# Patient Record
Sex: Male | Born: 1965 | Race: White | Hispanic: No | Marital: Married | Smoking: Never smoker
Health system: Southern US, Community
[De-identification: ages and names within clinical notes are randomized; demographics above are authoritative.]

## PROBLEM LIST (undated history)

## (undated) DIAGNOSIS — F419 Anxiety disorder, unspecified: Secondary | ICD-10-CM

## (undated) DIAGNOSIS — I10 Essential (primary) hypertension: Secondary | ICD-10-CM

## (undated) DIAGNOSIS — F418 Other specified anxiety disorders: Secondary | ICD-10-CM

## (undated) DIAGNOSIS — D696 Thrombocytopenia, unspecified: Secondary | ICD-10-CM

## (undated) DIAGNOSIS — J189 Pneumonia, unspecified organism: Secondary | ICD-10-CM

## (undated) DIAGNOSIS — F1010 Alcohol abuse, uncomplicated: Secondary | ICD-10-CM

## (undated) HISTORY — DX: Anxiety disorder, unspecified: F41.9

## (undated) HISTORY — PX: NO PAST SURGERIES: SHX2092

---

## 2016-01-22 ENCOUNTER — Inpatient Hospital Stay
Admission: EM | Admit: 2016-01-22 | Discharge: 2016-01-23 | DRG: 194 | Disposition: A | Discharge status: Home / Self Care | Payer: BLUE CROSS/BLUE SHIELD | Attending: Internal Medicine | Admitting: Internal Medicine

## 2016-01-22 ENCOUNTER — Emergency Department: Payer: BLUE CROSS/BLUE SHIELD

## 2016-01-22 ENCOUNTER — Encounter: Payer: Self-pay | Admitting: *Deleted

## 2016-01-22 DIAGNOSIS — Z79899 Other long term (current) drug therapy: Secondary | ICD-10-CM | POA: Diagnosis not present

## 2016-01-22 DIAGNOSIS — I160 Hypertensive urgency: Secondary | ICD-10-CM | POA: Diagnosis present

## 2016-01-22 DIAGNOSIS — F10230 Alcohol dependence with withdrawal, uncomplicated: Secondary | ICD-10-CM | POA: Diagnosis present

## 2016-01-22 DIAGNOSIS — Z23 Encounter for immunization: Secondary | ICD-10-CM

## 2016-01-22 DIAGNOSIS — W19XXXA Unspecified fall, initial encounter: Secondary | ICD-10-CM | POA: Diagnosis present

## 2016-01-22 DIAGNOSIS — F10939 Alcohol use, unspecified with withdrawal, unspecified: Secondary | ICD-10-CM | POA: Diagnosis present

## 2016-01-22 DIAGNOSIS — F17290 Nicotine dependence, other tobacco product, uncomplicated: Secondary | ICD-10-CM | POA: Diagnosis present

## 2016-01-22 DIAGNOSIS — S2242XA Multiple fractures of ribs, left side, initial encounter for closed fracture: Secondary | ICD-10-CM | POA: Diagnosis present

## 2016-01-22 DIAGNOSIS — I10 Essential (primary) hypertension: Secondary | ICD-10-CM | POA: Diagnosis present

## 2016-01-22 DIAGNOSIS — J189 Pneumonia, unspecified organism: Secondary | ICD-10-CM | POA: Diagnosis present

## 2016-01-22 DIAGNOSIS — R0602 Shortness of breath: Secondary | ICD-10-CM | POA: Diagnosis present

## 2016-01-22 DIAGNOSIS — F10930 Alcohol use, unspecified with withdrawal, uncomplicated: Secondary | ICD-10-CM | POA: Diagnosis present

## 2016-01-22 DIAGNOSIS — F10239 Alcohol dependence with withdrawal, unspecified: Secondary | ICD-10-CM | POA: Diagnosis present

## 2016-01-22 DIAGNOSIS — S2239XA Fracture of one rib, unspecified side, initial encounter for closed fracture: Secondary | ICD-10-CM | POA: Diagnosis present

## 2016-01-22 HISTORY — DX: Essential (primary) hypertension: I10

## 2016-01-22 LAB — COMPREHENSIVE METABOLIC PANEL
ALBUMIN: 4.6 g/dL (ref 3.5–5.0)
ALT: 66 U/L — ABNORMAL HIGH (ref 17–63)
ANION GAP: 10 (ref 5–15)
AST: 46 U/L — ABNORMAL HIGH (ref 15–41)
Alkaline Phosphatase: 73 U/L (ref 38–126)
BILIRUBIN TOTAL: 0.9 mg/dL (ref 0.3–1.2)
BUN: 5 mg/dL — ABNORMAL LOW (ref 6–20)
CHLORIDE: 99 mmol/L — AB (ref 101–111)
CO2: 26 mmol/L (ref 22–32)
Calcium: 9.1 mg/dL (ref 8.9–10.3)
Creatinine, Ser: 0.64 mg/dL (ref 0.61–1.24)
GFR calc Af Amer: >60 mL/min (ref >60)
Glucose, Bld: 113 mg/dL — ABNORMAL HIGH (ref 65–99)
POTASSIUM: 3.9 mmol/L (ref 3.5–5.1)
Sodium: 135 mmol/L (ref 135–145)
TOTAL PROTEIN: 8.2 g/dL — AB (ref 6.5–8.1)

## 2016-01-22 LAB — CBC WITH DIFFERENTIAL/PLATELET
BASOS ABS: 0.1 10*3/uL (ref 0–0.1)
BASOS PCT: 1 %
Eosinophils Absolute: 0.7 10*3/uL (ref 0–0.7)
Eosinophils Relative: 12 %
HEMATOCRIT: 43.4 % (ref 40.0–52.0)
HEMOGLOBIN: 15 g/dL (ref 13.0–18.0)
LYMPHS PCT: 20 %
Lymphs Abs: 1.1 10*3/uL (ref 1.0–3.6)
MCH: 31.5 pg (ref 26.0–34.0)
MCHC: 34.4 g/dL (ref 32.0–36.0)
MCV: 91.5 fL (ref 80.0–100.0)
Monocytes Absolute: 0.5 10*3/uL (ref 0.2–1.0)
Monocytes Relative: 9 %
NEUTROS ABS: 3.4 10*3/uL (ref 1.4–6.5)
NEUTROS PCT: 58 %
Platelets: 169 10*3/uL (ref 150–440)
RBC: 4.74 MIL/uL (ref 4.40–5.90)
RDW: 13.2 % (ref 11.5–14.5)
WBC: 5.8 10*3/uL (ref 3.8–10.6)

## 2016-01-22 LAB — URINE DRUG SCREEN, QUALITATIVE (ARMC ONLY)
Amphetamines, Ur Screen: NOT DETECTED
BARBITURATES, UR SCREEN: NOT DETECTED
BENZODIAZEPINE, UR SCRN: NOT DETECTED
CANNABINOID 50 NG, UR ~~LOC~~: NOT DETECTED
Cocaine Metabolite,Ur ~~LOC~~: NOT DETECTED
MDMA (Ecstasy)Ur Screen: NOT DETECTED
Methadone Scn, Ur: NOT DETECTED
Opiate, Ur Screen: NOT DETECTED
PHENCYCLIDINE (PCP) UR S: NOT DETECTED
TRICYCLIC, UR SCREEN: NOT DETECTED

## 2016-01-22 LAB — INFLUENZA PANEL BY PCR (TYPE A & B)
INFLAPCR: NEGATIVE
INFLBPCR: NEGATIVE

## 2016-01-22 LAB — TROPONIN I

## 2016-01-22 LAB — BRAIN NATRIURETIC PEPTIDE: B NATRIURETIC PEPTIDE 5: 14 pg/mL (ref 0.0–100.0)

## 2016-01-22 LAB — ETHANOL

## 2016-01-22 LAB — LACTIC ACID, PLASMA: Lactic Acid, Venous: 0.9 mmol/L (ref 0.5–1.9)

## 2016-01-22 MED ORDER — IPRATROPIUM-ALBUTEROL 0.5-2.5 (3) MG/3ML IN SOLN
3.0000 mL | Freq: Once | RESPIRATORY_TRACT | Status: AC
  Administered 2016-01-22: 3 mL via RESPIRATORY_TRACT
  Filled 2016-01-22: qty 3

## 2016-01-22 MED ORDER — ENOXAPARIN SODIUM 40 MG/0.4ML ~~LOC~~ SOLN
40.0000 mg | SUBCUTANEOUS | Status: DC
  Administered 2016-01-22: 40 mg via SUBCUTANEOUS
  Filled 2016-01-22: qty 0.4

## 2016-01-22 MED ORDER — LORAZEPAM 2 MG/ML IJ SOLN: 1.0000 mg | Freq: Four times a day (QID) | INTRAMUSCULAR | Status: DC | PRN

## 2016-01-22 MED ORDER — CEFTRIAXONE SODIUM-DEXTROSE 1-3.74 GM-% IV SOLR: 1.0000 g | INTRAVENOUS | Status: DC

## 2016-01-22 MED ORDER — IOPAMIDOL (ISOVUE-370) INJECTION 76%
75.0000 mL | Freq: Once | INTRAVENOUS | Status: AC | PRN
  Administered 2016-01-22: 75 mL via INTRAVENOUS

## 2016-01-22 MED ORDER — CEFTRIAXONE SODIUM-DEXTROSE 1-3.74 GM-% IV SOLR
1.0000 g | Freq: Once | INTRAVENOUS | Status: AC
  Administered 2016-01-22: 1 g via INTRAVENOUS
  Filled 2016-01-22: qty 50

## 2016-01-22 MED ORDER — ACETAMINOPHEN 325 MG PO TABS: 650.0000 mg | ORAL_TABLET | Freq: Four times a day (QID) | ORAL | Status: DC | PRN

## 2016-01-22 MED ORDER — DEXTROSE 5 % IV SOLN: 1.0000 g | INTRAVENOUS | Status: DC

## 2016-01-22 MED ORDER — ONDANSETRON HCL 4 MG/2ML IJ SOLN: 4.0000 mg | Freq: Four times a day (QID) | INTRAMUSCULAR | Status: DC | PRN

## 2016-01-22 MED ORDER — THIAMINE HCL 100 MG/ML IJ SOLN: 100.0000 mg | Freq: Every day | INTRAMUSCULAR | Status: DC

## 2016-01-22 MED ORDER — LORAZEPAM 2 MG PO TABS: 0.0000 mg | ORAL_TABLET | Freq: Two times a day (BID) | ORAL | Status: DC

## 2016-01-22 MED ORDER — ADULT MULTIVITAMIN W/MINERALS CH
1.0000 | ORAL_TABLET | Freq: Every day | ORAL | Status: DC
Start: 2016-01-22 — End: 2016-01-23
  Administered 2016-01-22 – 2016-01-23 (×2): 1 via ORAL
  Filled 2016-01-22 (×2): qty 1

## 2016-01-22 MED ORDER — IPRATROPIUM-ALBUTEROL 0.5-2.5 (3) MG/3ML IN SOLN
3.0000 mL | RESPIRATORY_TRACT | Status: DC
  Administered 2016-01-22 – 2016-01-23 (×3): 3 mL via RESPIRATORY_TRACT
  Filled 2016-01-22 (×4): qty 3

## 2016-01-22 MED ORDER — AZITHROMYCIN 250 MG PO TABS
250.0000 mg | ORAL_TABLET | Freq: Every day | ORAL | Status: DC
  Administered 2016-01-23: 250 mg via ORAL
  Filled 2016-01-22: qty 1

## 2016-01-22 MED ORDER — AZITHROMYCIN 500 MG IV SOLR
500.0000 mg | Freq: Once | INTRAVENOUS | Status: AC
  Administered 2016-01-22: 500 mg via INTRAVENOUS
  Filled 2016-01-22: qty 500

## 2016-01-22 MED ORDER — LORAZEPAM 1 MG PO TABS: 1.0000 mg | ORAL_TABLET | Freq: Four times a day (QID) | ORAL | Status: DC | PRN

## 2016-01-22 MED ORDER — ACETAMINOPHEN 650 MG RE SUPP: 650.0000 mg | Freq: Four times a day (QID) | RECTAL | Status: DC | PRN

## 2016-01-22 MED ORDER — OXYCODONE HCL 5 MG PO TABS: 5.0000 mg | ORAL_TABLET | ORAL | Status: DC | PRN

## 2016-01-22 MED ORDER — LORAZEPAM 2 MG/ML IJ SOLN
1.0000 mg | Freq: Once | INTRAMUSCULAR | Status: AC
  Administered 2016-01-22: 1 mg via INTRAVENOUS
  Filled 2016-01-22: qty 1

## 2016-01-22 MED ORDER — METHYLPREDNISOLONE SODIUM SUCC 125 MG IJ SOLR
125.0000 mg | Freq: Once | INTRAMUSCULAR | Status: AC
  Administered 2016-01-22: 125 mg via INTRAVENOUS
  Filled 2016-01-22: qty 2

## 2016-01-22 MED ORDER — VITAMIN B-1 100 MG PO TABS
100.0000 mg | ORAL_TABLET | Freq: Every day | ORAL | Status: DC
Start: 2016-01-22 — End: 2016-01-23
  Administered 2016-01-22 – 2016-01-23 (×2): 100 mg via ORAL
  Filled 2016-01-22 (×3): qty 1

## 2016-01-22 MED ORDER — ONDANSETRON HCL 4 MG PO TABS: 4.0000 mg | ORAL_TABLET | Freq: Four times a day (QID) | ORAL | Status: DC | PRN

## 2016-01-22 MED ORDER — LORAZEPAM 2 MG/ML IJ SOLN
1.0000 mg | Freq: Once | INTRAMUSCULAR | Status: AC
Start: 2016-01-22 — End: 2016-01-22
  Administered 2016-01-22: 1 mg via INTRAVENOUS
  Filled 2016-01-22: qty 1

## 2016-01-22 MED ORDER — FOLIC ACID 1 MG PO TABS
1.0000 mg | ORAL_TABLET | Freq: Every day | ORAL | Status: DC
  Administered 2016-01-22 – 2016-01-23 (×2): 1 mg via ORAL
  Filled 2016-01-22 (×2): qty 1

## 2016-01-22 MED ORDER — SODIUM CHLORIDE 0.9% FLUSH
3.0000 mL | Freq: Two times a day (BID) | INTRAVENOUS | Status: DC
  Administered 2016-01-22 – 2016-01-23 (×3): 3 mL via INTRAVENOUS

## 2016-01-22 MED ORDER — LORAZEPAM 2 MG PO TABS
0.0000 mg | ORAL_TABLET | Freq: Four times a day (QID) | ORAL | Status: DC
Start: 2016-01-22 — End: 2016-01-23
  Administered 2016-01-23: 1 mg via ORAL
  Filled 2016-01-22: qty 1

## 2016-01-22 MED ORDER — INFLUENZA VAC SPLIT QUAD 0.5 ML IM SUSY
0.5000 mL | PREFILLED_SYRINGE | INTRAMUSCULAR | Status: AC
  Administered 2016-01-23: 0.5 mL via INTRAMUSCULAR
  Filled 2016-01-22: qty 0.5

## 2016-01-22 MED ORDER — HYDRALAZINE HCL 20 MG/ML IJ SOLN: 10.0000 mg | INTRAMUSCULAR | Status: DC | PRN

## 2016-01-22 MED ORDER — AMLODIPINE BESYLATE 5 MG PO TABS
5.0000 mg | ORAL_TABLET | Freq: Every day | ORAL | Status: DC
  Administered 2016-01-22 – 2016-01-23 (×2): 5 mg via ORAL
  Filled 2016-01-22 (×2): qty 1

## 2016-01-22 MED ORDER — LORAZEPAM 2 MG/ML IJ SOLN
0.5000 mg | Freq: Once | INTRAMUSCULAR | Status: AC
Start: 2016-01-22 — End: 2016-01-22
  Administered 2016-01-22: 0.5 mg via INTRAVENOUS
  Filled 2016-01-22: qty 1

## 2016-01-22 MED ORDER — METHYLPREDNISOLONE SODIUM SUCC 125 MG IJ SOLR
60.0000 mg | INTRAMUSCULAR | Status: DC
  Administered 2016-01-23: 60 mg via INTRAVENOUS
  Filled 2016-01-22: qty 2

## 2016-01-22 MED ORDER — DEXTROSE 5 % IV SOLN: 1.0000 g | Freq: Once | INTRAVENOUS | Status: DC

## 2016-01-22 NOTE — ED Notes (Signed)
Patient denies pain and is resting comfortably.  

## 2016-01-22 NOTE — H&P (Signed)
[facility] [place] [facility]   PATIENT NAME: Bobby Williams    MR#:  [id number]  DATE OF BIRTH:  1965-02-28   DATE OF ADMISSION:  01/22/2016  PRIMARY CARE PHYSICIAN: No PCP Per Patient   REQUESTING/REFERRING PHYSICIAN: Governor Rooks  CHIEF COMPLAINT:   Chief Complaint  Patient presents with  . Shortness of Breath    HISTORY OF PRESENT ILLNESS:  Bobby Williams  is a 50 y.o. male with a known history of Essential hypertension who is presenting with shortness of breath. She describes having about 1 week duration shortness of breath mainly dyspnea on exertion has been progressive. Associated cough nonproductive denies fevers chills was sent to pathology. He states about 2 months ago did suffer a mechanical fall with rib fracture on the left side.  PAST MEDICAL HISTORY:   Past Medical History:  Diagnosis Date  . Hypertension     PAST SURGICAL HISTORY:  History reviewed. No pertinent surgical history.  SOCIAL HISTORY:   Social History  Substance Use Topics  . Smoking status: Never Smoker  . Smokeless tobacco: Current User  . Alcohol use No    FAMILY HISTORY:   Family History  Problem Relation Age of Onset  . Diabetes Neg Hx     DRUG ALLERGIES:  No Known Allergies  REVIEW OF SYSTEMS:  REVIEW OF SYSTEMS:  CONSTITUTIONAL: Denies fevers, chills, Positive fatigue, weakness.  EYES: Denies blurred vision, double vision, or eye pain.  EARS, NOSE, THROAT: Denies tinnitus, ear pain, hearing loss.  RESPIRATORY: Positive cough, shortness of breath, denies wheezing  CARDIOVASCULAR: Denies chest pain, palpitations, edema.  GASTROINTESTINAL: Denies nausea, vomiting, diarrhea, abdominal pain.  GENITOURINARY: Denies dysuria, hematuria.  ENDOCRINE: Denies nocturia or thyroid problems. HEMATOLOGIC AND LYMPHATIC: Denies easy bruising or bleeding.  SKIN: Denies rash or lesions.  MUSCULOSKELETAL: Denies pain in neck, back, shoulder, knees, hips, or further  arthritic symptoms.  NEUROLOGIC: Denies paralysis, paresthesias.  PSYCHIATRIC: Denies anxiety or depressive symptoms. Otherwise full review of systems performed by me is negative.   MEDICATIONS AT HOME:   Prior to Admission medications   Medication Sig Start Date End Date Taking? Authorizing Provider  albuterol (PROVENTIL HFA;VENTOLIN HFA) 108 (90 Base) MCG/ACT inhaler Inhale 1-2 puffs into the lungs every 4 (four) hours as needed for wheezing or shortness of breath.   Yes Historical Provider, MD  guaiFENesin (MUCINEX) 600 MG 12 hr tablet Take by mouth 2 (two) times daily.   Yes Historical Provider, MD      VITAL SIGNS:  Blood pressure (!) 178/109, pulse (!) 110, temperature 98.2 F (36.8 C), temperature source Oral, resp. rate (!) 22, height 6\' 4"  (1.93 m), weight 99.8 kg (220 lb), SpO2 95 %.  PHYSICAL EXAMINATION:  VITAL SIGNS: Vitals:   01/22/16 1100 01/22/16 1326  BP: (!) 193/104 (!) 178/109  Pulse: (!) 127 (!) 110  Resp: (!) 33 (!) 22  Temp:     GENERAL:50 y.o.male currently in no acute distress.  HEAD: Normocephalic, atraumatic.  EYES: Pupils equal, round, reactive to light. Extraocular muscles intact. No scleral icterus.  MOUTH: Moist mucosal membrane. Dentition intact. No abscess noted.  EAR, NOSE, THROAT: Clear without exudates. No external lesions.  NECK: Supple. No thyromegaly. No nodules. No JVD.  PULMONARY: Coarse breath sounds scattered rhonchi of the left lung fields without wheeze No use of accessory muscles, Good respiratory effort. good air entry bilaterally CHEST: Nontender to palpation.  CARDIOVASCULAR: S1 and S2. Tachycardic No murmurs, rubs, or gallops. No edema.  Pedal pulses 2+ bilaterally.  GASTROINTESTINAL: Soft, nontender, nondistended. No masses. Positive bowel sounds. No hepatosplenomegaly.  MUSCULOSKELETAL: No swelling, clubbing, or edema. Range of motion full in all extremities.  NEUROLOGIC: Cranial nerves II through XII are intact. No gross focal  neurological deficits. Sensation intact. Reflexes intact. Tremulous SKIN: No ulceration, lesions, rashes, or cyanosis. Skin warm and dry. Turgor intact.  PSYCHIATRIC: Mood, affect within normal limits. The patient is awake, alert and oriented x 3. Insight, judgment intact.    LABORATORY PANEL:   CBC  Recent Labs Lab 01/22/16 0844  WBC 5.8  HGB 15.0  HCT 43.4  PLT 169   ------------------------------------------------------------------------------------------------------------------  Chemistries   Recent Labs Lab 01/22/16 0844  NA 135  K 3.9  CL 99*  CO2 26  GLUCOSE 113*  BUN 5*  CREATININE 0.64  CALCIUM 9.1  AST 46*  ALT 66*  ALKPHOS 73  BILITOT 0.9   ------------------------------------------------------------------------------------------------------------------  Cardiac Enzymes  Recent Labs Lab 01/22/16 0844  TROPONINI <0.03   ------------------------------------------------------------------------------------------------------------------  RADIOLOGY:  Dg Chest 2 View  Result Date: 01/22/2016 CLINICAL DATA:  Cold symptoms for 1 month. Worsening shortness of breath. EXAM: CHEST  2 VIEW COMPARISON:  None. FINDINGS: The heart size and mediastinal contours are normal. There is minimal central airway thickening. No hyperinflation, confluent airspace opacity, pleural effusion or pneumothorax demonstrated. There are bilateral nipple shadows. The bones appear unremarkable. IMPRESSION: Minimal central airway thickening suggesting bronchitis. No evidence of pneumonia. Electronically Signed   By: Carey BullocksWilliam  Veazey M.D.   On: 01/22/2016 08:20   Ct Angio Chest Pe W/cm &/or Wo Cm  Result Date: 01/22/2016 CLINICAL DATA:  Shortness of breath and cough EXAM: CT ANGIOGRAPHY CHEST WITH CONTRAST TECHNIQUE: Multidetector CT imaging of the chest was performed using the standard protocol during bolus administration of intravenous contrast. Multiplanar CT image reconstructions and  MIPs were obtained to evaluate the vascular anatomy. CONTRAST:  75 mL Isovue 370 nonionic COMPARISON:  Chest radiograph January 22, 2016 FINDINGS: Cardiovascular: There is no demonstrable pulmonary embolus. There is no thoracic aortic aneurysm or dissection. Pericardium is not appreciably thickened. Visualized great vessels appear unremarkable. There are scattered foci of coronary artery calcification. Mediastinum/Nodes: There is a benign-appearing calcification in the left lobe of the thyroid measuring 7 x 6 mm. Visualized thyroid otherwise appears unremarkable. There are several subcentimeter mediastinal lymph nodes. By size criteria, there is no frank adenopathy the. Lungs/Pleura: There is patchy infiltrate throughout much of the left lower lobe. Several areas of tree-in-bud type appearance is noted in this area. Lungs elsewhere are clear. There is no pleural effusion or pleural thickening. No pneumothorax. Upper abdomen: Visualized upper abdominal structures appear unremarkable. Musculoskeletal: There are recent appearing fractures of the lateral left eighth, ninth, and tenth ribs, nondisplaced. A bony structures appear intact. No blastic or lytic bone lesions. Review of the MIP images confirms the above findings. IMPRESSION: Patchy infiltrate throughout much of the left lower lobe. Recent appearing fractures of the left lateral eighth, ninth, and tenth ribs. No pneumothorax. No demonstrable pulmonary embolus. No apparent adenopathy. There are subcentimeter mediastinal lymph nodes, but no lymph nodes meeting size criteria for pathologic significance. Benign-appearing calcification noted in left thyroid lobe. Electronically Signed   By: Bretta BangWilliam  Woodruff III M.D.   On: 01/22/2016 11:55    EKG:   Orders placed or performed during the hospital encounter of 01/22/16  . ED EKG  . ED EKG  . EKG 12-Lead  . EKG 12-Lead    IMPRESSION AND PLAN:  50 year old Caucasian gentleman history of essential  hypertension presenting with shortness of breath  1. Community acquired pneumonia: Oxygen, breathing treatments, azithromycin/ceftriaxone this is the setting of lung fracture may be pulmonary contusion element, add incentive spirometry  2. Alcohol abuse with withdrawal: Initiate CIWA protocol 3. Hypertensive urgency: Add IV hydralazine    All the records are reviewed and case discussed with ED provider. Management plans discussed with the patient, family and they are in agreement.  CODE STATUS: Full  TOTAL TIME TAKING CARE OF THIS PATIENT: 33 minutes.    Hower,  Mardi MainlandDavid K M.D on 01/22/2016 at 2:30 PM  Between 7am to 6pm - Pager - [phone]  After 6pm: House Pager: - [phone]  [facility]  Office  [phone]  CC: Primary care physician; No PCP Per Patient

## 2016-01-22 NOTE — ED Notes (Signed)
edp at bedside for update.

## 2016-01-22 NOTE — ED Notes (Signed)
Pt was seen by online md for symptoms and given meds via online.

## 2016-01-22 NOTE — Progress Notes (Signed)
CH responded to an OR for AD Education. PT is young and healthy but wants to have documentation in place should something ever happen. CH provided packet and encouraged PT to look through it with his spouse. CH provided conversation and education. Follow-up as needed.

## 2016-01-22 NOTE — ED Provider Notes (Signed)
[facility] Emergency Department Provider Note ____________________________________________   I have reviewed the triage vital signs and the triage nursing note.  HISTORY  Chief Complaint Shortness of Breath   Historian Patient  HPI Bobby Williams is a 50 y.o. male who has not been to a doctor in about a decade, states he dips tobacco but does not smoke cigarettes, or have history of cardiac or pulmonary disease, presents today due to about 2 weeks of cough and shortness of breath.  States did tele-physician about 2 weeks ago and tried nasal spray and sudafed type meds, and then called back and did a week of amoxicillin, completed about 5 days ago.  He's continued to worsen in terms of feeling short of breath especially with exertion and with coughing. Hasn't increased coughing. He tried his kids albuterol nebulizer overnight and had a little bit improvement. He states he's had soreness and points to both sides of his chest which she suspects is from coughing. Denies abdominal pain, nausea, or diarrhea. Denies fevers.  Sounds like he has a history of routine alcohol drinking, states that this weekend was quite heavy.  When I asked him if his trembling is normal at 51 thinks it might be withdrawal, he does indeed state it might be withdrawal.    Past Medical History:  Diagnosis Date  . Hypertension    Has not seen a doctor in over a decade.  There are no active problems to display for this patient.   History reviewed. No pertinent surgical history.  Prior to Admission medications   Not on File    No Known Allergies  No family history on file.  Social History Social History  Substance Use Topics  . Smoking status: Never Smoker  . Smokeless tobacco: Current User  . Alcohol use No    Review of Systems  Constitutional: Negative for fever. Eyes: Negative for visual changes. ENT: Negative for sore throat.  Mild nasal congestion. Cardiovascular:  Negative for pleuritic chest pain, but chest soreness both sides. Respiratory: Positive for shortness of breath. Gastrointestinal: Negative for abdominal pain, vomiting and diarrhea. Genitourinary: Negative for dysuria. Musculoskeletal: Negative for back pain. Skin: Negative for rash. Neurological: Negative for headache. 10 point Review of Systems otherwise negative ____________________________________________   PHYSICAL EXAM:  VITAL SIGNS: ED Triage Vitals  Enc Vitals Group     BP 01/22/16 0722 (!) 188/88     Pulse Rate 01/22/16 0722 (!) 116     Resp 01/22/16 0722 (!) 26     Temp 01/22/16 0722 98.2 F (36.8 C)     Temp Source 01/22/16 0722 Oral     SpO2 01/22/16 0722 94 %     Weight 01/22/16 0723 220 lb (99.8 kg)     Height 01/22/16 0723 6\' 4"  (1.93 m)     Head Circumference --      Peak Flow --      Pain Score --      Pain Loc --      Pain Edu? --      Excl. in GC? --      Constitutional: Alert and oriented. Trembling a little bit when he speaks in his arms had. HEENT   Head: Normocephalic and atraumatic.      Eyes: Conjunctivae are normal. PERRL. Normal extraocular movements.      Ears:         Nose: No congestion/rhinnorhea.   Mouth/Throat: Mucous membranes are moist.  No posterior pharyngeal erythema.  Neck: No stridor. Cardiovascular/Chest: Tachycardic, regular rhythm.  No murmurs, rubs, or gallops. Respiratory: Normal respiratory effort without tachypnea nor retractions. Somewhat decreased breath sounds throughout without rhonchi or obvious wheezing except for if he takes a deep breath in which case he has a bronchospastic cough. Gastrointestinal: Soft. No distention, no guarding, no rebound. Nontender.    Genitourinary/rectal:Deferred Musculoskeletal: Nontender with normal range of motion in all extremities. No joint effusions.  No lower extremity tenderness.  No edema. Neurologic:  Normal speech and language. No gross or focal neurologic deficits are  appreciated. Skin:  Skin is warm, dry and intact. No rash noted. Psychiatric: Mood and affect are normal. Speech and behavior are normal. Patient exhibits appropriate insight and judgment.   ____________________________________________  LABS (pertinent positives/negatives)  Labs Reviewed  COMPREHENSIVE METABOLIC PANEL - Abnormal; Notable for the following:       Result Value   Chloride 99 (*)    Glucose, Bld 113 (*)    BUN 5 (*)    Total Protein 8.2 (*)    AST 46 (*)    ALT 66 (*)    All other components within normal limits  CULTURE, BLOOD (ROUTINE X 2)  CULTURE, BLOOD (ROUTINE X 2)  TROPONIN I  CBC WITH DIFFERENTIAL/PLATELET  LACTIC ACID, PLASMA  BRAIN NATRIURETIC PEPTIDE  INFLUENZA PANEL BY PCR (TYPE A & B, H1N1)  ETHANOL    ____________________________________________    EKG I, Governor Rooksebecca Kweku Stankey, MD, the attending physician have personally viewed and interpreted all ECGs.  103bpm.  Sinus tachy.  Narrow qrs.  Normal axis.  Normal st and t w ____________________________________________  RADIOLOGY All Xrays were viewed by me. Imaging interpreted by Radiologist.  Chest x-ray two-view:  IMPRESSION: Minimal central airway thickening suggesting bronchitis. No evidence of pneumonia.  CT chest with contrast for PE:  IMPRESSION: Patchy infiltrate throughout much of the left lower lobe. Recent appearing fractures of the left lateral eighth, ninth, and tenth ribs. No pneumothorax.  No demonstrable pulmonary embolus. No apparent adenopathy. There are subcentimeter mediastinal lymph nodes, but no lymph nodes meeting size criteria for pathologic significance. Benign-appearing calcification noted in left thyroid lobe.   __________________________________________  PROCEDURES  Procedure(s) performed: None  Critical Care performed: None  ____________________________________________   ED COURSE / ASSESSMENT AND PLAN  Pertinent labs & imaging results that were  available during my care of the patient were reviewed by me and considered in my medical decision making (see chart for details).   Bobby Williams is here for shortness of breath and cough and has bronchospasm on exam. Symptoms have been worsening over a period of about 2 weeks now. Of note, he is trembling and appears to be like he may have symptoms consistent with alcohol withdrawal.  He thinks it's possible, I discussed giving him a dose of Ativan right now.  In terms of the bronchospasm, I'm going to give him a DuoNeb treatment, and a workup for shortness of breath what I suspect to be probably pulmonary/upper respiratory infection with bronchospasm, but given that he has not been followed by a doctor in quite some time, will certainly also evaluate for cardiac causes shortness of breath as well.   Reeval 11am- still short of breath and very bronchospastic.  I suspect bronchitis and bronchospasm as most likely cause.  However, recommend r/o pe with CT.  Still tachy around 100s.   Question this due to withdrawal or breathing issues.  He states that by far its the breathing issues that are the  problem.  As such, will do ct pe, additional duoneb, solumedrol and likely add antibiotic with sputum production here really thick and yellow.  CT scan shows no PE, but infiltrates in the left lung, and given his cough, sputum production, and dyspnea I am to cover him for community acquired pneumonia with Rocephin and azithromycin. At this point suspicion for sepsis is low with no hypotension or elevated lactate, however he does have elevated heart rate that I suspect is more due to alcohol withdrawal, but will place on sepsis pathway for possible SIRS.  The acute appearing fractures were discussed with the patient and he states he actually had a fall 3 months ago, but he thought they were healed and then during the coughing fits here over the last week or so they seem to be reinjured.  O2 sat 92% at the  lowest, but given the tachycardia, persistent symptoms, I did discuss with hospitalist for admission.  History of trembling, with elevated heart rate I am going to give him another milligram of Ativan for suspected symptoms in association with withdrawal from alcohol.   CONSULTATIONS:  Hospitalist for admission.   Patient / Family / Caregiver informed of clinical course, medical decision-making process, and agree with plan.     ___________________________________________   FINAL CLINICAL IMPRESSION(S) / ED DIAGNOSES   Final diagnoses:  Multiple fractures of ribs, left side, initial encounter for closed fracture  Alcohol withdrawal syndrome without complication (HCC)  Community acquired pneumonia of left lung, unspecified part of lung              Note: This dictation was prepared with Office managerDragon dictation. Any transcriptional errors that result from this process are unintentional    Governor Rooksebecca Lilyauna Miedema, MD 01/22/16 [phone]

## 2016-01-22 NOTE — ED Triage Notes (Signed)
States cold symptoms for 1 month, states he took sudafed and abx but states no relief, states worsening SOB and feels as if it is in his lungs, states cream productive cough

## 2016-01-22 NOTE — ED Notes (Signed)
Wife at bedside. Awaiting CT results.

## 2016-01-23 MED ORDER — LEVOFLOXACIN 500 MG PO TABS: 500.0000 mg | ORAL_TABLET | Freq: Every day | ORAL | 0 refills | Status: DC

## 2016-01-23 MED ORDER — ALBUTEROL SULFATE HFA 108 (90 BASE) MCG/ACT IN AERS: 1.0000 | INHALATION_SPRAY | RESPIRATORY_TRACT | 0 refills | Status: DC | PRN

## 2016-01-23 MED ORDER — IPRATROPIUM-ALBUTEROL 0.5-2.5 (3) MG/3ML IN SOLN: 3.0000 mL | RESPIRATORY_TRACT | Status: DC | PRN

## 2016-01-23 MED ORDER — PREMIER PROTEIN SHAKE: 11.0000 [oz_av] | Freq: Two times a day (BID) | ORAL | Status: DC

## 2016-01-23 MED ORDER — AMLODIPINE BESYLATE 5 MG PO TABS: 5.0000 mg | ORAL_TABLET | Freq: Every day | ORAL | 0 refills | Status: DC

## 2016-01-23 NOTE — Progress Notes (Signed)
   [place] [facility] [address] [place], [place] [address]  January 23, 2016  Patient:  Bobby Williams Date of Birth: 12/30/1965 Date of Visit:  01/22/2016  To Whom it May Concern:  Please excuse Ria BushMark C Cooter from work from 01/22/2016 until 01/23/16 as he was admitted to the [facility] for medical treatment and has been receiving appropriate care. He may return to work on 01/25/16, sooner if he feels he is able to return sooner than this date.      Please don't hesitate to contact me with questions or concerns by calling  [phone] and asking them to page me directly.   Marge Duncansave Christen Bedoya, MD

## 2016-01-23 NOTE — Progress Notes (Signed)
Initial Nutrition Assessment  DOCUMENTATION CODES:   Not applicable  INTERVENTION:   Premier Protein BID- each serving provides 160kcal and 30g protein.  NUTRITION DIAGNOSIS:   Inadequate oral intake related to poor appetite as evidenced by per patient/family report.  GOAL:   Patient will meet greater than or equal to 90% of their needs  MONITOR:   PO intake, Supplement acceptance  REASON FOR ASSESSMENT:   Malnutrition Screening Tool    ASSESSMENT:   50 y.o. male who has not been to a doctor in about a decade, states he dips tobacco but does not smoke cigarettes, or have history of cardiac or pulmonary disease, presents today due to about 2 weeks of cough and shortness of breath. Now admitted for pneumonia.    Met with pt in room today. Pt reports poor appetite and PO intake for 1 month pta. Pt currently eating 75% meals. Pt reports that he had intentionally lost from 260lbs down to 240lbs; but then he lost 10 more pounds in setting of pneumonia. Pt reports that he has lost 10lbs(4%) over the past month. This is not significant. Talked to pt about the importance of adequate protein intake during times of illness. Will order PP per pt request.   Medications reviewed and include: zithromax, ceftriaxone, folic acid, lovenox, MVI, thiamine   Labs reviewed: Cl 99(L), BUN 5(L), AST 46(H), ALT 66(H)  Nutrition-Focused physical exam completed. Findings are no fat depletion, no muscle depletion, and no edema.   Diet Order:  Diet regular Room service appropriate? Yes; Fluid consistency: Thin  Skin:  Reviewed, no issues  Last BM:  12/26  Height:   Ht Readings from Last 1 Encounters:  01/22/16 '6\' 4"'$  (1.93 m)    Weight:   Wt Readings from Last 1 Encounters:  01/22/16 231 lb 12.8 oz (105.1 kg)    Ideal Body Weight:  91.8 kg  BMI:  Body mass index is 28.22 kg/m.  Estimated Nutritional Needs:   Kcal:  2300-2700kcal/day   Protein:  105-126g/day   Fluid:  >2.3L/day    EDUCATION NEEDS:   No education needs identified at this time  Koleen Distance, RD, LDN Pager #[phone]

## 2016-01-23 NOTE — Progress Notes (Signed)
Pt is being discharged home. Discharge papers given and explained to pt. Pt verbalized understanding. Meds and F/U appointment reviewed with pt. Pt declined to be escorted on a wheelchair.

## 2016-01-23 NOTE — Care Management (Signed)
Admitted to [facility] with the diagnosis of alcohol withdrawal. Lives with wife, Bobby Williams [phone]). Works at [employer].Independent of all basic activities of daily living.  Private insurance. No primary care physician. States he calls [facility] through his insurance and talks to a physician, if needed.  List of physicians accepting new patients given to Bobby Williams. Gwenette GreetBrenda S Lennis Korb RN MSN CCM Care Management

## 2016-01-23 NOTE — Discharge Summary (Signed)
[facility] - [place] [facility]   PATIENT NAME: Bobby PhiMark Williams    MR#:  [id number]  DATE OF BIRTH:  Jan 25, 1966  DATE OF ADMISSION:  01/22/2016 ADMITTING PHYSICIAN: Wyatt Hasteavid K Mattisen Pohlmann, MD  DATE OF DISCHARGE: 01/23/16  PRIMARY CARE PHYSICIAN: No PCP Per Patient    ADMISSION DIAGNOSIS:  Alcohol withdrawal syndrome without complication (HCC) [F10.230] Multiple fractures of ribs, left side, initial encounter for closed fracture [S22.42XA] Community acquired pneumonia of left lung, unspecified part of lung [J18.9]  DISCHARGE DIAGNOSIS:  Active Problems:   Alcohol withdrawal (HCC)   Rib fracture   CAP (community acquired pneumonia)   SECONDARY DIAGNOSIS:   Past Medical History:  Diagnosis Date  . Hypertension     HOSPITAL COURSE:  Bobby PhiMark Martha  is a 50 y.o. male admitted 01/22/2016 with chief complaint Shortness of Breath . Please see H&P performed by Wyatt Hasteavid K Malaijah Houchen, MD for further information. Patient presented with shortness of breath. Found to have pneumonia on cxr in addition to left sided rib fractures. He was also noted to have elevated blood pressure. Improvement after antibiotics, breathing treatments. Patient placed on CIWA in hospital without issues  DISCHARGE CONDITIONS:   stable  CONSULTS OBTAINED:  Treatment Team:  Wyatt Hasteavid K Justiss Gerbino, MD  DRUG ALLERGIES:  No Known Allergies  DISCHARGE MEDICATIONS:   Current Discharge Medication List    START taking these medications   Details  amLODipine (NORVASC) 5 MG tablet Take 1 tablet (5 mg total) by mouth daily. Qty: 30 tablet, Refills: 0    levofloxacin (LEVAQUIN) 500 MG tablet Take 1 tablet (500 mg total) by mouth daily. Qty: 4 tablet, Refills: 0      CONTINUE these medications which have CHANGED   Details  albuterol (PROVENTIL HFA;VENTOLIN HFA) 108 (90 Base) MCG/ACT inhaler Inhale 1-2 puffs into the lungs every 4 (four) hours as needed for wheezing or shortness of breath. Qty: 1 Inhaler,  Refills: 0      CONTINUE these medications which have NOT CHANGED   Details  guaiFENesin (MUCINEX) 600 MG 12 hr tablet Take by mouth 2 (two) times daily.         DISCHARGE INSTRUCTIONS:    DIET:  Regular diet and Cardiac diet  DISCHARGE CONDITION:  Stable  ACTIVITY:  Activity as tolerated  OXYGEN:  Home Oxygen: No.   Oxygen Delivery: room air  DISCHARGE LOCATION:  home   If you experience worsening of your admission symptoms, develop shortness of breath, life threatening emergency, suicidal or homicidal thoughts you must seek medical attention immediately by calling 911 or calling your MD immediately  if symptoms less severe.  You Must read complete instructions/literature along with all the possible adverse reactions/side effects for all the Medicines you take and that have been prescribed to you. Take any new Medicines after you have completely understood and accpet all the possible adverse reactions/side effects.   Please note  You were cared for by a hospitalist during your hospital stay. If you have any questions about your discharge medications or the care you received while you were in the hospital after you are discharged, you can call the unit and asked to speak with the hospitalist on call if the hospitalist that took care of you is not available. Once you are discharged, your primary care physician will handle any further medical issues. Please note that NO REFILLS for any discharge medications will be authorized once you are discharged, as it is imperative that you return to your  primary care physician (or establish a relationship with a primary care physician if you do not have one) for your aftercare needs so that they can reassess your need for medications and monitor your lab values.    On the day of Discharge:   VITAL SIGNS:  Blood pressure (!) 145/94, pulse 98, temperature 98.9 F (37.2 C), temperature source Oral, resp. rate 18, height 6\' 4"  (1.93 m),  weight 105.1 kg (231 lb 12.8 oz), SpO2 96 %.  I/O:   Intake/Output Summary (Last 24 hours) at 01/23/16 1116 Last data filed at 01/22/16 2326  Gross per 24 hour  Intake              490 ml  Output              700 ml  Net             -210 ml    PHYSICAL EXAMINATION:  GENERAL:  50 y.o.-year-old patient lying in the bed with no acute distress.  EYES: Pupils equal, round, reactive to light and accommodation. No scleral icterus. Extraocular muscles intact.  HEENT: Head atraumatic, normocephalic. Oropharynx and nasopharynx clear.  NECK:  Supple, no jugular venous distention. No thyroid enlargement, no tenderness.  LUNGS: Normal breath sounds bilaterally, no wheezing, rales,rhonchi or crepitation. No use of accessory muscles of respiration.  CARDIOVASCULAR: S1, S2 normal. No murmurs, rubs, or gallops.  ABDOMEN: Soft, non-tender, non-distended. Bowel sounds present. No organomegaly or mass.  EXTREMITIES: No pedal edema, cyanosis, or clubbing.  NEUROLOGIC: Cranial nerves II through XII are intact. Muscle strength 5/5 in all extremities. Sensation intact. Gait not checked.  PSYCHIATRIC: The patient is alert and oriented x 3.  SKIN: No obvious rash, lesion, or ulcer.   DATA REVIEW:   CBC  Recent Labs Lab 01/22/16 0844  WBC 5.8  HGB 15.0  HCT 43.4  PLT 169    Chemistries   Recent Labs Lab 01/22/16 0844  NA 135  K 3.9  CL 99*  CO2 26  GLUCOSE 113*  BUN 5*  CREATININE 0.64  CALCIUM 9.1  AST 46*  ALT 66*  ALKPHOS 73  BILITOT 0.9    Cardiac Enzymes  Recent Labs Lab 01/22/16 0844  TROPONINI <0.03    Microbiology Results  Results for orders placed or performed during the hospital encounter of 01/22/16  Culture, blood (routine x 2)     Status: None (Preliminary result)   Collection Time: 01/22/16  8:37 AM  Result Value Ref Range Status   Specimen Description BLOOD LEFT HAND  Final   Special Requests BOTTLES DRAWN AEROBIC AND ANAEROBIC  10CC  Final   Culture NO  GROWTH < 24 HOURS  Final   Report Status PENDING  Incomplete  Culture, blood (routine x 2)     Status: None (Preliminary result)   Collection Time: 01/22/16  8:44 AM  Result Value Ref Range Status   Specimen Description BLOOD RIGHT HAND  Final   Special Requests BOTTLES DRAWN AEROBIC AND ANAEROBIC  10CC, 8CC  Final   Culture NO GROWTH < 24 HOURS  Final   Report Status PENDING  Incomplete    RADIOLOGY:  Dg Chest 2 View  Result Date: 01/22/2016 CLINICAL DATA:  Cold symptoms for 1 month. Worsening shortness of breath. EXAM: CHEST  2 VIEW COMPARISON:  None. FINDINGS: The heart size and mediastinal contours are normal. There is minimal central airway thickening. No hyperinflation, confluent airspace opacity, pleural effusion or pneumothorax demonstrated. There are bilateral nipple  shadows. The bones appear unremarkable. IMPRESSION: Minimal central airway thickening suggesting bronchitis. No evidence of pneumonia. Electronically Signed   By: Carey BullocksWilliam  Veazey M.D.   On: 01/22/2016 08:20   Ct Angio Chest Pe W/cm &/or Wo Cm  Result Date: 01/22/2016 CLINICAL DATA:  Shortness of breath and cough EXAM: CT ANGIOGRAPHY CHEST WITH CONTRAST TECHNIQUE: Multidetector CT imaging of the chest was performed using the standard protocol during bolus administration of intravenous contrast. Multiplanar CT image reconstructions and MIPs were obtained to evaluate the vascular anatomy. CONTRAST:  75 mL Isovue 370 nonionic COMPARISON:  Chest radiograph January 22, 2016 FINDINGS: Cardiovascular: There is no demonstrable pulmonary embolus. There is no thoracic aortic aneurysm or dissection. Pericardium is not appreciably thickened. Visualized great vessels appear unremarkable. There are scattered foci of coronary artery calcification. Mediastinum/Nodes: There is a benign-appearing calcification in the left lobe of the thyroid measuring 7 x 6 mm. Visualized thyroid otherwise appears unremarkable. There are several subcentimeter  mediastinal lymph nodes. By size criteria, there is no frank adenopathy the. Lungs/Pleura: There is patchy infiltrate throughout much of the left lower lobe. Several areas of tree-in-bud type appearance is noted in this area. Lungs elsewhere are clear. There is no pleural effusion or pleural thickening. No pneumothorax. Upper abdomen: Visualized upper abdominal structures appear unremarkable. Musculoskeletal: There are recent appearing fractures of the lateral left eighth, ninth, and tenth ribs, nondisplaced. A bony structures appear intact. No blastic or lytic bone lesions. Review of the MIP images confirms the above findings. IMPRESSION: Patchy infiltrate throughout much of the left lower lobe. Recent appearing fractures of the left lateral eighth, ninth, and tenth ribs. No pneumothorax. No demonstrable pulmonary embolus. No apparent adenopathy. There are subcentimeter mediastinal lymph nodes, but no lymph nodes meeting size criteria for pathologic significance. Benign-appearing calcification noted in left thyroid lobe. Electronically Signed   By: Bretta BangWilliam  Woodruff III M.D.   On: 01/22/2016 11:55     Management plans discussed with the patient, family and they are in agreement.  CODE STATUS:     Code Status Orders        Start     Ordered   01/22/16 1356  Full code  Continuous     01/22/16 1355    Code Status History    Date Active Date Inactive Code Status Order ID Comments User Context   This patient has a current code status but no historical code status.      TOTAL TIME TAKING CARE OF THIS PATIENT: 33 minutes.    Jeromiah Ohalloran,  Mardi MainlandDavid K M.D on 01/23/2016 at 11:16 AM  Between 7am to 6pm - Pager - [phone]  After 6pm go to [email or web address] - Scientist, research (life sciences)password [email or web address]  [facility] [place] [facility]  Office  [phone]  CC: Primary care physician; No PCP Per Patient

## 2016-01-27 ENCOUNTER — Encounter: Payer: Self-pay | Admitting: Emergency Medicine

## 2016-01-27 ENCOUNTER — Emergency Department
Admission: EM | Admit: 2016-01-27 | Discharge: 2016-01-27 | Disposition: A | Discharge status: Home / Self Care | Payer: BLUE CROSS/BLUE SHIELD | Attending: Student in an Organized Health Care Education/Training Program | Admitting: Student in an Organized Health Care Education/Training Program

## 2016-01-27 ENCOUNTER — Emergency Department: Payer: BLUE CROSS/BLUE SHIELD

## 2016-01-27 DIAGNOSIS — I10 Essential (primary) hypertension: Secondary | ICD-10-CM | POA: Diagnosis not present

## 2016-01-27 DIAGNOSIS — Z79899 Other long term (current) drug therapy: Secondary | ICD-10-CM | POA: Diagnosis not present

## 2016-01-27 DIAGNOSIS — F17200 Nicotine dependence, unspecified, uncomplicated: Secondary | ICD-10-CM | POA: Diagnosis not present

## 2016-01-27 DIAGNOSIS — R05 Cough: Secondary | ICD-10-CM | POA: Diagnosis present

## 2016-01-27 DIAGNOSIS — J40 Bronchitis, not specified as acute or chronic: Secondary | ICD-10-CM | POA: Insufficient documentation

## 2016-01-27 HISTORY — DX: Pneumonia, unspecified organism: J18.9

## 2016-01-27 LAB — CULTURE, BLOOD (ROUTINE X 2)
Culture: NO GROWTH
Culture: NO GROWTH

## 2016-01-27 LAB — BASIC METABOLIC PANEL
Anion gap: 6 (ref 5–15)
BUN: 9 mg/dL (ref 6–20)
CHLORIDE: 106 mmol/L (ref 101–111)
CO2: 28 mmol/L (ref 22–32)
Calcium: 8.7 mg/dL — ABNORMAL LOW (ref 8.9–10.3)
Creatinine, Ser: 0.85 mg/dL (ref 0.61–1.24)
GFR calc Af Amer: >60 mL/min (ref >60)
GFR calc non Af Amer: >60 mL/min (ref >60)
GLUCOSE: 106 mg/dL — AB (ref 65–99)
Potassium: 4.7 mmol/L (ref 3.5–5.1)
SODIUM: 140 mmol/L (ref 135–145)

## 2016-01-27 LAB — CBC
HEMATOCRIT: 41.5 % (ref 40.0–52.0)
HEMOGLOBIN: 14.3 g/dL (ref 13.0–18.0)
MCH: 31.6 pg (ref 26.0–34.0)
MCHC: 34.4 g/dL (ref 32.0–36.0)
MCV: 91.9 fL (ref 80.0–100.0)
Platelets: 199 10*3/uL (ref 150–440)
RBC: 4.52 MIL/uL (ref 4.40–5.90)
RDW: 13.6 % (ref 11.5–14.5)
WBC: 5.5 10*3/uL (ref 3.8–10.6)

## 2016-01-27 LAB — TROPONIN I: Troponin I: <0.03 ng/mL (ref <0.03)

## 2016-01-27 MED ORDER — LEVOFLOXACIN 500 MG PO TABS: 500.0000 mg | ORAL_TABLET | Freq: Every day | ORAL | 0 refills | Status: AC

## 2016-01-27 NOTE — ED Triage Notes (Signed)
Pt comes into the ED via POV c/o a need for levaquin refill for bronchitis and pneumonia.  Patient states he has completed the original antibiotic but was still short of breath and coughing yesterday.  Patient was admitted to the hospital for the pneumonia.  Patient appears in NAD at this time with even and unlabored respirations.

## 2016-01-27 NOTE — ED Provider Notes (Signed)
[facility] Emergency Department Provider Note    None    (approximate)  I have reviewed the triage vital signs and the nursing notes.   HISTORY  Chief Complaint Cough    HPI Bobby Williams is a 50 y.o. male who presents with concern regarding persistent cough and shortness of breath after recently being treated for bronchitis with Levaquin and albuterol. Patient states he went to work. He worked yesterday and walked over 17,000 steps according to his fit monitor. States that he feels that he overdid it. Denies any worsening fevers. No shortness of breath at this time. Denies any productive cough. Complete his last dose of antibiotics yesterday. Has still been using his breathing treatments with improvement.  No lower extremity swelling   Past Medical History:  Diagnosis Date  . Hypertension   . Pneumonia    Family History  Problem Relation Age of Onset  . Diabetes Neg Hx    History reviewed. No pertinent surgical history. Patient Active Problem List   Diagnosis Date Noted  . Alcohol withdrawal (HCC) 01/22/2016  . Rib fracture 01/22/2016  . CAP (community acquired pneumonia) 01/22/2016      Prior to Admission medications   Medication Sig Start Date End Date Taking? Authorizing Provider  albuterol (PROVENTIL HFA;VENTOLIN HFA) 108 (90 Base) MCG/ACT inhaler Inhale 1-2 puffs into the lungs every 4 (four) hours as needed for wheezing or shortness of breath. 01/23/16   Wyatt Haste, MD  amLODipine (NORVASC) 5 MG tablet Take 1 tablet (5 mg total) by mouth daily. 01/24/16   Wyatt Haste, MD  guaiFENesin (MUCINEX) 600 MG 12 hr tablet Take by mouth 2 (two) times daily.    Historical Provider, MD  levofloxacin (LEVAQUIN) 500 MG tablet Take 1 tablet (500 mg total) by mouth daily. 01/23/16 01/27/16  Wyatt Haste, MD    Allergies Patient has no known allergies.    Social History Social History  Substance Use Topics  . Smoking status: Never Smoker    . Smokeless tobacco: Current User  . Alcohol use 16.8 oz/week    28 Glasses of wine per week     Comment: 4 glasses per night    Review of Systems Patient denies headaches, rhinorrhea, blurry vision, numbness, shortness of breath, chest pain, edema, cough, abdominal pain, nausea, vomiting, diarrhea, dysuria, fevers, rashes or hallucinations unless otherwise stated above in HPI. ____________________________________________   PHYSICAL EXAM:  VITAL SIGNS: Vitals:   01/27/16 1236 01/27/16 1502  BP: (!) 143/96 (!) 142/94  Pulse: (!) 108 88  Resp: 20 20  Temp: 98.3 F (36.8 C)     Constitutional: Alert and oriented. Well appearing and in no acute distress. Eyes: Conjunctivae are normal. PERRL. EOMI. Head: Atraumatic. Nose: No congestion/rhinnorhea. Mouth/Throat: Mucous membranes are moist.  Oropharynx non-erythematous. Neck: No stridor. Painless ROM. No cervical spine tenderness to palpation Hematological/Lymphatic/Immunilogical: No cervical lymphadenopathy. Cardiovascular: Normal rate, regular rhythm. Grossly normal heart sounds.  Good peripheral circulation. Respiratory: Normal respiratory effort.  No retractions. Lungs CTAB. Gastrointestinal: Soft and nontender. No distention. No abdominal bruits. No CVA tenderness. Musculoskeletal: No lower extremity tenderness nor edema.  No joint effusions. Neurologic:  Normal speech and language. No gross focal neurologic deficits are appreciated. No gait instability. Skin:  Skin is warm, dry and intact. No rash noted. Psychiatric: Mood and affect are normal. Speech and behavior are normal.  ____________________________________________   LABS (all labs ordered are listed, but only abnormal results are displayed)  Results for  orders placed or performed during the hospital encounter of 01/27/16 (from the past 24 hour(s))  CBC     Status: None   Collection Time: 01/27/16 12:40 PM  Result Value Ref Range   WBC 5.5 3.8 - 10.6 K/uL   RBC  4.52 4.40 - 5.90 MIL/uL   Hemoglobin 14.3 13.0 - 18.0 g/dL   HCT 40.941.5 81.140.0 - 91.452.0 %   MCV 91.9 80.0 - 100.0 fL   MCH 31.6 26.0 - 34.0 pg   MCHC 34.4 32.0 - 36.0 g/dL   RDW 78.213.6 95.611.5 - 21.314.5 %   Platelets 199 150 - 440 K/uL  Troponin I     Status: None   Collection Time: 01/27/16 12:40 PM  Result Value Ref Range   Troponin I <0.03 <0.03 ng/mL  Basic metabolic panel     Status: Abnormal   Collection Time: 01/27/16 12:40 PM  Result Value Ref Range   Sodium 140 135 - 145 mmol/L   Potassium 4.7 3.5 - 5.1 mmol/L   Chloride 106 101 - 111 mmol/L   CO2 28 22 - 32 mmol/L   Glucose, Bld 106 (H) 65 - 99 mg/dL   BUN 9 6 - 20 mg/dL   Creatinine, Ser 0.860.85 0.61 - 1.24 mg/dL   Calcium 8.7 (L) 8.9 - 10.3 mg/dL   GFR calc non Af Amer >60 >60 mL/min   GFR calc Af Amer >60 >60 mL/min   Anion gap 6 5 - 15   ____________________________________________  EKG My review and personal interpretation at Time: 12:44   Indication: cough  Rate: 110  Rhythm: sinus Axis: normal Other:  Non specific changes, No STEMI ____________________________________________  RADIOLOGY  I personally reviewed all radiographic images ordered to evaluate for the above acute complaints and reviewed radiology reports and findings.  These findings were personally discussed with the patient.  Please see medical record for radiology report. ____________________________________________   PROCEDURES  Procedure(s) performed:  Procedures    Critical Care performed: no ____________________________________________   INITIAL IMPRESSION / ASSESSMENT AND PLAN / ED COURSE  Pertinent labs & imaging results that were available during my care of the patient were reviewed by me and considered in my medical decision making (see chart for details).  DDX: Asthma, copd, CHF, pna, ptx, malignancy, Pe, anemia   Ria BushMark C Iglesia is a 50 y.o. who presents to the ED with persistent cough status post recent treatment and admission for  bronchitis. Patient arrives afebrile hemodynamic stable. No acute hypoxia. Chest x-ray shows no evidence of consolidation or heart failure. He has no other extremity swelling. Denies any chest pain or pleuritic discomfort. Do not feel this clinically consistent with pulmonary embolism. Based on his presentation will extend antibiotics for another 3 days to complete treatment as well as encouraged patient to continue using albuterol treatments. Do not feel he requires admission to hospital at this time. Patient has follow-up with primary care physician.  Patient was able to tolerate PO and was able to ambulate with a steady gait.  Have discussed with the patient and available family all diagnostics and treatments performed thus far and all questions were answered to the best of my ability. The patient demonstrates understanding and agreement with plan.   Clinical Course      ____________________________________________   FINAL CLINICAL IMPRESSION(S) / ED DIAGNOSES  Final diagnoses:  Bronchitis      NEW MEDICATIONS STARTED DURING THIS VISIT:  New Prescriptions   No medications on file  Note:  This document was prepared using Dragon voice recognition software and may include unintentional dictation errors.    Willy EddyPatrick Sadiq Mccauley, MD 01/27/16 [phone]

## 2018-10-05 ENCOUNTER — Encounter: Payer: Self-pay | Admitting: Neurology

## 2018-10-14 ENCOUNTER — Encounter: Payer: Self-pay | Admitting: Neurology

## 2018-10-19 ENCOUNTER — Ambulatory Visit (INDEPENDENT_AMBULATORY_CARE_PROVIDER_SITE_OTHER): Payer: Managed Care, Other (non HMO) | Admitting: Neurology

## 2018-10-19 ENCOUNTER — Other Ambulatory Visit: Payer: Self-pay | Admitting: *Deleted

## 2018-10-19 ENCOUNTER — Other Ambulatory Visit: Payer: Self-pay

## 2018-10-19 ENCOUNTER — Encounter: Payer: Self-pay | Admitting: Neurology

## 2018-10-19 VITALS — Ht 76.0 in | Wt 260.0 lb

## 2018-10-19 DIAGNOSIS — R2990 Unspecified symptoms and signs involving the nervous system: Secondary | ICD-10-CM

## 2018-10-19 NOTE — Progress Notes (Signed)
Virtual Visit via Video Note The purpose of this virtual visit is to provide medical care while limiting exposure to the novel coronavirus.    Consent was obtained for video visit:  Yes.   Answered questions that patient had about telehealth interaction:  Yes.   I discussed the limitations, risks, security and privacy concerns of performing an evaluation and management service by telemedicine. I also discussed with the patient that there may be a patient responsible charge related to this service. The patient expressed understanding and agreed to proceed.  Pt location: Home Physician Location: office Name of referring provider:  Tyler Pita, MD I connected with Bobby Williams at patients initiation/request on 10/19/2018 at  1:00 PM EDT by video enabled telemedicine application and verified that I am speaking with the correct person using two identifiers. Pt MRN:  [id number] Pt DOB:  07-16-1965 Video Participants:  Bobby Williams   History of Present Illness:  This is a 53 year old right-handed man with a history of diet-controlled hypertension, anxiety, presenting for evaluation of possible seizures. He started having recurrent stereotyped symptoms around 5 years ago where he would start having a sensation of deja vu, followed by fear/anxiety, olfactory hallucinations of a musty smell, then sweating and gagging a couple of times. The episodes last 15 seconds, he has a handful in one year. Sometimes they occur in clusters where he had 3 in one day, one while laying in bed after waking up, then as he was getting ready for work, and another later that day. Then he would go months without any episodes. The last episode was around May 2020. He does not identify any clear triggers, but does recall a birthday weekend he had an episode and had more wine than usual. Sleep is generally okay. He denies any associated confusion with these episodes, he feels he has to hold on to something. No associated  headache, focal numbness/tingling/weakness. He lives with his wife and 61 year old, and has not been told of any staring/unresponsive episodes. He denies any gaps in time. 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 He filled the car and got back to her, he did not feel confused but realized that the event was unusual where he was not with it to remember the code. This was not preceded by any of the typical stereotyped symptoms, no headache, focal symptoms. He denies any headaches, dizziness, diplopia, dysarthria/dysphagia, neck/back pain, bowel/bladder dysfunction, myoclonic jerks. He had a normal birth and early development.  There is no history of febrile convulsions, CNS infections such as meningitis/encephalitis, significant traumatic brain injury, neurosurgical procedures, or family history of seizures.   PAST MEDICAL HISTORY: Past Medical History:  Diagnosis Date   Anxiety    Hypertension    Pneumonia     PAST SURGICAL HISTORY: History reviewed. No pertinent surgical history.  MEDICATIONS: Current Outpatient Medications on File Prior to Visit  Medication Sig Dispense Refill   aspirin EC 81 MG tablet Take 81 mg by mouth daily.     No current facility-administered medications on file prior to visit.     ALLERGIES: No Known Allergies  FAMILY HISTORY: Family History  Problem Relation Age of Onset   Diabetes Neg Hx       Current Outpatient Medications on File Prior  to Visit  Medication Sig Dispense Refill   aspirin EC 81 MG tablet Take 81 mg by mouth daily.     No current facility-administered medications on file prior to visit.      Observations/Objective:   Vitals:   10/14/18 0949  Weight: 260 lb (117.9 kg)  Height: 6\' 4"  (1.93 m)   GEN:  The patient appears stated age and is in NAD.  Neurological examination:  Patient is awake, alert, oriented x 3. No aphasia or dysarthria. Intact fluency and comprehension. Remote and recent memory intact. Able to name and repeat. Cranial nerves: Extraocular movements intact with no nystagmus. No facial asymmetry. Motor: moves all extremities symmetrically, at least anti-gravity x 4. No incoordination on finger to nose testing. Gait: narrow-based and steady, able to tandem walk adequately.   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 He denies any loss of consciousness/awareness. He had an unusual episode 3 weeks ago where he could not recall a code, but did not have the stereotyped sensation. We discussed doing a seizure workup with MRI brain with and without contrast and a 1-hour EEG. If EEG is normal, a 24-hour EEG will be done to further evaluate symptoms. [place] driving laws were discussed with the patient, and he knows to stop driving after an episode of loss of consciousness/awareness, until 6 months event-free. Follow-up after tests, he knows to call for any changes.    Follow Up Instructions:   -I discussed the assessment and treatment plan with the patient. The patient was provided an opportunity to ask questions and all were answered. The patient agreed with the plan and demonstrated an understanding of the instructions.   The patient was advised to call back or seek an in-person evaluation if the symptoms worsen or if the condition fails to improve as anticipated.    44, MD

## 2018-11-23 ENCOUNTER — Other Ambulatory Visit: Payer: Self-pay

## 2018-11-23 ENCOUNTER — Inpatient Hospital Stay
Admission: EM | Admit: 2018-11-23 | Discharge: 2018-11-26 | DRG: 871 | Disposition: A | Discharge status: Home / Self Care | Payer: No Typology Code available for payment source | Attending: Internal Medicine | Admitting: Internal Medicine

## 2018-11-23 ENCOUNTER — Encounter: Payer: Self-pay | Admitting: Medical Oncology

## 2018-11-23 ENCOUNTER — Emergency Department: Payer: No Typology Code available for payment source

## 2018-11-23 DIAGNOSIS — I10 Essential (primary) hypertension: Secondary | ICD-10-CM | POA: Diagnosis present

## 2018-11-23 DIAGNOSIS — Z23 Encounter for immunization: Secondary | ICD-10-CM

## 2018-11-23 DIAGNOSIS — J189 Pneumonia, unspecified organism: Secondary | ICD-10-CM | POA: Diagnosis present

## 2018-11-23 DIAGNOSIS — Z7982 Long term (current) use of aspirin: Secondary | ICD-10-CM

## 2018-11-23 DIAGNOSIS — Z20828 Contact with and (suspected) exposure to other viral communicable diseases: Secondary | ICD-10-CM | POA: Diagnosis present

## 2018-11-23 DIAGNOSIS — F10239 Alcohol dependence with withdrawal, unspecified: Secondary | ICD-10-CM | POA: Diagnosis present

## 2018-11-23 DIAGNOSIS — F10930 Alcohol use, unspecified with withdrawal, uncomplicated: Secondary | ICD-10-CM | POA: Diagnosis present

## 2018-11-23 DIAGNOSIS — A419 Sepsis, unspecified organism: Secondary | ICD-10-CM | POA: Diagnosis present

## 2018-11-23 DIAGNOSIS — F10939 Alcohol use, unspecified with withdrawal, unspecified: Secondary | ICD-10-CM | POA: Diagnosis present

## 2018-11-23 DIAGNOSIS — R0602 Shortness of breath: Secondary | ICD-10-CM

## 2018-11-23 LAB — URINE DRUG SCREEN, QUALITATIVE (ARMC ONLY)
Amphetamines, Ur Screen: NOT DETECTED
Barbiturates, Ur Screen: NOT DETECTED
Benzodiazepine, Ur Scrn: NOT DETECTED
Cannabinoid 50 Ng, Ur ~~LOC~~: NOT DETECTED
Cocaine Metabolite,Ur ~~LOC~~: NOT DETECTED
MDMA (Ecstasy)Ur Screen: NOT DETECTED
Methadone Scn, Ur: NOT DETECTED
Opiate, Ur Screen: NOT DETECTED
Phencyclidine (PCP) Ur S: NOT DETECTED
Tricyclic, Ur Screen: NOT DETECTED

## 2018-11-23 LAB — DIFFERENTIAL
Abs Immature Granulocytes: 0.01 10*3/uL (ref 0.00–0.07)
Basophils Absolute: 0.1 10*3/uL (ref 0.0–0.1)
Basophils Relative: 1 %
Eosinophils Absolute: 0 10*3/uL (ref 0.0–0.5)
Eosinophils Relative: 0 %
Immature Granulocytes: 0 %
Lymphocytes Relative: 16 %
Lymphs Abs: 0.9 10*3/uL (ref 0.7–4.0)
Monocytes Absolute: 0.6 10*3/uL (ref 0.1–1.0)
Monocytes Relative: 12 %
Neutro Abs: 3.7 10*3/uL (ref 1.7–7.7)
Neutrophils Relative %: 71 %

## 2018-11-23 LAB — COMPREHENSIVE METABOLIC PANEL
ALT: 44 U/L (ref 0–44)
AST: 43 U/L — ABNORMAL HIGH (ref 15–41)
Albumin: 5.1 g/dL — ABNORMAL HIGH (ref 3.5–5.0)
Alkaline Phosphatase: 57 U/L (ref 38–126)
Anion gap: 13 (ref 5–15)
BUN: 10 mg/dL (ref 6–20)
CO2: 26 mmol/L (ref 22–32)
Calcium: 9.6 mg/dL (ref 8.9–10.3)
Chloride: 96 mmol/L — ABNORMAL LOW (ref 98–111)
Creatinine, Ser: 0.73 mg/dL (ref 0.61–1.24)
GFR calc Af Amer: >60 mL/min (ref >60)
GFR calc non Af Amer: >60 mL/min (ref >60)
Glucose, Bld: 112 mg/dL — ABNORMAL HIGH (ref 70–99)
Potassium: 3.9 mmol/L (ref 3.5–5.1)
Sodium: 135 mmol/L (ref 135–145)
Total Bilirubin: 0.8 mg/dL (ref 0.3–1.2)
Total Protein: 8.3 g/dL — ABNORMAL HIGH (ref 6.5–8.1)

## 2018-11-23 LAB — PROTIME-INR
INR: 0.9 (ref 0.8–1.2)
Prothrombin Time: 12.5 seconds (ref 11.4–15.2)

## 2018-11-23 LAB — CBC
HCT: 43.7 % (ref 39.0–52.0)
Hemoglobin: 14.7 g/dL (ref 13.0–17.0)
MCH: 30.6 pg (ref 26.0–34.0)
MCHC: 33.6 g/dL (ref 30.0–36.0)
MCV: 90.9 fL (ref 80.0–100.0)
Platelets: 153 10*3/uL (ref 150–400)
RBC: 4.81 MIL/uL (ref 4.22–5.81)
RDW: 12.1 % (ref 11.5–15.5)
WBC: 5.3 10*3/uL (ref 4.0–10.5)
nRBC: 0 % (ref 0.0–0.2)

## 2018-11-23 LAB — APTT: aPTT: 27 seconds (ref 24–36)

## 2018-11-23 LAB — ETHANOL: Alcohol, Ethyl (B): <10 mg/dL (ref <10)

## 2018-11-23 LAB — GLUCOSE, CAPILLARY: Glucose-Capillary: 101 mg/dL — ABNORMAL HIGH (ref 70–99)

## 2018-11-23 MED ORDER — LORAZEPAM 2 MG/ML IJ SOLN: 0.0000 mg | Freq: Two times a day (BID) | INTRAMUSCULAR | Status: DC

## 2018-11-23 MED ORDER — ADULT MULTIVITAMIN W/MINERALS CH
1.0000 | ORAL_TABLET | Freq: Every day | ORAL | Status: DC
  Administered 2018-11-24 – 2018-11-25 (×2): 1 via ORAL
  Filled 2018-11-23 (×3): qty 1

## 2018-11-23 MED ORDER — FOLIC ACID 1 MG PO TABS
1.0000 mg | ORAL_TABLET | Freq: Every day | ORAL | Status: DC
  Administered 2018-11-24 – 2018-11-26 (×3): 1 mg via ORAL
  Filled 2018-11-23 (×4): qty 1

## 2018-11-23 MED ORDER — LORAZEPAM 2 MG/ML IJ SOLN: 1.0000 mg | INTRAMUSCULAR | Status: DC | PRN

## 2018-11-23 MED ORDER — LORAZEPAM 2 MG/ML IJ SOLN
1.0000 mg | Freq: Once | INTRAMUSCULAR | Status: AC
  Administered 2018-11-23: 1 mg via INTRAVENOUS
  Filled 2018-11-23: qty 1

## 2018-11-23 MED ORDER — LORAZEPAM 2 MG/ML IJ SOLN
2.0000 mg | Freq: Once | INTRAMUSCULAR | Status: AC
  Administered 2018-11-23: 2 mg via INTRAVENOUS
  Filled 2018-11-23: qty 1

## 2018-11-23 MED ORDER — ACETAMINOPHEN 500 MG PO TABS
1000.0000 mg | ORAL_TABLET | Freq: Once | ORAL | Status: AC
  Administered 2018-11-23: 1000 mg via ORAL
  Filled 2018-11-23: qty 2

## 2018-11-23 MED ORDER — LABETALOL HCL 5 MG/ML IV SOLN
10.0000 mg | INTRAVENOUS | Status: DC | PRN
  Administered 2018-11-24: 10 mg via INTRAVENOUS
  Filled 2018-11-23 (×2): qty 4

## 2018-11-23 MED ORDER — SODIUM CHLORIDE 0.9 % IV BOLUS
1000.0000 mL | Freq: Once | INTRAVENOUS | Status: AC
  Administered 2018-11-23: 1000 mL via INTRAVENOUS

## 2018-11-23 MED ORDER — DOXYCYCLINE HYCLATE 100 MG PO TABS
100.0000 mg | ORAL_TABLET | Freq: Once | ORAL | Status: AC
  Administered 2018-11-23: 100 mg via ORAL
  Filled 2018-11-23: qty 1

## 2018-11-23 MED ORDER — VITAMIN B-1 100 MG PO TABS
100.0000 mg | ORAL_TABLET | Freq: Every day | ORAL | Status: DC
  Administered 2018-11-24 – 2018-11-26 (×3): 100 mg via ORAL
  Filled 2018-11-23 (×4): qty 1

## 2018-11-23 MED ORDER — LORAZEPAM 2 MG/ML IJ SOLN
0.0000 mg | Freq: Four times a day (QID) | INTRAMUSCULAR | Status: AC
  Administered 2018-11-24: 2 mg via INTRAVENOUS
  Filled 2018-11-23 (×2): qty 1

## 2018-11-23 MED ORDER — SODIUM CHLORIDE 0.9% FLUSH: 3.0000 mL | Freq: Once | INTRAVENOUS | Status: DC

## 2018-11-23 MED ORDER — THIAMINE HCL 100 MG/ML IJ SOLN
100.0000 mg | Freq: Every day | INTRAMUSCULAR | Status: DC
  Filled 2018-11-23: qty 1

## 2018-11-23 MED ORDER — SODIUM CHLORIDE 0.9 % IV SOLN
1.0000 g | Freq: Once | INTRAVENOUS | Status: AC
  Administered 2018-11-24: 1 g via INTRAVENOUS
  Filled 2018-11-23: qty 10

## 2018-11-23 MED ORDER — LORAZEPAM 1 MG PO TABS
1.0000 mg | ORAL_TABLET | ORAL | Status: DC | PRN
  Administered 2018-11-24: 2 mg via ORAL
  Filled 2018-11-23: qty 2

## 2018-11-23 NOTE — ED Notes (Signed)
When asked how pt is feeling. Pt states he is fine just tired. When asked if he feels better, pt is unable to give a clear answer. Pt is unable to articulate exact feelings. Pt is still very red in the face and torso. Fluids are complete. BP is still elevated. MD aware. No new orders received.

## 2018-11-23 NOTE — H&P (Signed)
[facility] - [place] at [facility]   PATIENT NAME: Bobby Williams    MR#:  [id number]  DATE OF BIRTH:  1965-07-12  DATE OF ADMISSION:  11/23/2018  PRIMARY CARE PHYSICIAN: Patient, No Pcp Per   REQUESTING/REFERRING PHYSICIAN: Fuller PlanFunke, MD  CHIEF COMPLAINT:   Chief Complaint  Patient presents with  . Altered Mental Status    HISTORY OF PRESENT ILLNESS:  Bobby PhiMark Williams  is a 53 y.o. male who presents with chief complaint as above.  Patient presents to the ED with a complaint of general malaise feeling for the past couple of days.  He does state that he has had a little bit of a cough.  However, he had an acute episode of some confusion today.  His family brought him in for evaluation.  Initially there was concern that he might have a stroke, however his symptoms were not consistent with that on evaluation in the ED.  He then spiked a fever here, and work-up showed patchy likely multifocal pneumonia.  Covid test is still pending.  Hospitalist were called for admission  PAST MEDICAL HISTORY:   Past Medical History:  Diagnosis Date  . Anxiety   . Hypertension   . Pneumonia      PAST SURGICAL HISTORY:   Past Surgical History:  Procedure Laterality Date  . NO PAST SURGERIES       SOCIAL HISTORY:   Social History   Tobacco Use  . Smoking status: Never Smoker  . Smokeless tobacco: Current User  Substance Use Topics  . Alcohol use: Yes    Alcohol/week: 28.0 standard drinks    Types: 28 Glasses of wine per week    Comment: 4 glasses per night     FAMILY HISTORY:   Family History  Problem Relation Age of Onset  . Diabetes Neg Hx      DRUG ALLERGIES:  No Known Allergies  MEDICATIONS AT HOME:   Prior to Admission medications   Medication Sig Start Date End Date Taking? Authorizing Provider  aspirin EC 81 MG tablet Take 81 mg by mouth daily.    [provider]    REVIEW OF SYSTEMS:  Review of Systems  Constitutional: Positive for  fever and malaise/fatigue. Negative for chills and weight loss.  HENT: Negative for ear pain, hearing loss and tinnitus.   Eyes: Negative for blurred vision, double vision, pain and redness.  Respiratory: Positive for cough. Negative for hemoptysis and shortness of breath.   Cardiovascular: Negative for chest pain, palpitations, orthopnea and leg swelling.  Gastrointestinal: Negative for abdominal pain, constipation, diarrhea, nausea and vomiting.  Genitourinary: Negative for dysuria, frequency and hematuria.  Musculoskeletal: Negative for back pain, joint pain and neck pain.  Skin:       No acne, rash, or lesions  Neurological: Negative for dizziness, tremors, focal weakness and weakness.       Confusion  Endo/Heme/Allergies: Negative for polydipsia. Does not bruise/bleed easily.  Psychiatric/Behavioral: Negative for depression. The patient is not nervous/anxious and does not have insomnia.      VITAL SIGNS:   Vitals:   11/23/18 2100 11/23/18 2140 11/23/18 2156 11/23/18 2200  BP: (!) 183/115 (!) 190/127 (!) 175/111 (!) 184/108  Pulse: (!) 102 99 94 93  Resp: 15 (!) 24 18 (!) 23  Temp:   100.1 F (37.8 C)   TempSrc:   Oral   SpO2: 97% 95% (S) 96% 97%  Weight:      Height:  Wt Readings from Last 3 Encounters:  11/23/18 117.9 kg  10/14/18 117.9 kg  01/27/16 99.8 kg    PHYSICAL EXAMINATION:  Physical Exam  Vitals reviewed. Constitutional: He is oriented to person, place, and time. He appears well-developed and well-nourished. No distress.  HENT:  Head: Normocephalic and atraumatic.  Mouth/Throat: Oropharynx is clear and moist.  Eyes: Pupils are equal, round, and reactive to light. Conjunctivae and EOM are normal. No scleral icterus.  Neck: Normal range of motion. Neck supple. No JVD present. No thyromegaly present.  Cardiovascular: Normal rate, regular rhythm and intact distal pulses. Exam reveals no gallop and no friction rub.  No murmur heard. Respiratory: Effort  normal. No respiratory distress. He has no wheezes. He has no rales.  Mild soft coarse breath sounds  GI: Soft. Bowel sounds are normal. He exhibits no distension. There is no abdominal tenderness.  Musculoskeletal: Normal range of motion.        General: No edema.     Comments: No arthritis, no gout  Lymphadenopathy:    He has no cervical adenopathy.  Neurological: He is alert and oriented to person, place, and time. No cranial nerve deficit.  No dysarthria, no aphasia  Skin: Skin is warm and dry. No rash noted. No erythema.  Psychiatric: He has a normal mood and affect. His behavior is normal. Judgment and thought content normal.    LABORATORY PANEL:   CBC Recent Labs  Lab 11/23/18 1838  WBC 5.3  HGB 14.7  HCT 43.7  PLT 153   ------------------------------------------------------------------------------------------------------------------  Chemistries  Recent Labs  Lab 11/23/18 1838  NA 135  K 3.9  CL 96*  CO2 26  GLUCOSE 112*  BUN 10  CREATININE 0.73  CALCIUM 9.6  AST 43*  ALT 44  ALKPHOS 57  BILITOT 0.8   ------------------------------------------------------------------------------------------------------------------  Cardiac Enzymes No results for input(s): TROPONINI in the last 168 hours. ------------------------------------------------------------------------------------------------------------------  RADIOLOGY:  Dg Chest 1 View  Result Date: 11/23/2018 CLINICAL DATA:  Shortness of breath EXAM: CHEST  1 VIEW COMPARISON:  Radiograph 01/27/2016 FINDINGS: Low lung volumes. Basilar areas of atelectasis. More patchy airspace opacity in the right lung and periphery of the left lung base. No pneumothorax or effusion. Cardiomediastinal contours are unremarkable for technique. No acute osseous or soft tissue abnormality. IMPRESSION: Low lung volumes with bibasilar atelectasis. More patchy airspace opacity in the right lung and periphery of the left lung base,  suspicious for multifocal pneumonia in the proper clinical setting. Electronically Signed   By: Lovena Le M.D.   On: 11/23/2018 22:34   Ct Head Wo Contrast  Result Date: 11/23/2018 CLINICAL DATA:  Acute onset of confusion today. Disorientation. EXAM: CT HEAD WITHOUT CONTRAST TECHNIQUE: Contiguous axial images were obtained from the base of the skull through the vertex without intravenous contrast. COMPARISON:  None. FINDINGS: Brain: The brain shows a normal appearance without evidence of malformation, atrophy, old or acute small or large vessel infarction, mass lesion, hemorrhage, hydrocephalus or extra-axial collection. Vascular: No hyperdense vessel. No evidence of atherosclerotic calcification. Skull: Normal. No traumatic finding. No focal bone lesion. Sinuses/Orbits: Mild mucosal thickening of the paranasal sinuses. No advanced inflammatory change. Orbits negative. Other: None significant IMPRESSION: Normal head CT except for mild mucosal thickening of the paranasal sinuses. Electronically Signed   By: Nelson Chimes M.D.   On: 11/23/2018 19:18    EKG:   Orders placed or performed during the hospital encounter of 11/23/18  . ED EKG  . ED EKG  IMPRESSION AND PLAN:  Principal Problem:   Sepsis (HCC) -IV antibiotics given, sources pneumonia, however we will check a procalcitonin as well as his white blood cell count was not elevated.  Coronavirus test is still pending.  Lactic acid ordered, blood cultures sent.  Blood pressure has been stable. Active Problems:   Alcohol withdrawal (HCC) -CIWA protocol   CAP (community acquired pneumonia) -IV antibiotics as above, supportive treatment as needed.  Follow-up on procalcitonin and Covid testing   Uncontrolled hypertension -patient has not taken any antihypertensive medications at home for the last little while.  As needed antihypertensives with blood pressure goal less than 160/100  Chart review performed and case discussed with ED  provider. Labs, imaging and/or ECG reviewed by provider and discussed with patient/family. Management plans discussed with the patient and/or family.  COVID-19 status: Pending  DVT PROPHYLAXIS: SubQ lovenox   GI PROPHYLAXIS:  None  ADMISSION STATUS: Inpatient     CODE STATUS: Full Code Status History    Date Active Date Inactive Code Status Order ID Comments User Context   01/22/2016 1355 01/23/2016 1648 Full Code [id number]  Hower, Cletis [place], MD ED   Advance Care Planning Activity      TOTAL TIME TAKING CARE OF THIS PATIENT: 45 minutes.   This patient was evaluated in the context of the global COVID-19 pandemic, which necessitated consideration that the patient might be at risk for infection with the SARS-CoV-2 virus that causes COVID-19. Institutional protocols and algorithms that pertain to the evaluation of patients at risk for COVID-19 are in a state of rapid change based on information released by regulatory bodies including the CDC and federal and state organizations. These policies and algorithms were followed to the best of this provider's knowledge to date during the patient's care at this facility.  Barney Drain 11/23/2018, 11:39 PM  [facility]  Office  [phone]  CC: Primary care physician; Patient, No Pcp Per  Note:  This document was prepared using Dragon voice recognition software and may include unintentional dictation errors.

## 2018-11-23 NOTE — ED Notes (Signed)
Pt answering all orientation questions at this time correctly. No arm drift, no leg or facial drift. Pt states voice is normal voice. Denies blurred vision. Denies numbness, tingling, weakness. Only c/o is feeling tired. Pt is shaky, states normally has some shakiness but it's a little worse right now. States he usually drinks some wine and hasn't had any. States some anxiety.

## 2018-11-23 NOTE — ED Triage Notes (Signed)
Pt from home via ems- per wife pt went to work this am as normal, got a call from a co worker at 1330 today stating that patient was "not acting right". Co worker stated that pt was wondering around and talking out of his head. Pt states that he has been feeling tired today but other wise normal. Pt Alert but disoriented to time. Pt thinks that it is 2016. Pt has been off of his BP meds for about 2 months. CBG 101 with this RN.

## 2018-11-23 NOTE — ED Provider Notes (Signed)
[facility] Emergency Department Provider Note  ____________________________________________   First MD Initiated Contact with Patient 11/23/18 1819     (approximate)  I have reviewed the triage vital signs and the nursing notes.   HISTORY  Chief Complaint Altered Mental Status    HPI Bobby Williams is a 53 y.o. male with hypertension, anxiety who presents with altered mental status.  At 1 point there was concern the patient was having epileptic seizures and was told to get a EEG and MRI.  However does not appear that these were done.  Patient said that he was walking around at work and he was more confused.  He feels like this started this morning, intermittent, nothing made it better, nothing made it worse.  Patient's wife picked him up from work around 130 and she noted that he was not acting right.  He says that he drinks 2 bottles of wine daily.  Denies a history withdrawal seizures but does feel shaky right now like he is withdrawing.           Past Medical History:  Diagnosis Date   Anxiety    Hypertension    Pneumonia     Patient Active Problem List   Diagnosis Date Noted   Alcohol withdrawal (HCC) 01/22/2016   Rib fracture 01/22/2016   CAP (community acquired pneumonia) 01/22/2016    History reviewed. No pertinent surgical history.  Prior to Admission medications   Medication Sig Start Date End Date Taking? Authorizing Provider  aspirin EC 81 MG tablet Take 81 mg by mouth daily.    [provider]    Allergies Patient has no known allergies.  Family History  Problem Relation Age of Onset   Diabetes Neg Hx     Social History Social History   Tobacco Use   Smoking status: Never Smoker   Smokeless tobacco: Current User  Substance Use Topics   Alcohol use: Yes    Alcohol/week: 28.0 standard drinks    Types: 28 Glasses of wine per week    Comment: 4 glasses per night   Drug use: Never      Review  of Systems Constitutional: No fever/chills Eyes: No visual changes. ENT: No sore throat. Cardiovascular: Denies chest pain. Respiratory: Denies shortness of breath. Gastrointestinal: No abdominal pain.  No nausea, no vomiting.  No diarrhea.  No constipation. Genitourinary: Negative for dysuria. Musculoskeletal: Negative for back pain. Skin: Negative for rash. Neurological: Changes in mental status All other ROS negative ____________________________________________   PHYSICAL EXAM:  VITAL SIGNS: ED Triage Vitals [11/23/18 1832]  Enc Vitals Group     BP (!) 202/123     Pulse Rate (!) 103     Resp 18     Temp 99.2 F (37.3 C)     Temp Source Oral     SpO2 100 %     Weight 260 lb (117.9 kg)     Height 6\' 4"  (1.93 m)     Head Circumference      Peak Flow      Pain Score 0     Pain Loc      Pain Edu?      Excl. in GC?     Constitutional: Alert and oriented. Well appearing and in no acute distress. Eyes: Conjunctivae are normal. EOMI. Head: Atraumatic.  Face is red. Nose: No congestion/rhinnorhea. Mouth/Throat: Mucous membranes are moist.   Neck: No stridor. Trachea Midline. FROM Cardiovascular: Tachycardic, regular rhythm. Grossly normal heart  sounds.  Good peripheral circulation. Respiratory: Normal respiratory effort.  No retractions. Lungs CTAB. Gastrointestinal: Soft and nontender. No distention. No abdominal bruits.  Musculoskeletal: No lower extremity tenderness nor edema.  No joint effusions. Neurologic:  Normal speech and language. No gross focal neurologic deficits are appreciated.  Skin:  Skin is warm, dry and intact. No rash noted. Psychiatric: Mood and affect are normal. Speech and behavior are normal. GU: Deferred   ____________________________________________   LABS (all labs ordered are listed, but only abnormal results are displayed)  Labs Reviewed  GLUCOSE, CAPILLARY - Abnormal; Notable for the following components:      Result Value    Glucose-Capillary 101 (*)    All other components within normal limits  COMPREHENSIVE METABOLIC PANEL - Abnormal; Notable for the following components:   Chloride 96 (*)    Glucose, Bld 112 (*)    Total Protein 8.3 (*)    Albumin 5.1 (*)    AST 43 (*)    All other components within normal limits  SARS CORONAVIRUS 2 (TAT 6-24 HRS)  CULTURE, BLOOD (ROUTINE X 2)  CULTURE, BLOOD (ROUTINE X 2)  PROTIME-INR  APTT  CBC  DIFFERENTIAL  URINE DRUG SCREEN, QUALITATIVE ([facility] ONLY)  ETHANOL  LACTIC ACID, PLASMA  LACTIC ACID, PLASMA  CBG MONITORING, ED   ____________________________________________   ED ECG REPORT I, Vanessa [place], the attending physician, personally viewed and interpreted this ECG.  EKG sinus tachy no ST elevation, no T wave inversion, normal intervals ____________________________________________  RADIOLOGY Robert Bellow, personally viewed and evaluated these images (plain radiographs) as part of my medical decision making, as well as reviewing the written report by the radiologist.  ED MD interpretation:    Official radiology report(s): Ct Head Wo Contrast  Result Date: 11/23/2018 CLINICAL DATA:  Acute onset of confusion today. Disorientation. EXAM: CT HEAD WITHOUT CONTRAST TECHNIQUE: Contiguous axial images were obtained from the base of the skull through the vertex without intravenous contrast. COMPARISON:  None. FINDINGS: Brain: The brain shows a normal appearance without evidence of malformation, atrophy, old or acute small or large vessel infarction, mass lesion, hemorrhage, hydrocephalus or extra-axial collection. Vascular: No hyperdense vessel. No evidence of atherosclerotic calcification. Skull: Normal. No traumatic finding. No focal bone lesion. Sinuses/Orbits: Mild mucosal thickening of the paranasal sinuses. No advanced inflammatory change. Orbits negative. Other: None significant IMPRESSION: Normal head CT except for mild mucosal thickening of the paranasal  sinuses. Electronically Signed   By: Nelson Chimes M.D.   On: 11/23/2018 19:18    ____________________________________________   PROCEDURES  Procedure(s) performed (including Critical Care):  .Critical Care Performed by: Vanessa [place], MD Authorized by: Vanessa Pendleton, MD   Critical care provider statement:    Critical care time (minutes):  30   Critical care was necessary to treat or prevent imminent or life-threatening deterioration of the following conditions:  Sepsis   Critical care was time spent personally by me on the following activities:  Discussions with consultants, evaluation of patient's response to treatment, examination of patient, ordering and performing treatments and interventions, ordering and review of laboratory studies, ordering and review of radiographic studies, pulse oximetry, re-evaluation of patient's condition, obtaining history from patient or surrogate and review of old charts     ____________________________________________   INITIAL IMPRESSION / [place] / ED COURSE  DADRIAN BALLANTINE was evaluated in Emergency Department on 11/23/2018 for the symptoms described in the history of present illness. He was evaluated in  the context of the global COVID-19 pandemic, which necessitated consideration that the patient might be at risk for infection with the SARS-CoV-2 virus that causes COVID-19. Institutional protocols and algorithms that pertain to the evaluation of patients at risk for COVID-19 are in a state of rapid change based on information released by regulatory bodies including the CDC and federal and state organizations. These policies and algorithms were followed during the patient's care in the ED.    Pt comes in with altered mental status with some low-grade temps with EMS.  We will continue to monitor temperatures.  This is most concerning for infection such as UTI, pneumonia.  No abdominal tenderness to suggest infection.  Will get CT head  given the confusion.  Patient now is alert and oriented x4 which is reassuring.   CT scan is negative.  Labs reviewed and unremarkable.  Patient was satting 92 on room air.  Patient was placed on 2 L nasal cannula.  Patient's temperature is also uptrending.  Will get chest x-ray.  Chest x-ray is concerning for multifocal pneumonia.  Will start on ceftriaxone and doxy.  Will get Covid swab.  Will discuss possible team for admission.  Patient is meeting SIRS criteria so will get blood cultures and lactate.       ____________________________________________   FINAL CLINICAL IMPRESSION(S) / ED DIAGNOSES   Final diagnoses:  Community acquired pneumonia, unspecified laterality  Sepsis, due to unspecified organism, unspecified whether acute organ dysfunction present (HCC)      MEDICATIONS GIVEN DURING THIS VISIT:  Medications  cefTRIAXone (ROCEPHIN) 1 g in sodium chloride 0.9 % 100 mL IVPB (has no administration in time range)  doxycycline (VIBRA-TABS) tablet 100 mg (has no administration in time range)  acetaminophen (TYLENOL) tablet 1,000 mg (has no administration in time range)  LORazepam (ATIVAN) injection 1 mg (1 mg Intravenous Given 11/23/18 2001)  LORazepam (ATIVAN) injection 2 mg (2 mg Intravenous Given 11/23/18 2149)  sodium chloride 0.9 % bolus 1,000 mL (0 mLs Intravenous Stopped 11/23/18 2239)     ED Discharge Orders    None       Note:  This document was prepared using Dragon voice recognition software and may include unintentional dictation errors.   Concha SeFunke, Jajaira Ruis E, MD 11/23/18 [phone]

## 2018-11-24 ENCOUNTER — Encounter: Payer: Self-pay | Admitting: Internal Medicine

## 2018-11-24 LAB — BASIC METABOLIC PANEL
Anion gap: 11 (ref 5–15)
BUN: 11 mg/dL (ref 6–20)
CO2: 23 mmol/L (ref 22–32)
Calcium: 8.7 mg/dL — ABNORMAL LOW (ref 8.9–10.3)
Chloride: 100 mmol/L (ref 98–111)
Creatinine, Ser: 0.75 mg/dL (ref 0.61–1.24)
GFR calc Af Amer: >60 mL/min (ref >60)
GFR calc non Af Amer: >60 mL/min (ref >60)
Glucose, Bld: 105 mg/dL — ABNORMAL HIGH (ref 70–99)
Potassium: 3.7 mmol/L (ref 3.5–5.1)
Sodium: 134 mmol/L — ABNORMAL LOW (ref 135–145)

## 2018-11-24 LAB — CBC
HCT: 39 % (ref 39.0–52.0)
HCT: 38.7 % — ABNORMAL LOW (ref 39.0–52.0)
HCT: 41.2 % (ref 39.0–52.0)
Hemoglobin: 13.3 g/dL (ref 13.0–17.0)
Hemoglobin: 13.4 g/dL (ref 13.0–17.0)
Hemoglobin: 14.2 g/dL (ref 13.0–17.0)
MCH: 30.6 pg (ref 26.0–34.0)
MCH: 30.9 pg (ref 26.0–34.0)
MCH: 30.9 pg (ref 26.0–34.0)
MCHC: 34.1 g/dL (ref 30.0–36.0)
MCHC: 34.6 g/dL (ref 30.0–36.0)
MCHC: 34.5 g/dL (ref 30.0–36.0)
MCV: 89.9 fL (ref 80.0–100.0)
MCV: 89.4 fL (ref 80.0–100.0)
MCV: 89.6 fL (ref 80.0–100.0)
Platelets: 133 10*3/uL — ABNORMAL LOW (ref 150–400)
Platelets: 144 10*3/uL — ABNORMAL LOW (ref 150–400)
Platelets: 148 10*3/uL — ABNORMAL LOW (ref 150–400)
RBC: 4.34 MIL/uL (ref 4.22–5.81)
RBC: 4.33 MIL/uL (ref 4.22–5.81)
RBC: 4.6 MIL/uL (ref 4.22–5.81)
RDW: 12 % (ref 11.5–15.5)
RDW: 11.9 % (ref 11.5–15.5)
RDW: 11.8 % (ref 11.5–15.5)
WBC: 4.5 10*3/uL (ref 4.0–10.5)
WBC: 4.3 10*3/uL (ref 4.0–10.5)
WBC: 4.9 10*3/uL (ref 4.0–10.5)
nRBC: 0 % (ref 0.0–0.2)
nRBC: 0 % (ref 0.0–0.2)
nRBC: 0 % (ref 0.0–0.2)

## 2018-11-24 LAB — LACTIC ACID, PLASMA
Lactic Acid, Venous: 1.2 mmol/L (ref 0.5–1.9)
Lactic Acid, Venous: 1.3 mmol/L (ref 0.5–1.9)

## 2018-11-24 LAB — HIV ANTIBODY (ROUTINE TESTING W REFLEX): HIV Screen 4th Generation wRfx: NONREACTIVE

## 2018-11-24 LAB — CREATININE, SERUM
Creatinine, Ser: 0.67 mg/dL (ref 0.61–1.24)
GFR calc Af Amer: >60 mL/min (ref >60)
GFR calc non Af Amer: >60 mL/min (ref >60)

## 2018-11-24 LAB — SARS CORONAVIRUS 2 (TAT 6-24 HRS): SARS Coronavirus 2: NEGATIVE

## 2018-11-24 LAB — PROCALCITONIN
Procalcitonin: <0.1 ng/mL
Procalcitonin: <0.1 ng/mL

## 2018-11-24 MED ORDER — IPRATROPIUM-ALBUTEROL 0.5-2.5 (3) MG/3ML IN SOLN: 3.0000 mL | RESPIRATORY_TRACT | Status: DC | PRN

## 2018-11-24 MED ORDER — GUAIFENESIN-DM 100-10 MG/5ML PO SYRP
5.0000 mL | ORAL_SOLUTION | ORAL | Status: DC | PRN
  Filled 2018-11-24: qty 5

## 2018-11-24 MED ORDER — ASPIRIN EC 81 MG PO TBEC
81.0000 mg | DELAYED_RELEASE_TABLET | Freq: Every day | ORAL | Status: DC
  Administered 2018-11-24 – 2018-11-26 (×3): 81 mg via ORAL
  Filled 2018-11-24 (×4): qty 1

## 2018-11-24 MED ORDER — ACETAMINOPHEN 650 MG RE SUPP
650.0000 mg | Freq: Four times a day (QID) | RECTAL | Status: DC | PRN
  Filled 2018-11-24: qty 1

## 2018-11-24 MED ORDER — ONDANSETRON HCL 4 MG PO TABS
4.0000 mg | ORAL_TABLET | Freq: Four times a day (QID) | ORAL | Status: DC | PRN
  Filled 2018-11-24: qty 1

## 2018-11-24 MED ORDER — ONDANSETRON HCL 4 MG/2ML IJ SOLN: 4.0000 mg | Freq: Four times a day (QID) | INTRAMUSCULAR | Status: DC | PRN

## 2018-11-24 MED ORDER — ACETAMINOPHEN 325 MG PO TABS: 650.0000 mg | ORAL_TABLET | Freq: Four times a day (QID) | ORAL | Status: DC | PRN

## 2018-11-24 MED ORDER — DOXYCYCLINE HYCLATE 100 MG PO TABS
100.0000 mg | ORAL_TABLET | Freq: Two times a day (BID) | ORAL | Status: DC
  Administered 2018-11-24 (×2): 100 mg via ORAL
  Filled 2018-11-24 (×2): qty 1

## 2018-11-24 MED ORDER — ENOXAPARIN SODIUM 40 MG/0.4ML ~~LOC~~ SOLN
40.0000 mg | SUBCUTANEOUS | Status: DC
  Administered 2018-11-24 – 2018-11-26 (×3): 40 mg via SUBCUTANEOUS
  Filled 2018-11-24 (×3): qty 0.4

## 2018-11-24 MED ORDER — HYDRALAZINE HCL 25 MG PO TABS
25.0000 mg | ORAL_TABLET | Freq: Three times a day (TID) | ORAL | Status: DC
  Administered 2018-11-24 – 2018-11-26 (×6): 25 mg via ORAL
  Filled 2018-11-24 (×6): qty 1

## 2018-11-24 MED ORDER — INFLUENZA VAC SPLIT QUAD 0.5 ML IM SUSY
0.5000 mL | PREFILLED_SYRINGE | INTRAMUSCULAR | Status: AC
  Administered 2018-11-25: 0.5 mL via INTRAMUSCULAR
  Filled 2018-11-24: qty 0.5

## 2018-11-24 MED ORDER — SODIUM CHLORIDE 0.9 % IV SOLN
1.0000 g | INTRAVENOUS | Status: DC
  Filled 2018-11-24 (×3): qty 10

## 2018-11-24 NOTE — ED Notes (Signed)
Report off to megan rn. 

## 2018-11-24 NOTE — ED Notes (Signed)
Pt sleeping. Pt waiting on admission bed.  

## 2018-11-24 NOTE — ED Notes (Signed)
Wife brought pt food. She remains at bedside.

## 2018-11-24 NOTE — ED Notes (Signed)
This RN to bedside, introduced self to patient. bloodwork drawn at this time. Pt placed on monitor. VS. Pt resting in bed with eyes closed, pt noted to open eyes and grunt to acknowledge this RN. Will continue to monitor for further patient needs.

## 2018-11-24 NOTE — Progress Notes (Signed)
[place] [facility] [place] NAME: Bobby Williams    MR#:  [id number]  DATE OF BIRTH:  1965-06-13  SUBJECTIVE:  CHIEF COMPLAINT:   Chief Complaint  Patient presents with  . Altered Mental Status  tremulous REVIEW OF SYSTEMS:  Review of Systems  Constitutional: Positive for diaphoresis. Negative for fever, malaise/fatigue and weight loss.  HENT: Negative for ear discharge, ear pain, hearing loss, nosebleeds, sore throat and tinnitus.   Eyes: Negative for blurred vision and pain.  Respiratory: Negative for cough, hemoptysis, shortness of breath and wheezing.   Cardiovascular: Negative for chest pain, palpitations, orthopnea and leg swelling.  Gastrointestinal: Negative for abdominal pain, blood in stool, constipation, diarrhea, heartburn, nausea and vomiting.  Genitourinary: Negative for dysuria, frequency and urgency.  Musculoskeletal: Negative for back pain and myalgias.  Skin: Negative for itching and rash.  Neurological: Positive for tremors. Negative for dizziness, tingling, focal weakness, seizures, weakness and headaches.  Psychiatric/Behavioral: Negative for depression. The patient is not nervous/anxious.     DRUG ALLERGIES:  No Known Allergies VITALS:  Blood pressure (!) 168/103, pulse 88, temperature 98.2 F (36.8 C), temperature source Oral, resp. rate 20, height 6\' 4"  (1.93 m), weight 117.9 kg, SpO2 99 %. PHYSICAL EXAMINATION:  Physical Exam HENT:     Head: Normocephalic and atraumatic.  Eyes:     Conjunctiva/sclera: Conjunctivae normal.     Pupils: Pupils are equal, round, and reactive to light.  Neck:     Musculoskeletal: Normal range of motion and neck supple.     Thyroid: No thyromegaly.     Trachea: No tracheal deviation.  Cardiovascular:     Rate and Rhythm: Normal rate and regular rhythm.     Heart sounds: Normal heart sounds.  Pulmonary:     Effort: Pulmonary effort is normal. No respiratory distress.     Breath  sounds: Normal breath sounds. No wheezing.  Chest:     Chest wall: No tenderness.  Abdominal:     General: Bowel sounds are normal. There is no distension.     Palpations: Abdomen is soft.     Tenderness: There is no abdominal tenderness.  Musculoskeletal: Normal range of motion.  Skin:    General: Skin is warm and dry.     Findings: No rash.  Neurological:     Mental Status: He is alert and oriented to person, place, and time.     Cranial Nerves: No cranial nerve deficit.     Motor: Tremor present.    LABORATORY PANEL:  Male CBC Recent Labs  Lab 11/24/18 0547  WBC 4.3  HGB 13.4  HCT 38.7*  PLT 144*   ------------------------------------------------------------------------------------------------------------------ Chemistries  Recent Labs  Lab 11/23/18 1838  11/24/18 0547  NA 135  --  134*  K 3.9  --  3.7  CL 96*  --  100  CO2 26  --  23  GLUCOSE 112*  --  105*  BUN 10  --  11  CREATININE 0.73   < > 0.75  CALCIUM 9.6  --  8.7*  AST 43*  --   --   ALT 44  --   --   ALKPHOS 57  --   --   BILITOT 0.8  --   --    < > = values in this interval not displayed.   RADIOLOGY:  Dg Chest 1 View  Result Date: 11/23/2018 CLINICAL DATA:  Shortness of breath EXAM: CHEST  1 VIEW COMPARISON:  Radiograph 01/27/2016 FINDINGS: Low lung volumes. Basilar areas of atelectasis. More patchy airspace opacity in the right lung and periphery of the left lung base. No pneumothorax or effusion. Cardiomediastinal contours are unremarkable for technique. No acute osseous or soft tissue abnormality. IMPRESSION: Low lung volumes with bibasilar atelectasis. More patchy airspace opacity in the right lung and periphery of the left lung base, suspicious for multifocal pneumonia in the proper clinical setting. Electronically Signed   By: Kreg Shropshire M.D.   On: 11/23/2018 22:34   Ct Head Wo Contrast  Result Date: 11/23/2018 CLINICAL DATA:  Acute onset of confusion today. Disorientation. EXAM: CT  HEAD WITHOUT CONTRAST TECHNIQUE: Contiguous axial images were obtained from the base of the skull through the vertex without intravenous contrast. COMPARISON:  None. FINDINGS: Brain: The brain shows a normal appearance without evidence of malformation, atrophy, old or acute small or large vessel infarction, mass lesion, hemorrhage, hydrocephalus or extra-axial collection. Vascular: No hyperdense vessel. No evidence of atherosclerotic calcification. Skull: Normal. No traumatic finding. No focal bone lesion. Sinuses/Orbits: Mild mucosal thickening of the paranasal sinuses. No advanced inflammatory change. Orbits negative. Other: None significant IMPRESSION: Normal head CT except for mild mucosal thickening of the paranasal sinuses. Electronically Signed   By: Paulina Fusi M.D.   On: 11/23/2018 19:18   ASSESSMENT AND PLAN:   * Sepsis (HCC) - present on admission. Continue IV antibiotics, source pneumonia, however we will check a procalcitonin as well as his white blood cell count was not elevated.  Coronavirus test is neg.  Lactic acid normal, blood cultures neg.  Blood pressure has been stable.   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  Follow-up on procalcitonin and Covid neg  * Uncontrolled hypertension -patient has not taken any antihypertensive medications at home for the last little while.  As needed antihypertensives with blood pressure goal less than 160/100 - will add hydralazine - this is likely due to alcohol withdrawal     All the records are reviewed and case discussed with Care Management/Social Worker. Management plans discussed with the patient, nursing and they are in agreement.  CODE STATUS: Full Code  TOTAL TIME TAKING CARE OF THIS PATIENT: 35 minutes.   More than 50% of the time was spent in counseling/coordination of care: YES  POSSIBLE D/C IN 1-2 DAYS, DEPENDING ON  CLINICAL CONDITION.   Delfino Lovett M.D on 11/24/2018 at 4:16 PM  Between 7am to 6pm - Pager - [phone]  After 6pm go to [email or web address] - Social research officer, government  [facility]  Office  [phone]  CC: Primary care physician; Patient, No Pcp Per  Note: This dictation was prepared with Dragon dictation along with smaller phrase technology. Any transcriptional errors that result from this process are unintentional.

## 2018-11-24 NOTE — ED Notes (Signed)
Pt resting, NAD. Has improved since last dose of ativan.  Unlabored. No needs at this time.

## 2018-11-25 ENCOUNTER — Inpatient Hospital Stay: Payer: No Typology Code available for payment source

## 2018-11-25 LAB — CBC
HCT: 41.5 % (ref 39.0–52.0)
Hemoglobin: 14.5 g/dL (ref 13.0–17.0)
MCH: 30.7 pg (ref 26.0–34.0)
MCHC: 34.9 g/dL (ref 30.0–36.0)
MCV: 87.7 fL (ref 80.0–100.0)
Platelets: 150 10*3/uL (ref 150–400)
RBC: 4.73 MIL/uL (ref 4.22–5.81)
RDW: 11.7 % (ref 11.5–15.5)
WBC: 5.4 10*3/uL (ref 4.0–10.5)
nRBC: 0 % (ref 0.0–0.2)

## 2018-11-25 LAB — COMPREHENSIVE METABOLIC PANEL
ALT: 37 U/L (ref 0–44)
AST: 35 U/L (ref 15–41)
Albumin: 4.2 g/dL (ref 3.5–5.0)
Alkaline Phosphatase: 50 U/L (ref 38–126)
Anion gap: 8 (ref 5–15)
BUN: 10 mg/dL (ref 6–20)
CO2: 26 mmol/L (ref 22–32)
Calcium: 8.8 mg/dL — ABNORMAL LOW (ref 8.9–10.3)
Chloride: 102 mmol/L (ref 98–111)
Creatinine, Ser: 0.66 mg/dL (ref 0.61–1.24)
GFR calc Af Amer: >60 mL/min (ref >60)
GFR calc non Af Amer: >60 mL/min (ref >60)
Glucose, Bld: 109 mg/dL — ABNORMAL HIGH (ref 70–99)
Potassium: 3.6 mmol/L (ref 3.5–5.1)
Sodium: 136 mmol/L (ref 135–145)
Total Bilirubin: 0.9 mg/dL (ref 0.3–1.2)
Total Protein: 7.3 g/dL (ref 6.5–8.1)

## 2018-11-25 LAB — PROCALCITONIN: Procalcitonin: <0.1 ng/mL

## 2018-11-25 MED ORDER — DOXYCYCLINE HYCLATE 100 MG PO TABS
100.0000 mg | ORAL_TABLET | Freq: Two times a day (BID) | ORAL | Status: DC
  Administered 2018-11-25 – 2018-11-26 (×2): 100 mg via ORAL
  Filled 2018-11-25 (×2): qty 1

## 2018-11-25 MED ORDER — SENNOSIDES-DOCUSATE SODIUM 8.6-50 MG PO TABS
2.0000 | ORAL_TABLET | Freq: Two times a day (BID) | ORAL | Status: DC
  Administered 2018-11-25 – 2018-11-26 (×3): 2 via ORAL
  Filled 2018-11-25 (×3): qty 2

## 2018-11-25 NOTE — Progress Notes (Signed)
[place] at [place] NAME: Bobby Williams    MR#:  [id number]  DATE OF BIRTH:  1965/04/14  SUBJECTIVE:  CHIEF COMPLAINT:   Chief Complaint  Patient presents with  . Altered Mental Status  less tremulous than y'day. Family concerned about AMS (underlying seizure vs stroke) REVIEW OF SYSTEMS:  Review of Systems  Constitutional: Negative for diaphoresis, fever, malaise/fatigue and weight loss.  HENT: Negative for ear discharge, ear pain, hearing loss, nosebleeds, sore throat and tinnitus.   Eyes: Negative for blurred vision and pain.  Respiratory: Negative for cough, hemoptysis, shortness of breath and wheezing.   Cardiovascular: Negative for chest pain, palpitations, orthopnea and leg swelling.  Gastrointestinal: Negative for abdominal pain, blood in stool, constipation, diarrhea, heartburn, nausea and vomiting.  Genitourinary: Negative for dysuria, frequency and urgency.  Musculoskeletal: Negative for back pain and myalgias.  Skin: Negative for itching and rash.  Neurological: Positive for tremors. Negative for dizziness, tingling, focal weakness, seizures, weakness and headaches.  Psychiatric/Behavioral: Negative for depression. The patient is not nervous/anxious.     DRUG ALLERGIES:  No Known Allergies VITALS:  Blood pressure (!) 159/95, pulse 96, temperature 98.1 F (36.7 C), temperature source Oral, resp. rate 19, height 6\' 4"  (1.93 m), weight 117.9 kg, SpO2 98 %. PHYSICAL EXAMINATION:  Physical Exam HENT:     Head: Normocephalic and atraumatic.  Eyes:     Conjunctiva/sclera: Conjunctivae normal.     Pupils: Pupils are equal, round, and reactive to light.  Neck:     Musculoskeletal: Normal range of motion and neck supple.     Thyroid: No thyromegaly.     Trachea: No tracheal deviation.  Cardiovascular:     Rate and Rhythm: Normal rate and regular rhythm.     Heart sounds: Normal heart sounds.  Pulmonary:     Effort:  Pulmonary effort is normal. No respiratory distress.     Breath sounds: Normal breath sounds. No wheezing.  Chest:     Chest wall: No tenderness.  Abdominal:     General: Bowel sounds are normal. There is no distension.     Palpations: Abdomen is soft.     Tenderness: There is no abdominal tenderness.  Musculoskeletal: Normal range of motion.  Skin:    General: Skin is warm and dry.     Findings: No rash.  Neurological:     Mental Status: He is alert and oriented to person, place, and time.     Cranial Nerves: No cranial nerve deficit.     Motor: Tremor present.    LABORATORY PANEL:  Male CBC Recent Labs  Lab 11/25/18 0456  WBC 5.4  HGB 14.5  HCT 41.5  PLT 150   ------------------------------------------------------------------------------------------------------------------ Chemistries  Recent Labs  Lab 11/25/18 0456  NA 136  K 3.6  CL 102  CO2 26  GLUCOSE 109*  BUN 10  CREATININE 0.66  CALCIUM 8.8*  AST 35  ALT 37  ALKPHOS 50  BILITOT 0.9   RADIOLOGY:  Dg Chest 2 View  Result Date: 11/25/2018 CLINICAL DATA:  Shortness of breath, pneumonia, hypertension EXAM: CHEST - 2 VIEW COMPARISON:  11/23/2018 FINDINGS: Improved lung volumes compared to prior examination, with some subtle heterogeneous airspace opacity appearing to persist in the right middle lobe. The heart and mediastinum are normal. The osseous structures are unremarkable. IMPRESSION: Improved lung volumes compared to prior examination, with some subtle heterogeneous airspace opacity appearing to persist in the right middle lobe. Findings may reflect  infection. No new airspace opacity. Electronically Signed   By: Lauralyn Primes M.D.   On: 11/25/2018 12:28   Mr Brain Wo Contrast  Result Date: 11/25/2018 CLINICAL DATA:  Encephalopathy. Alcohol withdrawal. EXAM: MRI HEAD WITHOUT CONTRAST TECHNIQUE: Multiplanar, multiecho pulse sequences of the brain and surrounding structures were obtained without  intravenous contrast. COMPARISON:  Head CT 11/23/2018 FINDINGS: Brain: There is no evidence of acute infarct, intracranial hemorrhage, mass, midline shift, or extra-axial fluid collection. A small chronic infarct is noted medially in the right parietal lobe. Small foci of T2 hyperintensity scattered throughout the cerebral white matter bilaterally are mildly advanced for age and nonspecific but compatible with chronic small vessel ischemic disease. There is mild cerebral and cerebellar atrophy. Vascular: Major intracranial vascular flow voids are preserved. Skull and upper cervical spine: Unremarkable bone marrow signal. Sinuses/Orbits: Unremarkable orbits. Mild mucosal thickening throughout the paranasal sinuses. Left maxillary sinus mucous retention cyst. Clear mastoid air cells. Other: None. IMPRESSION: 1. No acute intracranial abnormality. 2. Mild chronic small vessel ischemic disease and cerebral atrophy. 3. Small chronic right parietal infarct. Electronically Signed   By: Sebastian Ache M.D.   On: 11/25/2018 12:08   ASSESSMENT AND PLAN:   * Sepsis (HCC) - present on admission. Could have pneumonia although normla procalcitonin as well as his white blood cell count which is not elevated.  Coronavirus test is neg.  Lactic acid normal, blood cultures neg.  Blood pressure has been stable. Repeat CXR  Shows still rt middle lobe infiltrate - could be aspiration. 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 Management plans discussed with the patient, nursing, brother over phone and they are in agreement.  CODE  STATUS: Full Code  TOTAL TIME TAKING CARE OF THIS PATIENT: 35 minutes.   More than 50% of the time was spent in counseling/coordination of care: YES  POSSIBLE D/C IN 1 DAYS, DEPENDING ON CLINICAL CONDITION.   Delfino Lovett M.D on 11/25/2018 at 5:40 PM  Between 7am to 6pm - Pager - [phone]  After 6pm go to [email or web address] - Social research officer, government  [facility]  Office  [phone]  CC: Primary care physician; Patient, No Pcp Per  Note: This dictation was prepared with Dragon dictation along with smaller phrase technology. Any transcriptional errors that result from this process are unintentional.

## 2018-11-25 NOTE — Progress Notes (Signed)
MD notified: The patient indicates he has not seen a primary care provider since his last admission. He was told he has high blood pressure and has not been able to get his prescription filled. He wants to know if he could get a refill on BP meds when he gets discharged and also possible referal or sites where he can obtaine a medical doctor in [place] instead of [place].

## 2018-11-25 NOTE — TOC Initial Note (Signed)
Transition of Care [facility]) - Initial/Assessment Note    Patient Details  Name: Bobby Williams MRN: [id number] Date of Birth: 1965-08-28  Transition of Care [facility]) CM/SW Contact:    Beverly Sessions, RN Phone Number: 11/25/2018, 4:42 PM  Clinical Narrative:                    [facility] consult for "No PCP. Would like help establishing PCP locally".  Per attending MD he will take care of it.  RNCM confirmed that I did not need to purse setting up a PCP.    Please consult RNCM if there are any additional needs      Patient Goals and CMS Choice        Expected Discharge Plan and Services                                                Prior Living Arrangements/Services                       Activities of Daily Living Home Assistive Devices/Equipment: Nebulizer ADL Screening (condition at time of admission) Patient's cognitive ability adequate to safely complete daily activities?: Yes Is the patient deaf or have difficulty hearing?: No Does the patient have difficulty seeing, even when wearing glasses/contacts?: No Does the patient have difficulty concentrating, remembering, or making decisions?: No Patient able to express need for assistance with ADLs?: Yes Does the patient have difficulty dressing or bathing?: No Independently performs ADLs?: Yes (appropriate for developmental age) Does the patient have difficulty walking or climbing stairs?: No Weakness of Legs: None Weakness of Arms/Hands: None  Permission Sought/Granted                  Emotional Assessment              Admission diagnosis:  Community acquired pneumonia, unspecified laterality [J18.9] Sepsis, due to unspecified organism, unspecified whether acute organ dysfunction present [facility]) [A41.9] Patient Active Problem List   Diagnosis Date Noted  . Sepsis ([place]) 11/23/2018  . Uncontrolled hypertension 11/23/2018  . Alcohol withdrawal ([place]) 01/22/2016  . Rib fracture 01/22/2016   . CAP (community acquired pneumonia) 01/22/2016   PCP:  Patient, No Pcp Per Pharmacy:   [facility], [address] [place] [address] Phone: [phone] Fax: [phone]  [id number] - [place], [place] [address] Phone: [phone] Fax: [phone]     Social Determinants of Health (SDOH) Interventions    Readmission Risk Interventions No flowsheet data found.

## 2018-11-26 LAB — BASIC METABOLIC PANEL
Anion gap: 11 (ref 5–15)
BUN: 11 mg/dL (ref 6–20)
CO2: 24 mmol/L (ref 22–32)
Calcium: 9.1 mg/dL (ref 8.9–10.3)
Chloride: 102 mmol/L (ref 98–111)
Creatinine, Ser: 0.79 mg/dL (ref 0.61–1.24)
GFR calc Af Amer: >60 mL/min (ref >60)
GFR calc non Af Amer: >60 mL/min (ref >60)
Glucose, Bld: 111 mg/dL — ABNORMAL HIGH (ref 70–99)
Potassium: 3.8 mmol/L (ref 3.5–5.1)
Sodium: 137 mmol/L (ref 135–145)

## 2018-11-26 LAB — CBC
HCT: 43.8 % (ref 39.0–52.0)
Hemoglobin: 14.9 g/dL (ref 13.0–17.0)
MCH: 30.4 pg (ref 26.0–34.0)
MCHC: 34 g/dL (ref 30.0–36.0)
MCV: 89.4 fL (ref 80.0–100.0)
Platelets: 161 10*3/uL (ref 150–400)
RBC: 4.9 MIL/uL (ref 4.22–5.81)
RDW: 11.7 % (ref 11.5–15.5)
WBC: 5.4 10*3/uL (ref 4.0–10.5)
nRBC: 0 % (ref 0.0–0.2)

## 2018-11-26 LAB — PROCALCITONIN: Procalcitonin: <0.1 ng/mL

## 2018-11-26 MED ORDER — CHLORTHALIDONE 25 MG PO TABS: 25.0000 mg | ORAL_TABLET | Freq: Every day | ORAL | 1 refills | Status: DC

## 2018-11-26 MED ORDER — AMLODIPINE BESYLATE 5 MG PO TABS: 5.0000 mg | ORAL_TABLET | Freq: Every day | ORAL | 0 refills | Status: DC

## 2018-11-26 MED ORDER — DOXYCYCLINE HYCLATE 100 MG PO TABS: 100.0000 mg | ORAL_TABLET | Freq: Two times a day (BID) | ORAL | 0 refills | Status: DC

## 2018-11-26 NOTE — Progress Notes (Signed)
Bobby Williams  A and O x 4 VSS. Pt tolerating diet well. No complaints of pain or nausea. IV removed intact, prescriptions given. Pt voices understanding of discharge instructions with no further questions. Pt discharged via wheelchair with axillary.   Allergies as of 11/26/2018   No Known Allergies     Medication List    TAKE these medications   amLODipine 5 MG tablet Commonly known as: NORVASC Take 1 tablet (5 mg total) by mouth daily.   aspirin EC 81 MG tablet Take 81 mg by mouth daily.   chlorthalidone 25 MG tablet Commonly known as: HYGROTON Take 1 tablet (25 mg total) by mouth daily.   doxycycline 100 MG tablet Commonly known as: VIBRA-TABS Take 1 tablet (100 mg total) by mouth every 12 (twelve) hours.       Vitals:   11/25/18 1943 11/26/18 0514  BP: (!) 171/96 (!) 148/96  Pulse: 93 83  Resp: 16 20  Temp: 98.9 F (37.2 C) 98.8 F (37.1 C)  SpO2: 97% 96%    Bobby Williams Bobby Williams

## 2018-11-26 NOTE — Discharge Instructions (Signed)

## 2018-11-27 NOTE — Discharge Summary (Signed)
[facility] [place] [facility]   PATIENT NAME: Bobby Williams    MR#:  [id number]  DATE OF BIRTH:  September 26, 1965  DATE OF ADMISSION:  11/23/2018   ADMITTING PHYSICIAN: Oralia Manis, MD  DATE OF DISCHARGE: 11/26/2018 12:38 PM  PRIMARY CARE PHYSICIAN: Doren Custard, FNP   ADMISSION DIAGNOSIS:  Community acquired pneumonia, unspecified laterality [J18.9] Sepsis, due to unspecified organism, unspecified whether acute organ dysfunction present (HCC) [A41.9] DISCHARGE DIAGNOSIS:  Principal Problem:   Sepsis (HCC) Active Problems:   Alcohol withdrawal (HCC)   CAP (community acquired pneumonia)   Uncontrolled hypertension  SECONDARY DIAGNOSIS:   Past Medical History:  Diagnosis Date  . Anxiety   . Hypertension   . Pneumonia    HOSPITAL COURSE:  * Sepsis (HCC) - present on admission. possible aspiration event and mild pneumoniaCoronavirus test is neg. Lactic acid normal, blood cultures neg. Blood pressure has been stable. Repeat CXR shows still rt middle lobe infiltrate - could be aspiration. Will keep him on doxycycline at D/C  * Alcohol withdrawal (HCC) - Treated with CIWA protocol  *CAP (community acquired pneumonia) -Doxy  *Uncontrolled hypertension - hasn't had refill of his BP meds. I've given restarted his chronic BP meds and sent prescriptions for same.  * AMS: MRI brain neg for acute patho, mental status close to baseline, likely stress/mental health contributing to majority of his current health decline  [facility] PCP requested for [facility] per patient/family request.  DISCHARGE CONDITIONS:  stable CONSULTS OBTAINED:   DRUG ALLERGIES:  No Known Allergies DISCHARGE MEDICATIONS:   Allergies as of 11/26/2018   No Known Allergies     Medication List    TAKE these medications   amLODipine 5 MG tablet Commonly known as: NORVASC Take 1 tablet (5 mg total) by mouth daily.   aspirin EC 81 MG tablet Take 81 mg by mouth daily.    chlorthalidone 25 MG tablet Commonly known as: HYGROTON Take 1 tablet (25 mg total) by mouth daily.   doxycycline 100 MG tablet Commonly known as: VIBRA-TABS Take 1 tablet (100 mg total) by mouth every 12 (twelve) hours.        DISCHARGE INSTRUCTIONS:   DIET:  Cardiac diet DISCHARGE CONDITION:  Stable ACTIVITY:  Activity as tolerated OXYGEN:  Home Oxygen: No.  Oxygen Delivery: room air DISCHARGE LOCATION:  home   If you experience worsening of your admission symptoms, develop shortness of breath, life threatening emergency, suicidal or homicidal thoughts you must seek medical attention immediately by calling 911 or calling your MD immediately  if symptoms less severe.  You Must read complete instructions/literature along with all the possible adverse reactions/side effects for all the Medicines you take and that have been prescribed to you. Take any new Medicines after you have completely understood and accpet all the possible adverse reactions/side effects.   Please note  You were cared for by a hospitalist during your hospital stay. If you have any questions about your discharge medications or the care you received while you were in the hospital after you are discharged, you can call the unit and asked to speak with the hospitalist on call if the hospitalist that took care of you is not available. Once you are discharged, your primary care physician will handle any further medical issues. Please note that NO REFILLS for any discharge medications will be authorized once you are discharged, as it is imperative that you return to your primary care physician (or establish a relationship  with a primary care physician if you do not have one) for your aftercare needs so that they can reassess your need for medications and monitor your lab values.    On the day of Discharge:  VITAL SIGNS:  Blood pressure (!) 148/96, pulse 83, temperature 98.8 F (37.1 C), temperature source Oral,  resp. rate 20, height 6\' 4"  (1.93 m), weight 117.9 kg, SpO2 96 %. PHYSICAL EXAMINATION:  GENERAL:  53 y.o.-year-old patient lying in the bed with no acute distress.  EYES: Pupils equal, round, reactive to light and accommodation. No scleral icterus. Extraocular muscles intact.  HEENT: Head atraumatic, normocephalic. Oropharynx and nasopharynx clear.  NECK:  Supple, no jugular venous distention. No thyroid enlargement, no tenderness.  LUNGS: Normal breath sounds bilaterally, no wheezing, rales,rhonchi or crepitation. No use of accessory muscles of respiration.  CARDIOVASCULAR: S1, S2 normal. No murmurs, rubs, or gallops.  ABDOMEN: Soft, non-tender, non-distended. Bowel sounds present. No organomegaly or mass.  EXTREMITIES: No pedal edema, cyanosis, or clubbing.  NEUROLOGIC: Cranial nerves II through XII are intact. Muscle strength 5/5 in all extremities. Sensation intact. Gait not checked.  PSYCHIATRIC: The patient is alert and oriented x 3.  SKIN: No obvious rash, lesion, or ulcer.  DATA REVIEW:   CBC Recent Labs  Lab 11/26/18 0438  WBC 5.4  HGB 14.9  HCT 43.8  PLT 161    Chemistries  Recent Labs  Lab 11/25/18 0456 11/26/18 0438  NA 136 137  K 3.6 3.8  CL 102 102  CO2 26 24  GLUCOSE 109* 111*  BUN 10 11  CREATININE 0.66 0.79  CALCIUM 8.8* 9.1  AST 35  --   ALT 37  --   ALKPHOS 50  --   BILITOT 0.9  --      Follow-up Information    Hubbard Hartshorn, FNP. Go on 12/03/2018.   Specialty: Family Medicine Why: 10am ppointment Contact information: [place] [address] [phone]           Management plans discussed with the patient, family (d/w Brother Ladanian Kelter) and they are in agreement.  CODE STATUS: Prior   TOTAL TIME TAKING CARE OF THIS PATIENT: 45 minutes.    Max Sane M.D on 11/27/2018 at 2:35 PM  Between 7am to 6pm - Pager - [phone]  After 6pm go to [email or web address] - Proofreader  [facility]  [place] [facility]  Office  [phone]  CC: Primary care physician; Hubbard Hartshorn, FNP   Note: This dictation was prepared with Dragon dictation along with smaller phrase technology. Any transcriptional errors that result from this process are unintentional.

## 2018-11-29 LAB — CULTURE, BLOOD (ROUTINE X 2)
Culture: NO GROWTH
Culture: NO GROWTH
Special Requests: ADEQUATE
Special Requests: ADEQUATE

## 2018-12-03 ENCOUNTER — Encounter: Payer: Self-pay | Admitting: Family Medicine

## 2018-12-03 ENCOUNTER — Other Ambulatory Visit: Payer: Self-pay

## 2018-12-03 ENCOUNTER — Ambulatory Visit (INDEPENDENT_AMBULATORY_CARE_PROVIDER_SITE_OTHER): Payer: 59 | Admitting: Family Medicine

## 2018-12-03 VITALS — BP 142/86 | HR 84 | Temp 97.9°F | Resp 18 | Ht 76.0 in | Wt 261.7 lb

## 2018-12-03 DIAGNOSIS — A419 Sepsis, unspecified organism: Secondary | ICD-10-CM | POA: Diagnosis not present

## 2018-12-03 DIAGNOSIS — Z125 Encounter for screening for malignant neoplasm of prostate: Secondary | ICD-10-CM

## 2018-12-03 DIAGNOSIS — J189 Pneumonia, unspecified organism: Secondary | ICD-10-CM | POA: Diagnosis not present

## 2018-12-03 DIAGNOSIS — F10230 Alcohol dependence with withdrawal, uncomplicated: Secondary | ICD-10-CM

## 2018-12-03 DIAGNOSIS — Z7689 Persons encountering health services in other specified circumstances: Secondary | ICD-10-CM

## 2018-12-03 DIAGNOSIS — I10 Essential (primary) hypertension: Secondary | ICD-10-CM

## 2018-12-03 DIAGNOSIS — F419 Anxiety disorder, unspecified: Secondary | ICD-10-CM | POA: Insufficient documentation

## 2018-12-03 DIAGNOSIS — Z23 Encounter for immunization: Secondary | ICD-10-CM

## 2018-12-03 DIAGNOSIS — Z1159 Encounter for screening for other viral diseases: Secondary | ICD-10-CM

## 2018-12-03 DIAGNOSIS — Z1212 Encounter for screening for malignant neoplasm of rectum: Secondary | ICD-10-CM

## 2018-12-03 DIAGNOSIS — F10930 Alcohol use, unspecified with withdrawal, uncomplicated: Secondary | ICD-10-CM

## 2018-12-03 DIAGNOSIS — Z1211 Encounter for screening for malignant neoplasm of colon: Secondary | ICD-10-CM

## 2018-12-03 DIAGNOSIS — Z13220 Encounter for screening for lipoid disorders: Secondary | ICD-10-CM

## 2018-12-03 DIAGNOSIS — F17290 Nicotine dependence, other tobacco product, uncomplicated: Secondary | ICD-10-CM

## 2018-12-03 MED ORDER — CHLORTHALIDONE 25 MG PO TABS: 25.0000 mg | ORAL_TABLET | Freq: Every day | ORAL | 1 refills | Status: DC

## 2018-12-03 MED ORDER — AMLODIPINE BESYLATE 5 MG PO TABS: 10.0000 mg | ORAL_TABLET | Freq: Every day | ORAL | 0 refills | Status: DC

## 2018-12-03 NOTE — Progress Notes (Signed)
Name: Bobby Williams   MRN: [id number]    DOB: [birth year]   Date:12/03/2018       Progress Note  Subjective  Chief Complaint  Chief Complaint  Patient presents with  . Establish Care  . Hospitalization Follow-up    pneumonia, elevated BP  . Hypertension    HPI   Pt presents to establish care and for hospital discharge follow up:  He was admitted on 11/23/2018 for sepsis secondary to RT middle lobe CAP.  He has been improving, but today he woke up and had some phlegm and mild cough when he woke up.  No fevers/chills, shortness of breath, chest pain.  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  Appetite is good, drinking plenty of water.  - Sepsis (HCC)/Right Middle Lobe CAP- present on admission.possible aspiration event and mild pneumoniaCoronavirus test is neg. Lactic acid normal, blood cultures neg, CBC showed normal WBC the entire admission. No hypotension.  Repeat CXR showed still rt middle lobe infiltrate - thought to be possible aspiration PNA.  He completed outpatient doxy course.  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  Drinks 1/2-1 bottle of white wine a night. (usually about 3-4 glasses).  Did CIWA protocol while in the hospital and returned to use upon discharge.  He does take B12 supplement.  -Uncontrolled hypertension - hasn't had refill of his BP meds.  Restarted his chronic BP meds and sent prescriptions for same.  He has drank 1L Diet pepsi this morning, and will typically drink a 2L throughout the day.  Denies chest pain, headaches; gets some trace BLE edema. Taking 5mg  Amlodipine and 25mg  Chlorthalidone. BP is not quite at goal, so we will increase amlodipine to 10mg  today. Taking 81mg  ASA daily.  - Nicotine Dependence: Started dipping at age 89, uses nicotine lozenges at 4mg  - uses about 8 a day.  He has never smoked.  Was using  pouches up to 2 years ago.    -  AMS: MRI brain neg for acute patho, does have some small vessel ischemia and cerebral atrophy - discussed with patient today. Also had small chronic right parietal infarct. Per discharging MD from hospitalist group, mental status has remained close to baseline, likely stress/mental health contributing to majority of his current health decline  Patient Active Problem List   Diagnosis Date Noted  . Anxiety 12/03/2018  . Sepsis (HCC) 11/23/2018  . Uncontrolled hypertension 11/23/2018  . Alcohol withdrawal (HCC) 01/22/2016  . Rib fracture 01/22/2016  . CAP (community acquired pneumonia) 01/22/2016    Past Surgical History:  Procedure Laterality Date  . NO PAST SURGERIES      Family History  Problem Relation Age of Onset  . Diabetes Neg Hx     Social History   Socioeconomic History  . Marital status: Married    Spouse name: Kiimberly  . Number of children: 1  . Years of education: Not on file  . Highest education level: Not on file  Occupational History  . Not on file  Social Needs  . Financial resource strain: Not hard at all  . Food insecurity    Worry: Never true    Inability: Never true  . Transportation needs    Medical: No    Non-medical: No  Tobacco Use  . Smoking status: Never Smoker  . Smokeless tobacco: Former 11/25/2018  Types: Chew  . Tobacco comment: nicotine Lozenges 4mg  4-6 times  Substance and Sexual Activity  . Alcohol use: Yes    Alcohol/week: 28.0 standard drinks    Types: 28 Glasses of wine per week    Comment: 4 glasses per night  . Drug use: Never  . Sexual activity: Yes    Partners: Female  Lifestyle  . Physical activity    Days per week: 0 days    Minutes per session: 0 min  . Stress: Only a little  Relationships  . Social connections    Talks on phone: More than three times a week    Gets together: Never    Attends religious service: 1 to 4 times per year    Active member of club or organization: No     Attends meetings of clubs or organizations: Never    Relationship status: Married  . Intimate partner violence    Fear of current or ex partner: No    Emotionally abused: No    Physically abused: No    Forced sexual activity: No  Other Topics Concern  . Not on file  Social History Narrative   Right handed      Highest level of edu-bachelors      Lives with wife and one child     Current Outpatient Medications:  .  amLODipine (NORVASC) 5 MG tablet, Take 2 tablets (10 mg total) by mouth daily., Disp: 30 tablet, Rfl: 0 .  aspirin EC 81 MG tablet, Take 81 mg by mouth daily., Disp: , Rfl:  .  chlorthalidone (HYGROTON) 25 MG tablet, Take 1 tablet (25 mg total) by mouth daily., Disp: 90 tablet, Rfl: 1  No Known Allergies  I personally reviewed active problem list, medication list, allergies, health maintenance, notes from last encounter, lab results with the patient/caregiver today.   ROS  Constitutional: Negative for fever or weight change.  Respiratory: See   Cardiovascular: Negative for chest pain or palpitations.  Gastrointestinal: Negative for abdominal pain, no bowel changes.  Musculoskeletal: Negative for gait problem or joint swelling.  Skin: Negative for rash.  Neurological: Negative for dizziness or headache.  No other specific complaints in a complete review of systems (except as listed in HPI above).  Objective  Vitals:   12/03/18 0954 12/03/18 1043  BP: (!) 158/86 (!) 142/86  Pulse: (!) 102 84  Resp: 18   Temp: 97.9 F (36.6 C)   TempSrc: Temporal   SpO2: 98%   Weight: 261 lb 11.2 oz (118.7 kg)   Height: 6\' 4"  (1.93 m)    Body mass index is 31.86 kg/m.  Physical Exam  Constitutional: Patient appears well-developed and well-nourished. No distress.  HENT: Head: Normocephalic and atraumatic.  Eyes: Conjunctivae and EOM are normal. No scleral icterus.   Neck: Normal range of motion. Neck supple. No JVD present. No thyromegaly present.  Cardiovascular:  Normal rate, regular rhythm and normal heart sounds.  No murmur heard. No BLE edema. Pulmonary/Chest: Effort normal and breath sounds normal. No respiratory distress. Musculoskeletal: Normal range of motion, no joint effusions. No gross deformities Neurological: Pt is alert and oriented to person, place, and time. No cranial nerve deficit. Coordination, balance, strength, speech and gait are normal.  Skin: Skin is warm and dry. No rash noted. No erythema.  Psychiatric: Patient has a normal mood and affect. behavior is normal. Judgment and thought content normal.  No results found for this or any previous visit (from the past 72 hour(s)).  PHQ2/9: Depression screen PHQ 2/9 12/03/2018  Decreased Interest 0  Down, Depressed, Hopeless 0  PHQ - 2 Score 0  Altered sleeping 0  Tired, decreased energy 0  Change in appetite 0  Feeling bad or failure about yourself  0  Trouble concentrating 0  Moving slowly or fidgety/restless 0  Suicidal thoughts 0  PHQ-9 Score 0  Difficult doing work/chores Not difficult at all   PHQ-2/9 Result is negative.    Fall Risk: Fall Risk  12/03/2018 10/14/2018  Falls in the past year? 0 0  Number falls in past yr: 0 0  Injury with Fall? 0 0  Follow up Falls evaluation completed Falls evaluation completed    Assessment & Plan  1. Uncontrolled hypertension - BP remains above normal range on recheck, increast amlodipine to 10mg  daily, recheck in 1-2 weeks. - chlorthalidone (HYGROTON) 25 MG tablet; Take 1 tablet (25 mg total) by mouth daily.  Dispense: 90 tablet; Refill: 1 - amLODipine (NORVASC) 5 MG tablet; Take 2 tablets (10 mg total) by mouth daily.  Dispense: 30 tablet; Refill: 0 - COMPLETE METABOLIC PANEL WITH GFR  2. Community acquired pneumonia, unspecified laterality - Had some phlegm this morning upon waking, is concerned for recurrence; he is finished with doxy course, repeat CXR unlikely to be helpful this early after completing ABX.  He will however  monitor symptoms closely, and if symptoms recur or worsen, we will send for additional imaging.  Check CBC to evaluate for leukocytosis. - CBC with Differential/Platelet  3. Sepsis, due to unspecified organism, unspecified whether acute organ dysfunction present (HCC) - CBC with Differential/Platelet  4. 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  HE is agreeable to try to cut back - B12 and Folate Panel - Lipid panel  5. Other tobacco product nicotine dependence, uncomplicated - Using nicotine lozenges at approx 8 of the 4mg  lozenges per day - recommend cutting down to 6 per day  6. Need for hepatitis C screening test - Hepatitis C antibody  7. Lipid screening - Lipid panel  8. Prostate cancer screening - PSA  9. Need for Tdap vaccination - Tdap vaccine greater than or equal to 7yo IM  10. Screening for colorectal cancer - Ambulatory referral to Gastroenterology  11. Encounter to establish care

## 2018-12-03 NOTE — Addendum Note (Signed)
Addended by: Hubbard Hartshorn on: 12/03/2018 03:24 PM   Modules accepted: Level of Service

## 2018-12-06 ENCOUNTER — Other Ambulatory Visit: Payer: Self-pay

## 2018-12-06 ENCOUNTER — Telehealth: Payer: Self-pay

## 2018-12-06 DIAGNOSIS — Z1211 Encounter for screening for malignant neoplasm of colon: Secondary | ICD-10-CM

## 2018-12-06 LAB — CBC WITH DIFFERENTIAL/PLATELET
Absolute Monocytes: 456 cells/uL (ref 200–950)
Basophils Absolute: 68 cells/uL (ref 0–200)
Basophils Relative: 1.7 %
Eosinophils Absolute: 240 cells/uL (ref 15–500)
Eosinophils Relative: 6 %
HCT: 43 % (ref 38.5–50.0)
Hemoglobin: 14.8 g/dL (ref 13.2–17.1)
Lymphs Abs: 1608 cells/uL (ref 850–3900)
MCH: 31.2 pg (ref 27.0–33.0)
MCHC: 34.4 g/dL (ref 32.0–36.0)
MCV: 90.7 fL (ref 80.0–100.0)
MPV: 10.8 fL (ref 7.5–12.5)
Monocytes Relative: 11.4 %
Neutro Abs: 1628 cells/uL (ref 1500–7800)
Neutrophils Relative %: 40.7 %
Platelets: 317 10*3/uL (ref 140–400)
RBC: 4.74 10*6/uL (ref 4.20–5.80)
RDW: 12.7 % (ref 11.0–15.0)
Total Lymphocyte: 40.2 %
WBC: 4 10*3/uL (ref 3.8–10.8)

## 2018-12-06 LAB — LIPID PANEL
Cholesterol: 227 mg/dL — ABNORMAL HIGH (ref <200)
HDL: 63 mg/dL (ref >=40)
LDL Cholesterol (Calc): 144 mg/dL (calc) — ABNORMAL HIGH
Non-HDL Cholesterol (Calc): 164 mg/dL (calc) — ABNORMAL HIGH (ref <130)
Total CHOL/HDL Ratio: 3.6 (calc) (ref <5.0)
Triglycerides: 93 mg/dL (ref <150)

## 2018-12-06 LAB — PSA: PSA: 0.3 ng/mL (ref <=4.0)

## 2018-12-06 LAB — COMPLETE METABOLIC PANEL WITH GFR
AG Ratio: 1.8 (calc) (ref 1.0–2.5)
ALT: 47 U/L — ABNORMAL HIGH (ref 9–46)
AST: 33 U/L (ref 10–35)
Albumin: 5 g/dL (ref 3.6–5.1)
Alkaline phosphatase (APISO): 59 U/L (ref 35–144)
BUN: 11 mg/dL (ref 7–25)
CO2: 29 mmol/L (ref 20–32)
Calcium: 10.1 mg/dL (ref 8.6–10.3)
Chloride: 96 mmol/L — ABNORMAL LOW (ref 98–110)
Creat: 0.8 mg/dL (ref 0.70–1.33)
GFR, Est African American: 119 mL/min/{1.73_m2} (ref >=60)
GFR, Est Non African American: 103 mL/min/{1.73_m2} (ref >=60)
Globulin: 2.8 g/dL (calc) (ref 1.9–3.7)
Glucose, Bld: 105 mg/dL — ABNORMAL HIGH (ref 65–99)
Potassium: 3.8 mmol/L (ref 3.5–5.3)
Sodium: 136 mmol/L (ref 135–146)
Total Bilirubin: 0.5 mg/dL (ref 0.2–1.2)
Total Protein: 7.8 g/dL (ref 6.1–8.1)

## 2018-12-06 LAB — HEPATITIS C ANTIBODY
Hepatitis C Ab: NONREACTIVE
SIGNAL TO CUT-OFF: 0.01 (ref <1.00)

## 2018-12-06 LAB — B12 AND FOLATE PANEL
Folate: >24 ng/mL
Vitamin B-12: 1254 pg/mL — ABNORMAL HIGH (ref 200–1100)

## 2018-12-06 NOTE — Telephone Encounter (Signed)
Gastroenterology Pre-Procedure Review  Request Date: Friday 12/31/18 Requesting Physician: Dr. Allen Norris  PATIENT REVIEW QUESTIONS: The patient responded to the following health history questions as indicated:    1. Are you having any GI issues? no 2. Do you have a personal history of Polyps? no 3. Do you have a family history of Colon Cancer or Polyps? no 4. Diabetes Mellitus? no 5. Joint replacements in the past 12 months?no 6. Major health problems in the past 3 months?yes (11/23/18 hospiatalized with Pneumonia) 7. Any artificial heart valves, MVP, or defibrillator?no    MEDICATIONS & ALLERGIES:    Patient reports the following regarding taking any anticoagulation/antiplatelet therapy:   Plavix, Coumadin, Eliquis, Xarelto, Lovenox, Pradaxa, Brilinta, or Effient? no Aspirin? yes (81 mg daily)  Patient confirms/reports the following medications:  Current Outpatient Medications  Medication Sig Dispense Refill  . amLODipine (NORVASC) 5 MG tablet Take 2 tablets (10 mg total) by mouth daily. 30 tablet 0  . aspirin EC 81 MG tablet Take 81 mg by mouth daily.    . chlorthalidone (HYGROTON) 25 MG tablet Take 1 tablet (25 mg total) by mouth daily. 90 tablet 1   No current facility-administered medications for this visit.     Patient confirms/reports the following allergies:  No Known Allergies  No orders of the defined types were placed in this encounter.   AUTHORIZATION INFORMATION Primary Insurance: 1D#: Group #:  Secondary Insurance: 1D#: Group #:  SCHEDULE INFORMATION: Date: Friday 12/31/18 Time: Location:[facility]

## 2018-12-22 ENCOUNTER — Telehealth: Payer: Self-pay | Admitting: Family Medicine

## 2018-12-22 DIAGNOSIS — I10 Essential (primary) hypertension: Secondary | ICD-10-CM

## 2018-12-22 NOTE — Telephone Encounter (Signed)
Medication Refill - Medication:  amLODipine (NORVASC) 5 MG tablet  Has the patient contacted their pharmacy? Yes advised to call office. Pt unaware if he should continue or not.   Preferred Pharmacy (with phone number or street name):  [id number] [address], [place] [phone] (Phone) [phone] (Fax)   Agent: Please be advised that RX refills may take up to 3 business days. We ask that you follow-up with your pharmacy.

## 2018-12-24 MED ORDER — AMLODIPINE BESYLATE 10 MG PO TABS: 10.0000 mg | ORAL_TABLET | Freq: Every day | ORAL | 0 refills | Status: DC

## 2018-12-24 NOTE — Telephone Encounter (Signed)
I sent #30 of 10mg  Amlodipine.  Please let him know he should never wait until he is completely out to request refills.  He needs to call with 7 day supply left to either the pharmacy or our clinic.

## 2018-12-24 NOTE — Telephone Encounter (Signed)
Pt called in to follow up on refill request. Pt says that he has been without his medication and would like to be sure to get it, today if possible.

## 2018-12-24 NOTE — Telephone Encounter (Signed)
Order sent to Emily for refill 

## 2018-12-27 NOTE — Telephone Encounter (Signed)
Left message for Dadrian to make sure he received his medication

## 2018-12-28 ENCOUNTER — Other Ambulatory Visit: Admission: RE | Admit: 2018-12-28 | Payer: 59 | Source: Ambulatory Visit

## 2018-12-28 ENCOUNTER — Other Ambulatory Visit: Payer: Self-pay

## 2018-12-28 ENCOUNTER — Telehealth: Payer: Self-pay | Admitting: Gastroenterology

## 2018-12-28 DIAGNOSIS — Z1211 Encounter for screening for malignant neoplasm of colon: Secondary | ICD-10-CM

## 2018-12-28 NOTE — Telephone Encounter (Signed)
Returned patients call his colonoscopy has been changed to [facility] on 01/24/19.  New instructions will be sent to patient to reflect date change and location change.  [place] colonoscopy canceled.  Thanks [employer]

## 2018-12-28 NOTE — Telephone Encounter (Signed)
Pt left vm to reschedule his colonoscopy for 12/31/18 please call pt

## 2018-12-30 ENCOUNTER — Encounter: Payer: 59 | Admitting: Family Medicine

## 2018-12-31 ENCOUNTER — Encounter: Admission: RE | Payer: Self-pay | Source: Home / Self Care

## 2018-12-31 ENCOUNTER — Ambulatory Visit: Admission: RE | Admit: 2018-12-31 | Payer: 59 | Source: Home / Self Care | Admitting: Gastroenterology

## 2018-12-31 SURGERY — COLONOSCOPY WITH PROPOFOL
Anesthesia: General

## 2019-01-20 ENCOUNTER — Other Ambulatory Visit: Admission: RE | Admit: 2019-01-20 | Payer: Managed Care, Other (non HMO) | Source: Ambulatory Visit

## 2019-02-07 ENCOUNTER — Encounter: Payer: 59 | Admitting: Family Medicine

## 2019-02-14 ENCOUNTER — Telehealth: Payer: Self-pay

## 2019-02-14 NOTE — Telephone Encounter (Signed)
Called and left a message for call back to rescheduled patient procedure scheduled to 02/28/2019 to a different day due to Dr. Servando Snare not doing procedure on 02/28/2019

## 2019-02-15 ENCOUNTER — Ambulatory Visit: Payer: 59 | Admitting: Family Medicine

## 2019-02-15 ENCOUNTER — Telehealth: Payer: Self-pay

## 2019-02-15 NOTE — Telephone Encounter (Signed)
LVM for pt to call office back to reschedule 02/28/19 colonoscopy due to date is already full.  Will await for him to call back to reschedule.  Thanks [employer]

## 2019-02-24 ENCOUNTER — Other Ambulatory Visit: Admission: RE | Admit: 2019-02-24 | Payer: No Typology Code available for payment source | Source: Ambulatory Visit

## 2019-02-28 ENCOUNTER — Encounter: Admission: RE | Payer: Self-pay | Source: Home / Self Care

## 2019-02-28 ENCOUNTER — Ambulatory Visit
Admission: RE | Admit: 2019-02-28 | Payer: Managed Care, Other (non HMO) | Source: Home / Self Care | Admitting: Gastroenterology

## 2019-02-28 SURGERY — COLONOSCOPY WITH PROPOFOL
Anesthesia: Choice

## 2019-03-05 ENCOUNTER — Ambulatory Visit (INDEPENDENT_AMBULATORY_CARE_PROVIDER_SITE_OTHER)
Admission: RE | Admit: 2019-03-05 | Discharge: 2019-03-05 | Disposition: A | Discharge status: Home / Self Care | Payer: No Typology Code available for payment source | Source: Ambulatory Visit

## 2019-03-05 DIAGNOSIS — L03012 Cellulitis of left finger: Secondary | ICD-10-CM | POA: Diagnosis not present

## 2019-03-05 MED ORDER — DOXYCYCLINE HYCLATE 100 MG PO CAPS: 100.0000 mg | ORAL_CAPSULE | Freq: Two times a day (BID) | ORAL | 0 refills | Status: DC

## 2019-03-05 NOTE — ED Provider Notes (Signed)
Virtual Visit via Video Note:  DANYL DEEMS  initiated request for Telemedicine visit with [facility] team. I connected with [name]  on 03/05/2019 at 11:04 AM  for a synchronized telemedicine visit using a video enabled HIPPA compliant telemedicine application. I verified that I am speaking with [name]  using two identifiers. Georgetta Haber, NP  was physically located in a [facility] Urgent care site and MONTI JILEK was located at a different location.   The limitations of evaluation and management by telemedicine as well as the availability of in-person appointments were discussed. Patient was informed that he  may incur a bill ( including co-pay) for this virtual visit encounter. [name]  expressed understanding and gave verbal consent to proceed with virtual visit.     History of Present Illness:Bobby Williams  is a 54 y.o. male presents with complaints of redness and drainage from left hand pointer finger. On 01/21/19 he accidentally slammed it in a door causing damage to his finger nail. He regularly applied antibiotic ointment, bandage, and soaked in epsom salt. Approximately 1 month after injury the nail fell off and new nail has begun to grow back. Over the past 72 hours he has noted redness and yellow crusting drainage. He is not diabetic. No known MRSA history. Minimal pain. Denies any previous similar.   Past Medical History:  Diagnosis Date  . Anxiety   . Hypertension   . Pneumonia     No Known Allergies      Observations/Objective: Alert, oriented, non toxic in appearance. Clear coherent speech without difficulty. Tip of pointer finger with only minimal new nail growth noted to bed; red to distal end of finger with yellow crusting noted; no tenderness to pad of finger and appears soft. Full ROM of finger   Assessment and Plan: Tip of finger after nail fell off following injury, now with redness and drainage. Yellow crusting. Minimal pain  and no indication of felon. Doxycyline provided today due to concern for infection, with in person precautions provided if any worsening or no improvement in the next week. Patient verbalized understanding and agreeable to plan.    Follow Up Instructions:    I discussed the assessment and treatment plan with the patient. The patient was provided an opportunity to ask questions and all were answered. The patient agreed with the plan and demonstrated an understanding of the instructions.   The patient was advised to call back or seek an in-person evaluation if the symptoms worsen or if the condition fails to improve as anticipated.  I provided 15 minutes of non-face-to-face time during this encounter.    Georgetta Haber, NP  03/05/2019 11:04 AM         Georgetta Haber, NP 03/05/19 [phone]

## 2019-03-05 NOTE — Discharge Instructions (Signed)
It sounds like you are provided appropriate wound care for your finger.  You can continue to soak it in warm water as needed.  Cleanse the area daily with soap and water.  Keep covered as you have been doing.  Complete course of antibiotics.  Please be seen in person in the next week if no improvement or if worsening of redness pain or swelling.  I would expect to notice some improvement within about three days of starting medication with continued improvement over the next 10 days.

## 2019-03-22 ENCOUNTER — Other Ambulatory Visit: Payer: Self-pay | Admitting: Family Medicine

## 2019-06-05 ENCOUNTER — Other Ambulatory Visit: Payer: Self-pay | Admitting: Family Medicine

## 2019-06-05 DIAGNOSIS — I10 Essential (primary) hypertension: Secondary | ICD-10-CM

## 2019-06-05 NOTE — Telephone Encounter (Signed)
Requested Prescriptions  Pending Prescriptions Disp Refills  . chlorthalidone (HYGROTON) 25 MG tablet [Pharmacy Med Name: CHLORTHALIDONE 25 MG TABLET] 90 tablet 0    Sig: TAKE 1 TABLET BY MOUTH EVERY DAY     Cardiovascular: Diuretics - Thiazide Failed - 06/05/2019  9:35 AM      Failed - Last BP in normal range    BP Readings from Last 1 Encounters:  12/03/18 (!) 142/86         Failed - Valid encounter within last 6 months    Recent Outpatient Visits          6 months ago Uncontrolled hypertension   [facility] [place], Gerome Apley, FNP      Future Appointments            In 2 weeks Doren Custard, FNP [facility]           Passed - Ca in normal range and within 360 days    Calcium  Date Value Ref Range Status  12/03/2018 10.1 8.6 - 10.3 mg/dL Final         Passed - Cr in normal range and within 360 days    Creat  Date Value Ref Range Status  12/03/2018 0.80 0.70 - 1.33 mg/dL Final    Comment:    For patients >66 years of age, the reference limit for Creatinine is approximately 13% higher for people identified as African-American. .          Passed - K in normal range and within 360 days    Potassium  Date Value Ref Range Status  12/03/2018 3.8 3.5 - 5.3 mmol/L Final         Passed - Na in normal range and within 360 days    Sodium  Date Value Ref Range Status  12/03/2018 136 135 - 146 mmol/L Final

## 2019-06-20 ENCOUNTER — Encounter: Payer: Self-pay | Admitting: Family Medicine

## 2019-07-27 ENCOUNTER — Other Ambulatory Visit: Payer: Self-pay

## 2019-07-27 MED ORDER — AMLODIPINE BESYLATE 10 MG PO TABS: 10.0000 mg | ORAL_TABLET | Freq: Every day | ORAL | 1 refills | Status: DC

## 2019-07-29 ENCOUNTER — Encounter: Payer: Self-pay | Admitting: Internal Medicine

## 2019-08-31 ENCOUNTER — Other Ambulatory Visit: Payer: Self-pay

## 2019-08-31 DIAGNOSIS — I10 Essential (primary) hypertension: Secondary | ICD-10-CM

## 2019-08-31 MED ORDER — CHLORTHALIDONE 25 MG PO TABS: 25.0000 mg | ORAL_TABLET | Freq: Every day | ORAL | 0 refills | Status: DC

## 2020-07-27 ENCOUNTER — Emergency Department
Admission: EM | Admit: 2020-07-27 | Discharge: 2020-07-27 | Disposition: A | Discharge status: Home / Self Care | Payer: No Typology Code available for payment source | Attending: Emergency Medicine | Admitting: Emergency Medicine

## 2020-07-27 ENCOUNTER — Other Ambulatory Visit: Payer: Self-pay

## 2020-07-27 DIAGNOSIS — I10 Essential (primary) hypertension: Secondary | ICD-10-CM | POA: Diagnosis not present

## 2020-07-27 DIAGNOSIS — Z5321 Procedure and treatment not carried out due to patient leaving prior to being seen by health care provider: Secondary | ICD-10-CM | POA: Diagnosis not present

## 2020-07-27 DIAGNOSIS — R413 Other amnesia: Secondary | ICD-10-CM | POA: Diagnosis present

## 2020-07-27 LAB — CBC
HCT: 44.2 % (ref 39.0–52.0)
Hemoglobin: 15.5 g/dL (ref 13.0–17.0)
MCH: 32.7 pg (ref 26.0–34.0)
MCHC: 35.1 g/dL (ref 30.0–36.0)
MCV: 93.2 fL (ref 80.0–100.0)
Platelets: 149 10*3/uL — ABNORMAL LOW (ref 150–400)
RBC: 4.74 MIL/uL (ref 4.22–5.81)
RDW: 12.3 % (ref 11.5–15.5)
WBC: 4.8 10*3/uL (ref 4.0–10.5)
nRBC: 0 % (ref 0.0–0.2)

## 2020-07-27 LAB — URINALYSIS, COMPLETE (UACMP) WITH MICROSCOPIC
Bacteria, UA: NONE SEEN
Bilirubin Urine: NEGATIVE
Glucose, UA: 50 mg/dL — AB
Hgb urine dipstick: NEGATIVE
Ketones, ur: 5 mg/dL — AB
Leukocytes,Ua: NEGATIVE
Nitrite: NEGATIVE
Protein, ur: 30 mg/dL — AB
Specific Gravity, Urine: 1.026 (ref 1.005–1.030)
Squamous Epithelial / HPF: NONE SEEN (ref 0–5)
pH: 5 (ref 5.0–8.0)

## 2020-07-27 LAB — BASIC METABOLIC PANEL
Anion gap: 6 (ref 5–15)
BUN: 9 mg/dL (ref 6–20)
CO2: 27 mmol/L (ref 22–32)
Calcium: 9.2 mg/dL (ref 8.9–10.3)
Chloride: 107 mmol/L (ref 98–111)
Creatinine, Ser: 0.66 mg/dL (ref 0.61–1.24)
GFR, Estimated: >60 mL/min (ref >60)
Glucose, Bld: 104 mg/dL — ABNORMAL HIGH (ref 70–99)
Potassium: 4.1 mmol/L (ref 3.5–5.1)
Sodium: 140 mmol/L (ref 135–145)

## 2020-07-27 NOTE — ED Triage Notes (Signed)
Pt states he has been out of his HTN meds for the past month and his PCP will not refill them until he is seen, which he says there computers were down and he was not able schedule an appt. Pt also reports period of loss of memory, like forgetting the password for work.Bobby Williams

## 2020-07-27 NOTE — ED Notes (Signed)
Pt has decided to leave and no longer seek treatment.  Pt informed front desk staff and was ambulatory upon leaving the facility.

## 2020-08-16 ENCOUNTER — Ambulatory Visit: Payer: No Typology Code available for payment source | Admitting: Family Medicine

## 2020-08-22 ENCOUNTER — Encounter: Payer: Self-pay | Admitting: Internal Medicine

## 2020-08-22 ENCOUNTER — Ambulatory Visit (INDEPENDENT_AMBULATORY_CARE_PROVIDER_SITE_OTHER): Payer: No Typology Code available for payment source | Admitting: Internal Medicine

## 2020-08-22 ENCOUNTER — Other Ambulatory Visit: Payer: Self-pay

## 2020-08-22 VITALS — BP 248/157 | HR 101 | Ht 76.0 in | Wt 265.7 lb

## 2020-08-22 DIAGNOSIS — F17290 Nicotine dependence, other tobacco product, uncomplicated: Secondary | ICD-10-CM

## 2020-08-22 DIAGNOSIS — F10230 Alcohol dependence with withdrawal, uncomplicated: Secondary | ICD-10-CM

## 2020-08-22 DIAGNOSIS — E6609 Other obesity due to excess calories: Secondary | ICD-10-CM

## 2020-08-22 DIAGNOSIS — Z1159 Encounter for screening for other viral diseases: Secondary | ICD-10-CM | POA: Diagnosis not present

## 2020-08-22 DIAGNOSIS — Z6834 Body mass index (BMI) 34.0-34.9, adult: Secondary | ICD-10-CM

## 2020-08-22 DIAGNOSIS — I10 Essential (primary) hypertension: Secondary | ICD-10-CM | POA: Diagnosis not present

## 2020-08-22 DIAGNOSIS — Z23 Encounter for immunization: Secondary | ICD-10-CM | POA: Diagnosis not present

## 2020-08-22 DIAGNOSIS — E66811 Obesity, class 1: Secondary | ICD-10-CM | POA: Insufficient documentation

## 2020-08-22 DIAGNOSIS — Z125 Encounter for screening for malignant neoplasm of prostate: Secondary | ICD-10-CM

## 2020-08-22 DIAGNOSIS — F10930 Alcohol use, unspecified with withdrawal, uncomplicated: Secondary | ICD-10-CM

## 2020-08-22 DIAGNOSIS — F17200 Nicotine dependence, unspecified, uncomplicated: Secondary | ICD-10-CM | POA: Insufficient documentation

## 2020-08-22 MED ORDER — CHLORTHALIDONE 25 MG PO TABS: 25.0000 mg | ORAL_TABLET | Freq: Every day | ORAL | 0 refills | Status: DC

## 2020-08-22 MED ORDER — AMLODIPINE BESYLATE 10 MG PO TABS: 10.0000 mg | ORAL_TABLET | Freq: Every day | ORAL | 1 refills | Status: DC

## 2020-08-22 NOTE — Progress Notes (Signed)
New Patient Office Visit  Subjective:  Patient ID: Bobby Williams, male    DOB: 29-Oct-1965  Age: 55 y.o. MRN: [id number]  CC:  Chief Complaint  Patient presents with   New Patient (Initial Visit)    Hypertension This is a chronic problem. The current episode started more than 1 year ago. The problem has been waxing and waning since onset. Pertinent negatives include no anxiety, blurred vision, chest pain, headaches, neck pain, orthopnea, palpitations, peripheral edema, PND, shortness of breath or sweats. There are no associated agents to hypertension. Risk factors for coronary artery disease include male gender and obesity. Past treatments include calcium channel blockers, diuretics and lifestyle changes. Compliance problems include exercise and diet.  There is no history of angina, kidney disease, CAD/MI, CVA, heart failure, PVD or retinopathy. There is no history of sleep apnea.  Patient presents for bp check  Past Medical History:  Diagnosis Date   Anxiety    Hypertension    Pneumonia      Current Outpatient Medications:    amLODipine (NORVASC) 10 MG tablet, Take 1 tablet (10 mg total) by mouth daily., Disp: 90 tablet, Rfl: 1   chlorthalidone (HYGROTON) 25 MG tablet, Take 1 tablet (25 mg total) by mouth daily., Disp: 90 tablet, Rfl: 0   Past Surgical History:  Procedure Laterality Date   NO PAST SURGERIES      Family History  Problem Relation Age of Onset   Diabetes Neg Hx     Social History   Socioeconomic History   Marital status: Married    Spouse name: Kiimberly   Number of children: 1   Years of education: Not on file   Highest education level: Not on file  Occupational History   Not on file  Tobacco Use   Smoking status: Never   Smokeless tobacco: Former    Types: Chew    Quit date: 08/27/2016   Tobacco comments:    nicotine Lozenges 4mg  4-6 times  Vaping Use   Vaping Use: Never used  Substance and Sexual Activity   Alcohol use: Yes    Alcohol/week:  28.0 standard drinks    Types: 28 Glasses of wine per week    Comment: 4 glasses per night   Drug use: Never   Sexual activity: Yes    Partners: Female  Other Topics Concern   Not on file  Social History Narrative   Right handed      Highest level of edu-bachelors      Lives with wife and one child   Social Determinants of Health   Financial Resource Strain: Not on file  Food Insecurity: Not on file  Transportation Needs: Not on file  Physical Activity: Not on file  Stress: Not on file  Social Connections: Not on file  Intimate Partner Violence: Not on file    ROS Review of Systems  Constitutional: Negative.  Negative for chills.  HENT: Negative.    Eyes: Negative.  Negative for blurred vision.  Respiratory: Negative.  Negative for choking, chest tightness and shortness of breath.   Cardiovascular:  Negative for chest pain, palpitations, orthopnea and PND.  Endocrine: Negative for polyphagia.  Genitourinary:  Negative for flank pain.  Musculoskeletal:  Negative for back pain, gait problem and neck pain.  Neurological:  Negative for seizures and headaches.  Psychiatric/Behavioral:  Negative for agitation and behavioral problems.    Objective:   Today's Vitals: BP (!) 248/157   Pulse (!) 101   Ht 6'  4" (1.93 m)   Wt 265 lb 11.2 oz (120.5 kg)   BMI 32.34 kg/m   Physical Exam Constitutional:      Appearance: Normal appearance.  HENT:     Head: Normocephalic.     Right Ear: Tympanic membrane normal.     Left Ear: Tympanic membrane normal.     Mouth/Throat:     Mouth: Mucous membranes are moist.  Cardiovascular:     Rate and Rhythm: Normal rate.     Pulses: Normal pulses.  Pulmonary:     Effort: Pulmonary effort is normal.     Breath sounds: No rhonchi.  Abdominal:     General: There is no distension.     Palpations: There is no mass.     Tenderness: There is no guarding.  Musculoskeletal:        General: Normal range of motion.     Cervical back: Normal  range of motion.  Skin:    General: Skin is warm.  Neurological:     General: No focal deficit present.     Mental Status: He is alert.  Psychiatric:        Mood and Affect: Mood normal.        Behavior: Behavior normal.    Assessment & Plan:   Problem List Items Addressed This Visit       Cardiovascular and Mediastinum   Uncontrolled hypertension - Primary     Patient denies any chest pain or shortness of breath there is no history of palpitation or paroxysmal nocturnal dyspnea   patient was advised to follow low-salt low-cholesterol diet    ideally I want to keep systolic blood pressure below 878 mmHg, patient was asked to check blood pressure one times a week and give me a report on that.  Patient will be follow-up in 3 months  or earlier as needed, patient will call me back for any change in the cardiovascular symptoms Patient was advised to buy a book from local bookstore concerning blood pressure and read several chapters  every day.  This will be supplemented by some of the material we will give him from the office.  Patient should also utilize other resources like YouTube and Internet to learn more about the blood pressure and the diet.       Relevant Medications   chlorthalidone (HYGROTON) 25 MG tablet   amLODipine (NORVASC) 10 MG tablet     Nervous and Auditory   Alcohol withdrawal (HCC)    Patient was advised to stop drinking completely         Other   Nicotine dependence    He was advised to cut down on the nicotine gum use       Need for hepatitis C screening test    Lab tests ordered       Prostate cancer screening    Labs ordered       Need for Tdap vaccination   Class 1 obesity due to excess calories with serious comorbidity and body mass index (BMI) of 34.0 to 34.9 in adult    - I encouraged the patient to lose weight.  - I educated them on making healthy dietary choices including eating more fruits and vegetables and less fried foods. - I  encouraged the patient to exercise more, and educated on the benefits of exercise including weight loss, diabetes prevention, and hypertension prevention.   Dietary counseling with a registered dietician  Referral to a weight management support group (e.g. Weight  Watchers, Overeaters Anonymous)  If your BMI is greater than 29 or you have gained more than 15 pounds you should work on weight loss.  Attend a healthy cooking class         Outpatient Encounter Medications as of 08/22/2020  Medication Sig   [DISCONTINUED] amLODipine (NORVASC) 10 MG tablet Take 1 tablet (10 mg total) by mouth daily.   [DISCONTINUED] chlorthalidone (HYGROTON) 25 MG tablet Take 1 tablet (25 mg total) by mouth daily.   amLODipine (NORVASC) 10 MG tablet Take 1 tablet (10 mg total) by mouth daily.   chlorthalidone (HYGROTON) 25 MG tablet Take 1 tablet (25 mg total) by mouth daily.   [DISCONTINUED] aspirin EC 81 MG tablet Take 81 mg by mouth daily. (Patient not taking: Reported on 08/22/2020)   [DISCONTINUED] doxycycline (VIBRAMYCIN) 100 MG capsule Take 1 capsule (100 mg total) by mouth 2 (two) times daily. (Patient not taking: Reported on 08/22/2020)   No facility-administered encounter medications on file as of 08/22/2020.    Follow-up: No follow-ups on file.   Corky Downs, MD

## 2020-08-22 NOTE — Assessment & Plan Note (Signed)

## 2020-08-22 NOTE — Assessment & Plan Note (Signed)
He was advised to cut down on the nicotine gum use

## 2020-08-22 NOTE — Assessment & Plan Note (Signed)
Labs ordered.

## 2020-08-22 NOTE — Assessment & Plan Note (Signed)

## 2020-08-22 NOTE — Assessment & Plan Note (Signed)
Lab tests ordered

## 2020-08-22 NOTE — Assessment & Plan Note (Signed)
Patient was advised to stop drinking completely. 

## 2020-08-27 ENCOUNTER — Other Ambulatory Visit: Payer: Self-pay

## 2020-08-27 ENCOUNTER — Ambulatory Visit (INDEPENDENT_AMBULATORY_CARE_PROVIDER_SITE_OTHER): Payer: No Typology Code available for payment source | Admitting: Internal Medicine

## 2020-08-27 DIAGNOSIS — I10 Essential (primary) hypertension: Secondary | ICD-10-CM | POA: Diagnosis not present

## 2020-08-27 DIAGNOSIS — Z6834 Body mass index (BMI) 34.0-34.9, adult: Secondary | ICD-10-CM

## 2020-08-27 DIAGNOSIS — Z125 Encounter for screening for malignant neoplasm of prostate: Secondary | ICD-10-CM

## 2020-08-27 DIAGNOSIS — E6609 Other obesity due to excess calories: Secondary | ICD-10-CM

## 2020-08-28 LAB — CBC WITH DIFFERENTIAL/PLATELET
Absolute Monocytes: 712 cells/uL (ref 200–950)
Basophils Absolute: 69 cells/uL (ref 0–200)
Basophils Relative: 1.1 %
Eosinophils Absolute: 302 cells/uL (ref 15–500)
Eosinophils Relative: 4.8 %
HCT: 47.6 % (ref 38.5–50.0)
Hemoglobin: 16.7 g/dL (ref 13.2–17.1)
Lymphs Abs: 1638 cells/uL (ref 850–3900)
MCH: 31.7 pg (ref 27.0–33.0)
MCHC: 35.1 g/dL (ref 32.0–36.0)
MCV: 90.5 fL (ref 80.0–100.0)
MPV: 10.6 fL (ref 7.5–12.5)
Monocytes Relative: 11.3 %
Neutro Abs: 3578 cells/uL (ref 1500–7800)
Neutrophils Relative %: 56.8 %
Platelets: 185 10*3/uL (ref 140–400)
RBC: 5.26 10*6/uL (ref 4.20–5.80)
RDW: 13 % (ref 11.0–15.0)
Total Lymphocyte: 26 %
WBC: 6.3 10*3/uL (ref 3.8–10.8)

## 2020-08-28 LAB — LIPID PANEL
Cholesterol: 291 mg/dL — ABNORMAL HIGH (ref <200)
HDL: 78 mg/dL (ref >=40)
LDL Cholesterol (Calc): 194 mg/dL (calc) — ABNORMAL HIGH
Non-HDL Cholesterol (Calc): 213 mg/dL (calc) — ABNORMAL HIGH (ref <130)
Total CHOL/HDL Ratio: 3.7 (calc) (ref <5.0)
Triglycerides: 82 mg/dL (ref <150)

## 2020-08-28 LAB — COMPLETE METABOLIC PANEL WITH GFR
AG Ratio: 1.9 (calc) (ref 1.0–2.5)
ALT: 46 U/L (ref 9–46)
AST: 39 U/L — ABNORMAL HIGH (ref 10–35)
Albumin: 5.2 g/dL — ABNORMAL HIGH (ref 3.6–5.1)
Alkaline phosphatase (APISO): 75 U/L (ref 35–144)
BUN: 10 mg/dL (ref 7–25)
CO2: 28 mmol/L (ref 20–32)
Calcium: 9.4 mg/dL (ref 8.6–10.3)
Chloride: 95 mmol/L — ABNORMAL LOW (ref 98–110)
Creat: 0.8 mg/dL (ref 0.70–1.30)
Globulin: 2.8 g/dL (calc) (ref 1.9–3.7)
Glucose, Bld: 114 mg/dL — ABNORMAL HIGH (ref 65–99)
Potassium: 3.6 mmol/L (ref 3.5–5.3)
Sodium: 138 mmol/L (ref 135–146)
Total Bilirubin: 0.5 mg/dL (ref 0.2–1.2)
Total Protein: 8 g/dL (ref 6.1–8.1)
eGFR: 105 mL/min/{1.73_m2} (ref >=60)

## 2020-08-28 LAB — PSA: PSA: 0.38 ng/mL (ref <=4.00)

## 2020-08-28 LAB — TSH: TSH: 3.98 mIU/L (ref 0.40–4.50)

## 2020-09-04 ENCOUNTER — Ambulatory Visit: Payer: No Typology Code available for payment source | Admitting: Internal Medicine

## 2020-09-17 ENCOUNTER — Ambulatory Visit: Payer: No Typology Code available for payment source | Admitting: Internal Medicine

## 2020-10-18 ENCOUNTER — Emergency Department: Payer: No Typology Code available for payment source

## 2020-10-18 ENCOUNTER — Other Ambulatory Visit: Payer: Self-pay

## 2020-10-18 ENCOUNTER — Emergency Department
Admission: EM | Admit: 2020-10-18 | Discharge: 2020-10-18 | Disposition: A | Discharge status: Home / Self Care | Payer: No Typology Code available for payment source | Attending: Emergency Medicine | Admitting: Emergency Medicine

## 2020-10-18 DIAGNOSIS — F10939 Alcohol use, unspecified with withdrawal, unspecified: Secondary | ICD-10-CM

## 2020-10-18 DIAGNOSIS — Z20822 Contact with and (suspected) exposure to covid-19: Secondary | ICD-10-CM | POA: Insufficient documentation

## 2020-10-18 DIAGNOSIS — R0981 Nasal congestion: Secondary | ICD-10-CM | POA: Diagnosis not present

## 2020-10-18 DIAGNOSIS — Z87891 Personal history of nicotine dependence: Secondary | ICD-10-CM | POA: Diagnosis not present

## 2020-10-18 DIAGNOSIS — F10239 Alcohol dependence with withdrawal, unspecified: Secondary | ICD-10-CM | POA: Insufficient documentation

## 2020-10-18 DIAGNOSIS — I10 Essential (primary) hypertension: Secondary | ICD-10-CM | POA: Insufficient documentation

## 2020-10-18 DIAGNOSIS — R112 Nausea with vomiting, unspecified: Secondary | ICD-10-CM

## 2020-10-18 DIAGNOSIS — Z79899 Other long term (current) drug therapy: Secondary | ICD-10-CM | POA: Diagnosis not present

## 2020-10-18 LAB — CBC
HCT: 46.7 % (ref 39.0–52.0)
Hemoglobin: 16.9 g/dL (ref 13.0–17.0)
MCH: 32.8 pg (ref 26.0–34.0)
MCHC: 36.2 g/dL — ABNORMAL HIGH (ref 30.0–36.0)
MCV: 90.5 fL (ref 80.0–100.0)
Platelets: 68 10*3/uL — ABNORMAL LOW (ref 150–400)
RBC: 5.16 MIL/uL (ref 4.22–5.81)
RDW: 12.1 % (ref 11.5–15.5)
WBC: 7.8 10*3/uL (ref 4.0–10.5)
nRBC: 0 % (ref 0.0–0.2)

## 2020-10-18 LAB — CBG MONITORING, ED: Glucose-Capillary: 120 mg/dL — ABNORMAL HIGH (ref 70–99)

## 2020-10-18 LAB — LIPASE, BLOOD: Lipase: 50 U/L (ref 11–51)

## 2020-10-18 LAB — COMPREHENSIVE METABOLIC PANEL
ALT: 125 U/L — ABNORMAL HIGH (ref 0–44)
AST: 126 U/L — ABNORMAL HIGH (ref 15–41)
Albumin: 4.8 g/dL (ref 3.5–5.0)
Alkaline Phosphatase: 53 U/L (ref 38–126)
Anion gap: 18 — ABNORMAL HIGH (ref 5–15)
BUN: 11 mg/dL (ref 6–20)
CO2: 22 mmol/L (ref 22–32)
Calcium: 9.3 mg/dL (ref 8.9–10.3)
Chloride: 91 mmol/L — ABNORMAL LOW (ref 98–111)
Creatinine, Ser: 0.85 mg/dL (ref 0.61–1.24)
GFR, Estimated: >60 mL/min (ref >60)
Glucose, Bld: 109 mg/dL — ABNORMAL HIGH (ref 70–99)
Potassium: 3.7 mmol/L (ref 3.5–5.1)
Sodium: 131 mmol/L — ABNORMAL LOW (ref 135–145)
Total Bilirubin: 2.3 mg/dL — ABNORMAL HIGH (ref 0.3–1.2)
Total Protein: 8.5 g/dL — ABNORMAL HIGH (ref 6.5–8.1)

## 2020-10-18 LAB — RESP PANEL BY RT-PCR (FLU A&B, COVID) ARPGX2
Influenza A by PCR: NEGATIVE
Influenza B by PCR: NEGATIVE
SARS Coronavirus 2 by RT PCR: NEGATIVE

## 2020-10-18 LAB — MAGNESIUM: Magnesium: 2.3 mg/dL (ref 1.7–2.4)

## 2020-10-18 MED ORDER — ONDANSETRON HCL 4 MG PO TABS: 4.0000 mg | ORAL_TABLET | Freq: Three times a day (TID) | ORAL | 0 refills | Status: DC | PRN

## 2020-10-18 MED ORDER — THIAMINE HCL 100 MG/ML IJ SOLN
100.0000 mg | Freq: Once | INTRAMUSCULAR | Status: AC
  Administered 2020-10-18: 100 mg via INTRAVENOUS

## 2020-10-18 MED ORDER — CHLORDIAZEPOXIDE HCL 25 MG PO CAPS
50.0000 mg | ORAL_CAPSULE | Freq: Once | ORAL | Status: AC
Start: 2020-10-18 — End: 2020-10-18
  Administered 2020-10-18: 50 mg via ORAL
  Filled 2020-10-18: qty 2

## 2020-10-18 MED ORDER — LORAZEPAM 2 MG/ML IJ SOLN: 0.0000 mg | Freq: Four times a day (QID) | INTRAMUSCULAR | Status: DC

## 2020-10-18 MED ORDER — CHLORDIAZEPOXIDE HCL 25 MG PO CAPS: ORAL_CAPSULE | ORAL | 0 refills | Status: AC

## 2020-10-18 MED ORDER — LACTATED RINGERS IV BOLUS
1000.0000 mL | Freq: Once | INTRAVENOUS | Status: DC
  Administered 2020-10-18: 1000 mL via INTRAVENOUS

## 2020-10-18 MED ORDER — SODIUM CHLORIDE 0.9 % IV BOLUS
1000.0000 mL | Freq: Once | INTRAVENOUS | Status: AC
  Administered 2020-10-18: 1000 mL via INTRAVENOUS

## 2020-10-18 MED ORDER — ONDANSETRON 4 MG PO TBDP
4.0000 mg | ORAL_TABLET | Freq: Once | ORAL | Status: AC
  Administered 2020-10-18: 4 mg via ORAL

## 2020-10-18 MED ORDER — ONDANSETRON 4 MG PO TBDP
8.0000 mg | ORAL_TABLET | Freq: Once | ORAL | Status: DC
Start: 2020-10-18 — End: 2020-10-18

## 2020-10-18 MED ORDER — LORAZEPAM 1 MG PO TABS: 0.0000 mg | ORAL_TABLET | Freq: Two times a day (BID) | ORAL | Status: DC

## 2020-10-18 MED ORDER — LORAZEPAM 2 MG/ML IJ SOLN
1.0000 mg | Freq: Once | INTRAMUSCULAR | Status: AC
  Administered 2020-10-18: 1 mg via INTRAVENOUS
  Filled 2020-10-18: qty 1

## 2020-10-18 MED ORDER — LORAZEPAM 1 MG PO TABS: 0.0000 mg | ORAL_TABLET | Freq: Four times a day (QID) | ORAL | Status: DC

## 2020-10-18 MED ORDER — LORAZEPAM 2 MG/ML IJ SOLN: 0.0000 mg | Freq: Two times a day (BID) | INTRAMUSCULAR | Status: DC

## 2020-10-18 MED ORDER — THIAMINE HCL 100 MG PO TABS: 100.0000 mg | ORAL_TABLET | Freq: Every day | ORAL | Status: DC

## 2020-10-18 MED ORDER — THIAMINE HCL 100 MG/ML IJ SOLN: 100.0000 mg | Freq: Every day | INTRAMUSCULAR | Status: DC

## 2020-10-18 MED ORDER — ONDANSETRON HCL 4 MG/2ML IJ SOLN
4.0000 mg | Freq: Once | INTRAMUSCULAR | Status: AC
  Administered 2020-10-18: 4 mg via INTRAVENOUS
  Filled 2020-10-18: qty 2

## 2020-10-18 NOTE — ED Notes (Signed)
Resting better at present  family at bedside   no more vomiting at present

## 2020-10-18 NOTE — ED Provider Notes (Signed)
Emergency Medicine Provider Triage Evaluation Note  Bobby Williams , a 55 y.o. male  was evaluated in triage.  Pt complains of nausea, congestion, gagging, numbness in his toes for several weeks.  Patient denies any headache, visual changes, fevers.  No shortness of breath.  No abdominal pain.  Patient has had some "gagging/spitting up" but no frank emesis.  Patient states that he has not eaten in 4 days.  He is not taking his blood pressure medication due to the nausea.  Denies chest pain, shortness of breath.  No constipation or diarrhea.  No urinary symptoms.  Patient states that he has had numbness in his toes for "long time".  Does not seem to be worsening/changing but patient does mention same today..  Review of Systems  Positive: Nasal congestion, malaise, nausea, numbness in bilateral toes Negative: Chest pain, shortness of breath, abdominal pain, diarrhea, constipation, dysuria, polyuria, hematuria, back pain  Physical Exam  BP (!) 121/98 (BP Location: Right Arm)   Pulse (!) 139   Temp 98.2 F (36.8 C) (Oral)   Resp 18   Ht 6\' 4"  (1.93 m)   Wt 111.1 kg   SpO2 97%   BMI 29.82 kg/m  Gen:   Awake, no distress   Resp:  Normal effort  MSK:   Moves extremities without difficulty.  No significant sensory deficits on cursory exam Other:  Bowel sounds x4 quadrants.  No tenderness to palpation of the abdomen  Medical Decision Making  Medically screening exam initiated at 11:56 AM.  Appropriate orders placed.  DARELLE KINGS was informed that the remainder of the evaluation will be completed by another provider, this initial triage assessment does not replace that evaluation, and the importance of remaining in the ED until their evaluation is complete.  Patient arrived with multiple medical complaints going on for several weeks time.  Patient is nauseated to the point of not taking his blood pressure medication.  Patient denied any pain complaints to include chest pain, abdominal pain, back  pain.  Exam revealed no tenderness.  Patient will have basic labs, EKG, CBG at this time   [name] 10/18/20 1203    10/20/20, MD 10/18/20 1254

## 2020-10-18 NOTE — ED Triage Notes (Signed)
Patient c/o numbness in toes for a few weeks. Reports this past week started having congestion, nausea and vomiting. Denies pain.

## 2020-10-18 NOTE — ED Notes (Signed)
See triage note  presents  with decreased appetite and body aches  states started off with h/a 2 weeks ago  afebrile   conts to have n/v  last time vomited was while in lobby

## 2020-10-18 NOTE — ED Provider Notes (Signed)
[facility] Emergency Department Provider Note  Time seen: 2:53 PM  I have reviewed the triage vital signs and the nursing notes.   HISTORY  Chief Complaint Numbness, Nausea, and Vomiting   HPI Bobby Williams is a 55 y.o. male with a past medical history anxiety, hypertension, alcohol abuse, presents to the emergency department for nausea vomiting, weakness, feeling unwell.  According to the patient since Saturday he has been feeling nauseated with frequent episodes of vomiting.  States he is feeling weak and fatigued also states he is having some congestion.  Patient does admit to drinking approximately 2.5 bottles of wine per day, however since Saturday states he has been drinking 1 to 3 glasses of wine due to the nausea and vomiting and sometimes does not even keep that down.  Here patient appears to be in fairly significant alcohol withdrawal, pulse rate around 140 bpm patient is diaphoretic and quite tremulous throughout examination.  Last alcohol intake was 2 glasses of wine yesterday.  No history of seizures.   Past Medical History:  Diagnosis Date   Anxiety    Hypertension    Pneumonia     Patient Active Problem List   Diagnosis Date Noted   Nicotine dependence 08/22/2020   Need for hepatitis C screening test 08/22/2020   Prostate cancer screening 08/22/2020   Need for Tdap vaccination 08/22/2020   Class 1 obesity due to excess calories with serious comorbidity and body mass index (BMI) of 34.0 to 34.9 in adult 08/22/2020   Anxiety 12/03/2018   Sepsis (HCC) 11/23/2018   Uncontrolled hypertension 11/23/2018   Alcohol withdrawal (HCC) 01/22/2016   Rib fracture 01/22/2016   CAP (community acquired pneumonia) 01/22/2016    Past Surgical History:  Procedure Laterality Date   NO PAST SURGERIES      Prior to Admission medications   Medication Sig Start Date End Date Taking? Authorizing Provider  amLODipine (NORVASC) 10 MG tablet Take 1 tablet (10  mg total) by mouth daily. 08/22/20   Corky Downs, MD  chlorthalidone (HYGROTON) 25 MG tablet Take 1 tablet (25 mg total) by mouth daily. 08/22/20   Corky Downs, MD    No Known Allergies  Family History  Problem Relation Age of Onset   Diabetes Neg Hx     Social History Social History   Tobacco Use   Smoking status: Never   Smokeless tobacco: Former    Types: Chew    Quit date: 08/27/2016   Tobacco comments:    nicotine Lozenges 4mg  4-6 times  Vaping Use   Vaping Use: Never used  Substance Use Topics   Alcohol use: Yes    Alcohol/week: 28.0 standard drinks    Types: 28 Glasses of wine per week    Comment: 4 glasses per night   Drug use: Never    Review of Systems Constitutional: Negative for fever.  Positive generalized weakness/dizziness Cardiovascular: Negative for chest pain. Respiratory: Negative for shortness of breath. Gastrointestinal: Negative for abdominal pain.  Positive nausea vomiting. Genitourinary: Negative for urinary compaints Musculoskeletal: Negative for musculoskeletal complaints Neurological: Negative for headache All other ROS negative  ____________________________________________   PHYSICAL EXAM:  VITAL SIGNS: ED Triage Vitals  Enc Vitals Group     BP 10/18/20 1151 (!) 121/98     Pulse Rate 10/18/20 1151 (!) 139     Resp 10/18/20 1151 18     Temp 10/18/20 1151 98.2 F (36.8 C)     Temp Source 10/18/20 1151 Oral  SpO2 10/18/20 1151 97 %     Weight 10/18/20 1151 245 lb (111.1 kg)     Height 10/18/20 1151 6\' 4"  (1.93 m)     Head Circumference --      Peak Flow --      Pain Score 10/18/20 1152 0     Pain Loc --      Pain Edu? --      Excl. in GC? --    Constitutional: Alert and oriented. Well appearing and in no distress. Eyes: Normal exam ENT      Head: Normocephalic and atraumatic.      Mouth/Throat: Mucous membranes are moist. Cardiovascular: Normal rate, regular rhythm.  Respiratory: Normal respiratory effort without  tachypnea nor retractions. Breath sounds are clear Gastrointestinal: Soft and nontender. No distention.  Musculoskeletal: Nontender with normal range of motion in all extremities.  Neurologic:  Normal speech and language. No gross focal neurologic deficits  Skin:  Skin is warm, dry and intact.  Psychiatric: Mood and affect are normal.   ____________________________________________    EKG  EKG viewed and interpreted by myself shows sinus tachycardia 131 bpm with a narrow QRS, normal axis, normal intervals, no concerning ST changes.  ____________________________________________    RADIOLOGY  Ultrasound pending  ____________________________________________   INITIAL IMPRESSION / ASSESSMENT AND PLAN / ED COURSE  Pertinent labs & imaging results that were available during my care of the patient were reviewed by me and considered in my medical decision making (see chart for details).   Patient presents emergency department for nausea vomiting generalized fatigue weakness dizziness.  Patient is quite tremulous, moderately diaphoretic, tachycardic.  Patient appears to be in a fairly significant amount of alcohol withdrawal.  No history of seizures.  Patient does typically drink 2.5 bottles of wine daily but states for the last 5 days or so that has been significantly less due to the nausea and vomiting.  No fever.  Does state some congestion.  Patient's lab work shows elevated LFTs likely due to alcohol use, no right upper quadrant tenderness however we will obtain an ultrasound as a precaution.  Platelet count appears to be lower than typical as well at 68.  We will dose IV Ativan, Zofran and fluids.  We will dose oral Librium and continue to closely monitor.  We will reassess after medications to see if the patient will require admission versus discharge with Librium taper.  Patient care signed out to oncoming provider.  Bobby Williams was evaluated in Emergency Department on 10/18/2020 for  the symptoms described in the history of present illness. He was evaluated in the context of the global COVID-19 pandemic, which necessitated consideration that the patient might be at risk for infection with the SARS-CoV-2 virus that causes COVID-19. Institutional protocols and algorithms that pertain to the evaluation of patients at risk for COVID-19 are in a state of rapid change based on information released by regulatory bodies including the CDC and federal and state organizations. These policies and algorithms were followed during the patient's care in the ED.  ____________________________________________   FINAL CLINICAL IMPRESSION(S) / ED DIAGNOSES  Alcohol withdrawal   10/20/2020, MD 10/18/20 1457

## 2020-10-18 NOTE — ED Provider Notes (Signed)
I assumed care of this patient approximately 1500.  PCF providers note for full details regarding patient's initial evaluation assessment.  In brief patient presents for assessment of increased nausea and vomiting in the setting of drinking approximately 1-2.5 bottles of wine per day having undergone a last 3 to 4 days due to nausea and vomiting.  Arrival patient tachycardic and slightly dry appearing problem provider.  CMP remarkable for sodium of 131, AST of 126, ALT of 125 and T bili of 2.3 as well as a gap of 18 and a bicarb 22 without any other significant electrode metabolic derangements.  Lipase of 59 consistent with acute pancreatitis.  Plans follow-up right upper quadrant ultrasound and reassess after patient has received IV fluids and some Ativan.  Right upper quadrant ultrasound shows fatty liver infiltration but no gallstones, cholecystitis, dilation of common bile duct or other acute abnormality.  Museum is within normal limits.  On reassessment after fluids Librium and Zofran patient's heart rate is decreased to 108 and the monitor in the room he is able to tolerate some p.o.  He states he is feeling much better.  Suspect some acute gastritis in the setting of fairly significant alcohol abuse and some alcohol withdrawal.  Given he is feeling better with improvement his heart rate and blood pressure think he is stable for discharge close outpatient follow-up.  He is amenable to Librium taper.  Advised to follow-up with [facility] for continued alcohol abuse counseling.  Also advised close outpatient PCP follow-up.  Discharged stable condition.  Strict return precautions advised and discussed.   Gilles Chiquito, MD 10/18/20 [phone]

## 2020-10-29 ENCOUNTER — Other Ambulatory Visit: Payer: Self-pay

## 2020-10-29 ENCOUNTER — Ambulatory Visit (INDEPENDENT_AMBULATORY_CARE_PROVIDER_SITE_OTHER): Payer: No Typology Code available for payment source | Admitting: Internal Medicine

## 2020-10-29 ENCOUNTER — Encounter: Payer: Self-pay | Admitting: Internal Medicine

## 2020-10-29 VITALS — BP 138/93 | HR 101 | Ht 76.0 in | Wt 240.7 lb

## 2020-10-29 DIAGNOSIS — F10930 Alcohol use, unspecified with withdrawal, uncomplicated: Secondary | ICD-10-CM | POA: Diagnosis not present

## 2020-10-29 DIAGNOSIS — Z6834 Body mass index (BMI) 34.0-34.9, adult: Secondary | ICD-10-CM

## 2020-10-29 DIAGNOSIS — Z1159 Encounter for screening for other viral diseases: Secondary | ICD-10-CM | POA: Diagnosis not present

## 2020-10-29 DIAGNOSIS — F17290 Nicotine dependence, other tobacco product, uncomplicated: Secondary | ICD-10-CM

## 2020-10-29 DIAGNOSIS — I10 Essential (primary) hypertension: Secondary | ICD-10-CM | POA: Diagnosis not present

## 2020-10-29 DIAGNOSIS — E6609 Other obesity due to excess calories: Secondary | ICD-10-CM

## 2020-10-29 DIAGNOSIS — J189 Pneumonia, unspecified organism: Secondary | ICD-10-CM

## 2020-10-29 NOTE — Assessment & Plan Note (Signed)

## 2020-10-29 NOTE — Assessment & Plan Note (Signed)
Patient has cut down on drinking alcohol

## 2020-10-29 NOTE — Assessment & Plan Note (Signed)
Resolved

## 2020-10-29 NOTE — Assessment & Plan Note (Signed)
Blood pressure is stable at the present time 

## 2020-10-29 NOTE — Addendum Note (Signed)
Addended by: Melody Comas L on: 10/29/2020 11:58 AM   Modules accepted: Orders

## 2020-10-29 NOTE — Assessment & Plan Note (Signed)
-   I instructed the patient to stop smoking and provided them with smoking cessation materials.  - I informed the patient that smoking puts them at increased risk for cancer, COPD, hypertension, and more.  - Informed the patient to seek help if they begin to have trouble breathing, develop chest pain, start to cough up blood, feel faint, or pass out.  

## 2020-10-29 NOTE — Assessment & Plan Note (Signed)
Negative for hepatitis C.

## 2020-10-29 NOTE — Progress Notes (Signed)
Established Patient Office Visit  Subjective:  Patient ID: Bobby Williams, male    DOB: October 14, 1965  Age: 55 y.o. MRN: [id number]  CC:  Chief Complaint  Patient presents with   Follow-up    Follow up after visit to ED for extreme vomiting and nausea on 10/18/20. Patient states that strength and sleep is starting to return to normal. Patient states melatonin has been helpful.     HPI  Bobby Williams presents for check up  Past Medical History:  Diagnosis Date   Anxiety    Hypertension    Pneumonia     Past Surgical History:  Procedure Laterality Date   NO PAST SURGERIES      Family History  Problem Relation Age of Onset   Diabetes Neg Hx     Social History   Socioeconomic History   Marital status: Married    Spouse name: Kiimberly   Number of children: 1   Years of education: Not on file   Highest education level: Not on file  Occupational History   Not on file  Tobacco Use   Smoking status: Never   Smokeless tobacco: Former    Types: Chew    Quit date: 08/27/2016   Tobacco comments:    nicotine Lozenges $RemoveBeforeDEI'4mg'odxgyDGSyfPDhxct$  4-6 times  Vaping Use   Vaping Use: Never used  Substance and Sexual Activity   Alcohol use: Yes    Alcohol/week: 28.0 standard drinks    Types: 28 Glasses of wine per week    Comment: 4 glasses per night   Drug use: Never   Sexual activity: Yes    Partners: Female  Other Topics Concern   Not on file  Social History Narrative   Right handed      Highest level of edu-bachelors      Lives with wife and one child   Social Determinants of Health   Financial Resource Strain: Not on file  Food Insecurity: Not on file  Transportation Needs: Not on file  Physical Activity: Not on file  Stress: Not on file  Social Connections: Not on file  Intimate Partner Violence: Not on file     Current Outpatient Medications:    amLODipine (NORVASC) 10 MG tablet, Take 1 tablet (10 mg total) by mouth daily., Disp: 90 tablet, Rfl: 1   chlorthalidone  (HYGROTON) 25 MG tablet, Take 1 tablet (25 mg total) by mouth daily., Disp: 90 tablet, Rfl: 0   ondansetron (ZOFRAN) 4 MG tablet, Take 1 tablet (4 mg total) by mouth every 8 (eight) hours as needed for up to 10 doses for nausea or vomiting., Disp: 10 tablet, Rfl: 0   No Known Allergies  ROS Review of Systems  Constitutional: Negative.   HENT: Negative.    Eyes: Negative.   Respiratory: Negative.    Cardiovascular: Negative.   Gastrointestinal: Negative.   Endocrine: Negative.   Genitourinary: Negative.   Musculoskeletal: Negative.   Skin: Negative.   Allergic/Immunologic: Negative.   Neurological: Negative.   Hematological: Negative.   Psychiatric/Behavioral: Negative.    All other systems reviewed and are negative.    Objective:    Physical Exam  BP (!) 138/93   Pulse (!) 101   Ht $R'6\' 4"'da$  (1.93 m)   Wt 240 lb 11.2 oz (109.2 kg)   BMI 29.30 kg/m  Wt Readings from Last 3 Encounters:  10/29/20 240 lb 11.2 oz (109.2 kg)  10/18/20 245 lb (111.1 kg)  08/22/20 265 lb 11.2 oz (120.5 kg)  Health Maintenance Due  Topic Date Due   COLONOSCOPY (Pts 45-106yrs Insurance coverage will need to be confirmed)  Never done   Zoster Vaccines- Shingrix (1 of 2) Never done   COVID-19 Vaccine (3 - Booster for Moderna series) 10/13/2019   INFLUENZA VACCINE  08/27/2020    There are no preventive care reminders to display for this patient.  Lab Results  Component Value Date   TSH 3.98 08/27/2020   Lab Results  Component Value Date   WBC 7.8 10/18/2020   HGB 16.9 10/18/2020   HCT 46.7 10/18/2020   MCV 90.5 10/18/2020   PLT 68 (L) 10/18/2020   Lab Results  Component Value Date   NA 131 (L) 10/18/2020   K 3.7 10/18/2020   CO2 22 10/18/2020   GLUCOSE 109 (H) 10/18/2020   BUN 11 10/18/2020   CREATININE 0.85 10/18/2020   BILITOT 2.3 (H) 10/18/2020   ALKPHOS 53 10/18/2020   AST 126 (H) 10/18/2020   ALT 125 (H) 10/18/2020   PROT 8.5 (H) 10/18/2020   ALBUMIN 4.8 10/18/2020    CALCIUM 9.3 10/18/2020   ANIONGAP 18 (H) 10/18/2020   EGFR 105 08/27/2020   Lab Results  Component Value Date   CHOL 291 (H) 08/27/2020   Lab Results  Component Value Date   HDL 78 08/27/2020   Lab Results  Component Value Date   LDLCALC 194 (H) 08/27/2020   Lab Results  Component Value Date   TRIG 82 08/27/2020   Lab Results  Component Value Date   CHOLHDL 3.7 08/27/2020   No results found for: HGBA1C    Assessment & Plan:   Problem List Items Addressed This Visit       Cardiovascular and Mediastinum   Uncontrolled hypertension    Blood pressure is stable at the present time        Respiratory   CAP (community acquired pneumonia)    Resolved        Nervous and Auditory   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  - I informed the patient that smoking puts them at increased risk for cancer, COPD, hypertension, and more.  - Informed the patient to seek help if they begin to have trouble breathing, develop chest pain, start to cough up blood, feel faint, or pass out.      Need for hepatitis C screening test    Negative for hepatitis C      Class 1 obesity due to excess calories with serious comorbidity and body mass index (BMI) of 34.0 to 34.9 in adult    - I encouraged the patient to lose weight.  - I educated them on making healthy dietary choices including eating more fruits and vegetables and less fried foods. - I encouraged the patient to exercise more, and educated on the benefits of exercise including weight loss, diabetes prevention, and hypertension prevention.   Dietary counseling with a registered dietician  Referral to a weight management support group (e.g. Weight Watchers, Overeaters Anonymous)  If your BMI is greater than 29 or you have gained more than 15 pounds you  should work on weight loss.  Attend a healthy cooking class        No orders of the defined types were placed in this encounter.  Follow-up: No follow-ups on file.    Cletis Athens, MD

## 2020-10-30 LAB — COMPLETE METABOLIC PANEL WITH GFR
AG Ratio: 1.8 (calc) (ref 1.0–2.5)
ALT: 156 U/L — ABNORMAL HIGH (ref 9–46)
AST: 117 U/L — ABNORMAL HIGH (ref 10–35)
Albumin: 4.6 g/dL (ref 3.6–5.1)
Alkaline phosphatase (APISO): 63 U/L (ref 35–144)
BUN/Creatinine Ratio: 8 (calc) (ref 6–22)
BUN: 6 mg/dL — ABNORMAL LOW (ref 7–25)
CO2: 32 mmol/L (ref 20–32)
Calcium: 9.6 mg/dL (ref 8.6–10.3)
Chloride: 87 mmol/L — ABNORMAL LOW (ref 98–110)
Creat: 0.77 mg/dL (ref 0.70–1.30)
Globulin: 2.6 g/dL (calc) (ref 1.9–3.7)
Glucose, Bld: 113 mg/dL — ABNORMAL HIGH (ref 65–99)
Potassium: 2.8 mmol/L — ABNORMAL LOW (ref 3.5–5.3)
Sodium: 132 mmol/L — ABNORMAL LOW (ref 135–146)
Total Bilirubin: 0.7 mg/dL (ref 0.2–1.2)
Total Protein: 7.2 g/dL (ref 6.1–8.1)
eGFR: 106 mL/min/{1.73_m2} (ref >=60)

## 2020-10-30 LAB — CBC WITH DIFFERENTIAL/PLATELET
Absolute Monocytes: 935 cells/uL (ref 200–950)
Basophils Absolute: 120 cells/uL (ref 0–200)
Basophils Relative: 2.1 %
Eosinophils Absolute: 131 cells/uL (ref 15–500)
Eosinophils Relative: 2.3 %
HCT: 46 % (ref 38.5–50.0)
Hemoglobin: 15.8 g/dL (ref 13.2–17.1)
Lymphs Abs: 1288 cells/uL (ref 850–3900)
MCH: 30.8 pg (ref 27.0–33.0)
MCHC: 34.3 g/dL (ref 32.0–36.0)
MCV: 89.7 fL (ref 80.0–100.0)
MPV: 10.8 fL (ref 7.5–12.5)
Monocytes Relative: 16.4 %
Neutro Abs: 3226 cells/uL (ref 1500–7800)
Neutrophils Relative %: 56.6 %
Platelets: 335 10*3/uL (ref 140–400)
RBC: 5.13 10*6/uL (ref 4.20–5.80)
RDW: 12.7 % (ref 11.0–15.0)
Total Lymphocyte: 22.6 %
WBC: 5.7 10*3/uL (ref 3.8–10.8)

## 2020-11-01 ENCOUNTER — Other Ambulatory Visit: Payer: Self-pay

## 2020-11-01 MED ORDER — POTASSIUM CHLORIDE CRYS ER 20 MEQ PO TBCR: 20.0000 meq | EXTENDED_RELEASE_TABLET | Freq: Every day | ORAL | 2 refills | Status: DC

## 2020-11-05 ENCOUNTER — Ambulatory Visit: Payer: No Typology Code available for payment source | Admitting: Internal Medicine

## 2020-11-17 ENCOUNTER — Other Ambulatory Visit: Payer: Self-pay | Admitting: Internal Medicine

## 2020-11-17 DIAGNOSIS — I10 Essential (primary) hypertension: Secondary | ICD-10-CM

## 2020-11-19 ENCOUNTER — Ambulatory Visit: Payer: No Typology Code available for payment source | Admitting: Internal Medicine

## 2021-01-27 DIAGNOSIS — I10 Essential (primary) hypertension: Secondary | ICD-10-CM | POA: Diagnosis not present

## 2021-01-27 DIAGNOSIS — G629 Polyneuropathy, unspecified: Secondary | ICD-10-CM | POA: Diagnosis not present

## 2021-01-27 DIAGNOSIS — F17290 Nicotine dependence, other tobacco product, uncomplicated: Secondary | ICD-10-CM | POA: Diagnosis not present

## 2021-01-27 DIAGNOSIS — R634 Abnormal weight loss: Secondary | ICD-10-CM | POA: Diagnosis not present

## 2021-01-27 DIAGNOSIS — F1020 Alcohol dependence, uncomplicated: Secondary | ICD-10-CM | POA: Diagnosis not present

## 2021-01-27 DIAGNOSIS — F419 Anxiety disorder, unspecified: Secondary | ICD-10-CM | POA: Diagnosis not present

## 2021-01-27 DIAGNOSIS — G4700 Insomnia, unspecified: Secondary | ICD-10-CM | POA: Diagnosis not present

## 2021-01-27 DIAGNOSIS — F329 Major depressive disorder, single episode, unspecified: Secondary | ICD-10-CM | POA: Diagnosis not present

## 2021-01-28 DIAGNOSIS — F1020 Alcohol dependence, uncomplicated: Secondary | ICD-10-CM | POA: Diagnosis not present

## 2021-01-28 DIAGNOSIS — G629 Polyneuropathy, unspecified: Secondary | ICD-10-CM | POA: Diagnosis not present

## 2021-01-28 DIAGNOSIS — R634 Abnormal weight loss: Secondary | ICD-10-CM | POA: Diagnosis not present

## 2021-01-28 DIAGNOSIS — F17290 Nicotine dependence, other tobacco product, uncomplicated: Secondary | ICD-10-CM | POA: Diagnosis not present

## 2021-01-28 DIAGNOSIS — I10 Essential (primary) hypertension: Secondary | ICD-10-CM | POA: Diagnosis not present

## 2021-01-28 DIAGNOSIS — G4700 Insomnia, unspecified: Secondary | ICD-10-CM | POA: Diagnosis not present

## 2021-01-28 DIAGNOSIS — F329 Major depressive disorder, single episode, unspecified: Secondary | ICD-10-CM | POA: Diagnosis not present

## 2021-01-28 DIAGNOSIS — F419 Anxiety disorder, unspecified: Secondary | ICD-10-CM | POA: Diagnosis not present

## 2021-01-29 DIAGNOSIS — G629 Polyneuropathy, unspecified: Secondary | ICD-10-CM | POA: Diagnosis not present

## 2021-01-29 DIAGNOSIS — I10 Essential (primary) hypertension: Secondary | ICD-10-CM | POA: Diagnosis not present

## 2021-01-29 DIAGNOSIS — F1020 Alcohol dependence, uncomplicated: Secondary | ICD-10-CM | POA: Diagnosis not present

## 2021-01-29 DIAGNOSIS — F419 Anxiety disorder, unspecified: Secondary | ICD-10-CM | POA: Diagnosis not present

## 2021-01-29 DIAGNOSIS — R634 Abnormal weight loss: Secondary | ICD-10-CM | POA: Diagnosis not present

## 2021-01-29 DIAGNOSIS — G4700 Insomnia, unspecified: Secondary | ICD-10-CM | POA: Diagnosis not present

## 2021-01-29 DIAGNOSIS — F329 Major depressive disorder, single episode, unspecified: Secondary | ICD-10-CM | POA: Diagnosis not present

## 2021-01-29 DIAGNOSIS — F17290 Nicotine dependence, other tobacco product, uncomplicated: Secondary | ICD-10-CM | POA: Diagnosis not present

## 2021-01-30 DIAGNOSIS — I10 Essential (primary) hypertension: Secondary | ICD-10-CM | POA: Diagnosis not present

## 2021-01-30 DIAGNOSIS — F1020 Alcohol dependence, uncomplicated: Secondary | ICD-10-CM | POA: Diagnosis not present

## 2021-01-30 DIAGNOSIS — G629 Polyneuropathy, unspecified: Secondary | ICD-10-CM | POA: Diagnosis not present

## 2021-01-30 DIAGNOSIS — F17290 Nicotine dependence, other tobacco product, uncomplicated: Secondary | ICD-10-CM | POA: Diagnosis not present

## 2021-01-30 DIAGNOSIS — F329 Major depressive disorder, single episode, unspecified: Secondary | ICD-10-CM | POA: Diagnosis not present

## 2021-01-30 DIAGNOSIS — G4700 Insomnia, unspecified: Secondary | ICD-10-CM | POA: Diagnosis not present

## 2021-01-30 DIAGNOSIS — R634 Abnormal weight loss: Secondary | ICD-10-CM | POA: Diagnosis not present

## 2021-01-30 DIAGNOSIS — F419 Anxiety disorder, unspecified: Secondary | ICD-10-CM | POA: Diagnosis not present

## 2021-01-31 DIAGNOSIS — F329 Major depressive disorder, single episode, unspecified: Secondary | ICD-10-CM | POA: Diagnosis not present

## 2021-01-31 DIAGNOSIS — G4700 Insomnia, unspecified: Secondary | ICD-10-CM | POA: Diagnosis not present

## 2021-01-31 DIAGNOSIS — G629 Polyneuropathy, unspecified: Secondary | ICD-10-CM | POA: Diagnosis not present

## 2021-01-31 DIAGNOSIS — F419 Anxiety disorder, unspecified: Secondary | ICD-10-CM | POA: Diagnosis not present

## 2021-01-31 DIAGNOSIS — I10 Essential (primary) hypertension: Secondary | ICD-10-CM | POA: Diagnosis not present

## 2021-01-31 DIAGNOSIS — F17290 Nicotine dependence, other tobacco product, uncomplicated: Secondary | ICD-10-CM | POA: Diagnosis not present

## 2021-01-31 DIAGNOSIS — F1020 Alcohol dependence, uncomplicated: Secondary | ICD-10-CM | POA: Diagnosis not present

## 2021-01-31 DIAGNOSIS — R634 Abnormal weight loss: Secondary | ICD-10-CM | POA: Diagnosis not present

## 2021-02-01 ENCOUNTER — Other Ambulatory Visit: Payer: Self-pay | Admitting: Internal Medicine

## 2021-02-01 DIAGNOSIS — R634 Abnormal weight loss: Secondary | ICD-10-CM | POA: Diagnosis not present

## 2021-02-01 DIAGNOSIS — I10 Essential (primary) hypertension: Secondary | ICD-10-CM | POA: Diagnosis not present

## 2021-02-01 DIAGNOSIS — G4700 Insomnia, unspecified: Secondary | ICD-10-CM | POA: Diagnosis not present

## 2021-02-01 DIAGNOSIS — F1020 Alcohol dependence, uncomplicated: Secondary | ICD-10-CM | POA: Diagnosis not present

## 2021-02-01 DIAGNOSIS — F17290 Nicotine dependence, other tobacco product, uncomplicated: Secondary | ICD-10-CM | POA: Diagnosis not present

## 2021-02-01 DIAGNOSIS — F419 Anxiety disorder, unspecified: Secondary | ICD-10-CM | POA: Diagnosis not present

## 2021-02-01 DIAGNOSIS — G629 Polyneuropathy, unspecified: Secondary | ICD-10-CM | POA: Diagnosis not present

## 2021-02-01 DIAGNOSIS — F329 Major depressive disorder, single episode, unspecified: Secondary | ICD-10-CM | POA: Diagnosis not present

## 2021-02-02 DIAGNOSIS — I10 Essential (primary) hypertension: Secondary | ICD-10-CM | POA: Diagnosis not present

## 2021-02-02 DIAGNOSIS — F419 Anxiety disorder, unspecified: Secondary | ICD-10-CM | POA: Diagnosis not present

## 2021-02-02 DIAGNOSIS — F1020 Alcohol dependence, uncomplicated: Secondary | ICD-10-CM | POA: Diagnosis not present

## 2021-02-02 DIAGNOSIS — F329 Major depressive disorder, single episode, unspecified: Secondary | ICD-10-CM | POA: Diagnosis not present

## 2021-02-02 DIAGNOSIS — R634 Abnormal weight loss: Secondary | ICD-10-CM | POA: Diagnosis not present

## 2021-02-02 DIAGNOSIS — F17290 Nicotine dependence, other tobacco product, uncomplicated: Secondary | ICD-10-CM | POA: Diagnosis not present

## 2021-02-02 DIAGNOSIS — G4700 Insomnia, unspecified: Secondary | ICD-10-CM | POA: Diagnosis not present

## 2021-02-02 DIAGNOSIS — G629 Polyneuropathy, unspecified: Secondary | ICD-10-CM | POA: Diagnosis not present

## 2021-02-03 DIAGNOSIS — F17290 Nicotine dependence, other tobacco product, uncomplicated: Secondary | ICD-10-CM | POA: Diagnosis not present

## 2021-02-03 DIAGNOSIS — I10 Essential (primary) hypertension: Secondary | ICD-10-CM | POA: Diagnosis not present

## 2021-02-03 DIAGNOSIS — F1020 Alcohol dependence, uncomplicated: Secondary | ICD-10-CM | POA: Diagnosis not present

## 2021-02-03 DIAGNOSIS — F329 Major depressive disorder, single episode, unspecified: Secondary | ICD-10-CM | POA: Diagnosis not present

## 2021-02-03 DIAGNOSIS — R634 Abnormal weight loss: Secondary | ICD-10-CM | POA: Diagnosis not present

## 2021-02-03 DIAGNOSIS — G4700 Insomnia, unspecified: Secondary | ICD-10-CM | POA: Diagnosis not present

## 2021-02-03 DIAGNOSIS — F419 Anxiety disorder, unspecified: Secondary | ICD-10-CM | POA: Diagnosis not present

## 2021-02-03 DIAGNOSIS — G629 Polyneuropathy, unspecified: Secondary | ICD-10-CM | POA: Diagnosis not present

## 2021-02-07 DIAGNOSIS — F419 Anxiety disorder, unspecified: Secondary | ICD-10-CM | POA: Diagnosis not present

## 2021-02-07 DIAGNOSIS — F1020 Alcohol dependence, uncomplicated: Secondary | ICD-10-CM | POA: Diagnosis not present

## 2021-02-07 DIAGNOSIS — R634 Abnormal weight loss: Secondary | ICD-10-CM | POA: Diagnosis not present

## 2021-02-07 DIAGNOSIS — F329 Major depressive disorder, single episode, unspecified: Secondary | ICD-10-CM | POA: Diagnosis not present

## 2021-02-07 DIAGNOSIS — G4700 Insomnia, unspecified: Secondary | ICD-10-CM | POA: Diagnosis not present

## 2021-02-07 DIAGNOSIS — F17290 Nicotine dependence, other tobacco product, uncomplicated: Secondary | ICD-10-CM | POA: Diagnosis not present

## 2021-02-07 DIAGNOSIS — I10 Essential (primary) hypertension: Secondary | ICD-10-CM | POA: Diagnosis not present

## 2021-02-07 DIAGNOSIS — G629 Polyneuropathy, unspecified: Secondary | ICD-10-CM | POA: Diagnosis not present

## 2021-02-11 DIAGNOSIS — F419 Anxiety disorder, unspecified: Secondary | ICD-10-CM | POA: Diagnosis not present

## 2021-02-11 DIAGNOSIS — G629 Polyneuropathy, unspecified: Secondary | ICD-10-CM | POA: Diagnosis not present

## 2021-02-11 DIAGNOSIS — G4700 Insomnia, unspecified: Secondary | ICD-10-CM | POA: Diagnosis not present

## 2021-02-11 DIAGNOSIS — F1020 Alcohol dependence, uncomplicated: Secondary | ICD-10-CM | POA: Diagnosis not present

## 2021-02-11 DIAGNOSIS — F329 Major depressive disorder, single episode, unspecified: Secondary | ICD-10-CM | POA: Diagnosis not present

## 2021-02-11 DIAGNOSIS — I10 Essential (primary) hypertension: Secondary | ICD-10-CM | POA: Diagnosis not present

## 2021-02-11 DIAGNOSIS — F17290 Nicotine dependence, other tobacco product, uncomplicated: Secondary | ICD-10-CM | POA: Diagnosis not present

## 2021-02-11 DIAGNOSIS — R634 Abnormal weight loss: Secondary | ICD-10-CM | POA: Diagnosis not present

## 2021-02-12 DIAGNOSIS — R634 Abnormal weight loss: Secondary | ICD-10-CM | POA: Diagnosis not present

## 2021-02-12 DIAGNOSIS — G4700 Insomnia, unspecified: Secondary | ICD-10-CM | POA: Diagnosis not present

## 2021-02-12 DIAGNOSIS — F329 Major depressive disorder, single episode, unspecified: Secondary | ICD-10-CM | POA: Diagnosis not present

## 2021-02-12 DIAGNOSIS — F419 Anxiety disorder, unspecified: Secondary | ICD-10-CM | POA: Diagnosis not present

## 2021-02-12 DIAGNOSIS — F17290 Nicotine dependence, other tobacco product, uncomplicated: Secondary | ICD-10-CM | POA: Diagnosis not present

## 2021-02-12 DIAGNOSIS — G629 Polyneuropathy, unspecified: Secondary | ICD-10-CM | POA: Diagnosis not present

## 2021-02-12 DIAGNOSIS — I10 Essential (primary) hypertension: Secondary | ICD-10-CM | POA: Diagnosis not present

## 2021-02-12 DIAGNOSIS — F1020 Alcohol dependence, uncomplicated: Secondary | ICD-10-CM | POA: Diagnosis not present

## 2021-02-14 DIAGNOSIS — R634 Abnormal weight loss: Secondary | ICD-10-CM | POA: Diagnosis not present

## 2021-02-14 DIAGNOSIS — G4700 Insomnia, unspecified: Secondary | ICD-10-CM | POA: Diagnosis not present

## 2021-02-14 DIAGNOSIS — I10 Essential (primary) hypertension: Secondary | ICD-10-CM | POA: Diagnosis not present

## 2021-02-14 DIAGNOSIS — F329 Major depressive disorder, single episode, unspecified: Secondary | ICD-10-CM | POA: Diagnosis not present

## 2021-02-14 DIAGNOSIS — G629 Polyneuropathy, unspecified: Secondary | ICD-10-CM | POA: Diagnosis not present

## 2021-02-14 DIAGNOSIS — F17290 Nicotine dependence, other tobacco product, uncomplicated: Secondary | ICD-10-CM | POA: Diagnosis not present

## 2021-02-14 DIAGNOSIS — F1020 Alcohol dependence, uncomplicated: Secondary | ICD-10-CM | POA: Diagnosis not present

## 2021-02-14 DIAGNOSIS — F419 Anxiety disorder, unspecified: Secondary | ICD-10-CM | POA: Diagnosis not present

## 2021-02-18 DIAGNOSIS — F1020 Alcohol dependence, uncomplicated: Secondary | ICD-10-CM | POA: Diagnosis not present

## 2021-02-18 DIAGNOSIS — F329 Major depressive disorder, single episode, unspecified: Secondary | ICD-10-CM | POA: Diagnosis not present

## 2021-02-18 DIAGNOSIS — G629 Polyneuropathy, unspecified: Secondary | ICD-10-CM | POA: Diagnosis not present

## 2021-02-18 DIAGNOSIS — I10 Essential (primary) hypertension: Secondary | ICD-10-CM | POA: Diagnosis not present

## 2021-02-18 DIAGNOSIS — R634 Abnormal weight loss: Secondary | ICD-10-CM | POA: Diagnosis not present

## 2021-02-18 DIAGNOSIS — F17290 Nicotine dependence, other tobacco product, uncomplicated: Secondary | ICD-10-CM | POA: Diagnosis not present

## 2021-02-18 DIAGNOSIS — F419 Anxiety disorder, unspecified: Secondary | ICD-10-CM | POA: Diagnosis not present

## 2021-02-18 DIAGNOSIS — G4700 Insomnia, unspecified: Secondary | ICD-10-CM | POA: Diagnosis not present

## 2021-02-19 DIAGNOSIS — I10 Essential (primary) hypertension: Secondary | ICD-10-CM | POA: Diagnosis not present

## 2021-02-19 DIAGNOSIS — R634 Abnormal weight loss: Secondary | ICD-10-CM | POA: Diagnosis not present

## 2021-02-19 DIAGNOSIS — G4700 Insomnia, unspecified: Secondary | ICD-10-CM | POA: Diagnosis not present

## 2021-02-19 DIAGNOSIS — F1020 Alcohol dependence, uncomplicated: Secondary | ICD-10-CM | POA: Diagnosis not present

## 2021-02-19 DIAGNOSIS — F419 Anxiety disorder, unspecified: Secondary | ICD-10-CM | POA: Diagnosis not present

## 2021-02-19 DIAGNOSIS — F17290 Nicotine dependence, other tobacco product, uncomplicated: Secondary | ICD-10-CM | POA: Diagnosis not present

## 2021-02-19 DIAGNOSIS — F329 Major depressive disorder, single episode, unspecified: Secondary | ICD-10-CM | POA: Diagnosis not present

## 2021-02-19 DIAGNOSIS — G629 Polyneuropathy, unspecified: Secondary | ICD-10-CM | POA: Diagnosis not present

## 2021-02-21 DIAGNOSIS — G4700 Insomnia, unspecified: Secondary | ICD-10-CM | POA: Diagnosis not present

## 2021-02-21 DIAGNOSIS — I10 Essential (primary) hypertension: Secondary | ICD-10-CM | POA: Diagnosis not present

## 2021-02-21 DIAGNOSIS — R634 Abnormal weight loss: Secondary | ICD-10-CM | POA: Diagnosis not present

## 2021-02-21 DIAGNOSIS — F17290 Nicotine dependence, other tobacco product, uncomplicated: Secondary | ICD-10-CM | POA: Diagnosis not present

## 2021-02-21 DIAGNOSIS — F1020 Alcohol dependence, uncomplicated: Secondary | ICD-10-CM | POA: Diagnosis not present

## 2021-02-21 DIAGNOSIS — G629 Polyneuropathy, unspecified: Secondary | ICD-10-CM | POA: Diagnosis not present

## 2021-02-21 DIAGNOSIS — F419 Anxiety disorder, unspecified: Secondary | ICD-10-CM | POA: Diagnosis not present

## 2021-02-21 DIAGNOSIS — F329 Major depressive disorder, single episode, unspecified: Secondary | ICD-10-CM | POA: Diagnosis not present

## 2021-02-25 DIAGNOSIS — I10 Essential (primary) hypertension: Secondary | ICD-10-CM | POA: Diagnosis not present

## 2021-02-25 DIAGNOSIS — R634 Abnormal weight loss: Secondary | ICD-10-CM | POA: Diagnosis not present

## 2021-02-25 DIAGNOSIS — F17290 Nicotine dependence, other tobacco product, uncomplicated: Secondary | ICD-10-CM | POA: Diagnosis not present

## 2021-02-25 DIAGNOSIS — F1020 Alcohol dependence, uncomplicated: Secondary | ICD-10-CM | POA: Diagnosis not present

## 2021-02-25 DIAGNOSIS — F329 Major depressive disorder, single episode, unspecified: Secondary | ICD-10-CM | POA: Diagnosis not present

## 2021-02-25 DIAGNOSIS — G4700 Insomnia, unspecified: Secondary | ICD-10-CM | POA: Diagnosis not present

## 2021-02-25 DIAGNOSIS — G629 Polyneuropathy, unspecified: Secondary | ICD-10-CM | POA: Diagnosis not present

## 2021-02-25 DIAGNOSIS — F419 Anxiety disorder, unspecified: Secondary | ICD-10-CM | POA: Diagnosis not present

## 2021-02-26 DIAGNOSIS — F329 Major depressive disorder, single episode, unspecified: Secondary | ICD-10-CM | POA: Diagnosis not present

## 2021-02-26 DIAGNOSIS — G4700 Insomnia, unspecified: Secondary | ICD-10-CM | POA: Diagnosis not present

## 2021-02-26 DIAGNOSIS — F419 Anxiety disorder, unspecified: Secondary | ICD-10-CM | POA: Diagnosis not present

## 2021-02-26 DIAGNOSIS — G629 Polyneuropathy, unspecified: Secondary | ICD-10-CM | POA: Diagnosis not present

## 2021-02-26 DIAGNOSIS — F1020 Alcohol dependence, uncomplicated: Secondary | ICD-10-CM | POA: Diagnosis not present

## 2021-02-26 DIAGNOSIS — R634 Abnormal weight loss: Secondary | ICD-10-CM | POA: Diagnosis not present

## 2021-02-26 DIAGNOSIS — F17290 Nicotine dependence, other tobacco product, uncomplicated: Secondary | ICD-10-CM | POA: Diagnosis not present

## 2021-02-26 DIAGNOSIS — I10 Essential (primary) hypertension: Secondary | ICD-10-CM | POA: Diagnosis not present

## 2021-02-28 DIAGNOSIS — R634 Abnormal weight loss: Secondary | ICD-10-CM | POA: Diagnosis not present

## 2021-02-28 DIAGNOSIS — G629 Polyneuropathy, unspecified: Secondary | ICD-10-CM | POA: Diagnosis not present

## 2021-02-28 DIAGNOSIS — G4700 Insomnia, unspecified: Secondary | ICD-10-CM | POA: Diagnosis not present

## 2021-02-28 DIAGNOSIS — I10 Essential (primary) hypertension: Secondary | ICD-10-CM | POA: Diagnosis not present

## 2021-02-28 DIAGNOSIS — F1020 Alcohol dependence, uncomplicated: Secondary | ICD-10-CM | POA: Diagnosis not present

## 2021-02-28 DIAGNOSIS — F329 Major depressive disorder, single episode, unspecified: Secondary | ICD-10-CM | POA: Diagnosis not present

## 2021-02-28 DIAGNOSIS — F419 Anxiety disorder, unspecified: Secondary | ICD-10-CM | POA: Diagnosis not present

## 2021-02-28 DIAGNOSIS — F17290 Nicotine dependence, other tobacco product, uncomplicated: Secondary | ICD-10-CM | POA: Diagnosis not present

## 2021-03-04 DIAGNOSIS — R634 Abnormal weight loss: Secondary | ICD-10-CM | POA: Diagnosis not present

## 2021-03-04 DIAGNOSIS — F329 Major depressive disorder, single episode, unspecified: Secondary | ICD-10-CM | POA: Diagnosis not present

## 2021-03-04 DIAGNOSIS — I10 Essential (primary) hypertension: Secondary | ICD-10-CM | POA: Diagnosis not present

## 2021-03-04 DIAGNOSIS — G629 Polyneuropathy, unspecified: Secondary | ICD-10-CM | POA: Diagnosis not present

## 2021-03-04 DIAGNOSIS — F419 Anxiety disorder, unspecified: Secondary | ICD-10-CM | POA: Diagnosis not present

## 2021-03-04 DIAGNOSIS — F1020 Alcohol dependence, uncomplicated: Secondary | ICD-10-CM | POA: Diagnosis not present

## 2021-03-04 DIAGNOSIS — G4700 Insomnia, unspecified: Secondary | ICD-10-CM | POA: Diagnosis not present

## 2021-03-04 DIAGNOSIS — F17290 Nicotine dependence, other tobacco product, uncomplicated: Secondary | ICD-10-CM | POA: Diagnosis not present

## 2021-03-07 DIAGNOSIS — G629 Polyneuropathy, unspecified: Secondary | ICD-10-CM | POA: Diagnosis not present

## 2021-03-07 DIAGNOSIS — F419 Anxiety disorder, unspecified: Secondary | ICD-10-CM | POA: Diagnosis not present

## 2021-03-07 DIAGNOSIS — F329 Major depressive disorder, single episode, unspecified: Secondary | ICD-10-CM | POA: Diagnosis not present

## 2021-03-07 DIAGNOSIS — F17290 Nicotine dependence, other tobacco product, uncomplicated: Secondary | ICD-10-CM | POA: Diagnosis not present

## 2021-03-07 DIAGNOSIS — I10 Essential (primary) hypertension: Secondary | ICD-10-CM | POA: Diagnosis not present

## 2021-03-07 DIAGNOSIS — G4700 Insomnia, unspecified: Secondary | ICD-10-CM | POA: Diagnosis not present

## 2021-03-07 DIAGNOSIS — R634 Abnormal weight loss: Secondary | ICD-10-CM | POA: Diagnosis not present

## 2021-03-07 DIAGNOSIS — F1020 Alcohol dependence, uncomplicated: Secondary | ICD-10-CM | POA: Diagnosis not present

## 2021-03-11 DIAGNOSIS — I10 Essential (primary) hypertension: Secondary | ICD-10-CM | POA: Diagnosis not present

## 2021-03-11 DIAGNOSIS — F17290 Nicotine dependence, other tobacco product, uncomplicated: Secondary | ICD-10-CM | POA: Diagnosis not present

## 2021-03-11 DIAGNOSIS — F419 Anxiety disorder, unspecified: Secondary | ICD-10-CM | POA: Diagnosis not present

## 2021-03-11 DIAGNOSIS — R634 Abnormal weight loss: Secondary | ICD-10-CM | POA: Diagnosis not present

## 2021-03-11 DIAGNOSIS — G4700 Insomnia, unspecified: Secondary | ICD-10-CM | POA: Diagnosis not present

## 2021-03-11 DIAGNOSIS — F1020 Alcohol dependence, uncomplicated: Secondary | ICD-10-CM | POA: Diagnosis not present

## 2021-03-11 DIAGNOSIS — F329 Major depressive disorder, single episode, unspecified: Secondary | ICD-10-CM | POA: Diagnosis not present

## 2021-03-11 DIAGNOSIS — G629 Polyneuropathy, unspecified: Secondary | ICD-10-CM | POA: Diagnosis not present

## 2021-03-14 DIAGNOSIS — G629 Polyneuropathy, unspecified: Secondary | ICD-10-CM | POA: Diagnosis not present

## 2021-03-14 DIAGNOSIS — F419 Anxiety disorder, unspecified: Secondary | ICD-10-CM | POA: Diagnosis not present

## 2021-03-14 DIAGNOSIS — G4700 Insomnia, unspecified: Secondary | ICD-10-CM | POA: Diagnosis not present

## 2021-03-14 DIAGNOSIS — I10 Essential (primary) hypertension: Secondary | ICD-10-CM | POA: Diagnosis not present

## 2021-03-14 DIAGNOSIS — F17290 Nicotine dependence, other tobacco product, uncomplicated: Secondary | ICD-10-CM | POA: Diagnosis not present

## 2021-03-14 DIAGNOSIS — R634 Abnormal weight loss: Secondary | ICD-10-CM | POA: Diagnosis not present

## 2021-03-14 DIAGNOSIS — F329 Major depressive disorder, single episode, unspecified: Secondary | ICD-10-CM | POA: Diagnosis not present

## 2021-03-14 DIAGNOSIS — F1020 Alcohol dependence, uncomplicated: Secondary | ICD-10-CM | POA: Diagnosis not present

## 2021-03-18 DIAGNOSIS — F17290 Nicotine dependence, other tobacco product, uncomplicated: Secondary | ICD-10-CM | POA: Diagnosis not present

## 2021-03-18 DIAGNOSIS — G629 Polyneuropathy, unspecified: Secondary | ICD-10-CM | POA: Diagnosis not present

## 2021-03-18 DIAGNOSIS — G4700 Insomnia, unspecified: Secondary | ICD-10-CM | POA: Diagnosis not present

## 2021-03-18 DIAGNOSIS — F419 Anxiety disorder, unspecified: Secondary | ICD-10-CM | POA: Diagnosis not present

## 2021-03-18 DIAGNOSIS — F329 Major depressive disorder, single episode, unspecified: Secondary | ICD-10-CM | POA: Diagnosis not present

## 2021-03-18 DIAGNOSIS — I10 Essential (primary) hypertension: Secondary | ICD-10-CM | POA: Diagnosis not present

## 2021-03-18 DIAGNOSIS — F1020 Alcohol dependence, uncomplicated: Secondary | ICD-10-CM | POA: Diagnosis not present

## 2021-03-18 DIAGNOSIS — R634 Abnormal weight loss: Secondary | ICD-10-CM | POA: Diagnosis not present

## 2021-03-21 DIAGNOSIS — F419 Anxiety disorder, unspecified: Secondary | ICD-10-CM | POA: Diagnosis not present

## 2021-03-21 DIAGNOSIS — F17290 Nicotine dependence, other tobacco product, uncomplicated: Secondary | ICD-10-CM | POA: Diagnosis not present

## 2021-03-21 DIAGNOSIS — G629 Polyneuropathy, unspecified: Secondary | ICD-10-CM | POA: Diagnosis not present

## 2021-03-21 DIAGNOSIS — R634 Abnormal weight loss: Secondary | ICD-10-CM | POA: Diagnosis not present

## 2021-03-21 DIAGNOSIS — F329 Major depressive disorder, single episode, unspecified: Secondary | ICD-10-CM | POA: Diagnosis not present

## 2021-03-21 DIAGNOSIS — G4700 Insomnia, unspecified: Secondary | ICD-10-CM | POA: Diagnosis not present

## 2021-03-21 DIAGNOSIS — F1020 Alcohol dependence, uncomplicated: Secondary | ICD-10-CM | POA: Diagnosis not present

## 2021-03-21 DIAGNOSIS — I10 Essential (primary) hypertension: Secondary | ICD-10-CM | POA: Diagnosis not present

## 2021-03-25 DIAGNOSIS — F1020 Alcohol dependence, uncomplicated: Secondary | ICD-10-CM | POA: Diagnosis not present

## 2021-03-25 DIAGNOSIS — F17290 Nicotine dependence, other tobacco product, uncomplicated: Secondary | ICD-10-CM | POA: Diagnosis not present

## 2021-03-25 DIAGNOSIS — R634 Abnormal weight loss: Secondary | ICD-10-CM | POA: Diagnosis not present

## 2021-03-25 DIAGNOSIS — G4700 Insomnia, unspecified: Secondary | ICD-10-CM | POA: Diagnosis not present

## 2021-03-25 DIAGNOSIS — G629 Polyneuropathy, unspecified: Secondary | ICD-10-CM | POA: Diagnosis not present

## 2021-03-25 DIAGNOSIS — F329 Major depressive disorder, single episode, unspecified: Secondary | ICD-10-CM | POA: Diagnosis not present

## 2021-03-25 DIAGNOSIS — F419 Anxiety disorder, unspecified: Secondary | ICD-10-CM | POA: Diagnosis not present

## 2021-03-25 DIAGNOSIS — I10 Essential (primary) hypertension: Secondary | ICD-10-CM | POA: Diagnosis not present

## 2021-03-26 DIAGNOSIS — F17290 Nicotine dependence, other tobacco product, uncomplicated: Secondary | ICD-10-CM | POA: Diagnosis not present

## 2021-03-26 DIAGNOSIS — G4700 Insomnia, unspecified: Secondary | ICD-10-CM | POA: Diagnosis not present

## 2021-03-26 DIAGNOSIS — G629 Polyneuropathy, unspecified: Secondary | ICD-10-CM | POA: Diagnosis not present

## 2021-03-26 DIAGNOSIS — R634 Abnormal weight loss: Secondary | ICD-10-CM | POA: Diagnosis not present

## 2021-03-26 DIAGNOSIS — F1020 Alcohol dependence, uncomplicated: Secondary | ICD-10-CM | POA: Diagnosis not present

## 2021-03-26 DIAGNOSIS — F419 Anxiety disorder, unspecified: Secondary | ICD-10-CM | POA: Diagnosis not present

## 2021-03-26 DIAGNOSIS — F329 Major depressive disorder, single episode, unspecified: Secondary | ICD-10-CM | POA: Diagnosis not present

## 2021-03-26 DIAGNOSIS — I10 Essential (primary) hypertension: Secondary | ICD-10-CM | POA: Diagnosis not present

## 2021-03-28 DIAGNOSIS — G629 Polyneuropathy, unspecified: Secondary | ICD-10-CM | POA: Diagnosis not present

## 2021-03-28 DIAGNOSIS — F17290 Nicotine dependence, other tobacco product, uncomplicated: Secondary | ICD-10-CM | POA: Diagnosis not present

## 2021-03-28 DIAGNOSIS — F329 Major depressive disorder, single episode, unspecified: Secondary | ICD-10-CM | POA: Diagnosis not present

## 2021-03-28 DIAGNOSIS — F419 Anxiety disorder, unspecified: Secondary | ICD-10-CM | POA: Diagnosis not present

## 2021-03-28 DIAGNOSIS — R634 Abnormal weight loss: Secondary | ICD-10-CM | POA: Diagnosis not present

## 2021-03-28 DIAGNOSIS — I10 Essential (primary) hypertension: Secondary | ICD-10-CM | POA: Diagnosis not present

## 2021-03-28 DIAGNOSIS — G4700 Insomnia, unspecified: Secondary | ICD-10-CM | POA: Diagnosis not present

## 2021-03-28 DIAGNOSIS — F1020 Alcohol dependence, uncomplicated: Secondary | ICD-10-CM | POA: Diagnosis not present

## 2021-04-01 DIAGNOSIS — G629 Polyneuropathy, unspecified: Secondary | ICD-10-CM | POA: Diagnosis not present

## 2021-04-01 DIAGNOSIS — G4700 Insomnia, unspecified: Secondary | ICD-10-CM | POA: Diagnosis not present

## 2021-04-01 DIAGNOSIS — R634 Abnormal weight loss: Secondary | ICD-10-CM | POA: Diagnosis not present

## 2021-04-01 DIAGNOSIS — I10 Essential (primary) hypertension: Secondary | ICD-10-CM | POA: Diagnosis not present

## 2021-04-01 DIAGNOSIS — F419 Anxiety disorder, unspecified: Secondary | ICD-10-CM | POA: Diagnosis not present

## 2021-04-01 DIAGNOSIS — F1020 Alcohol dependence, uncomplicated: Secondary | ICD-10-CM | POA: Diagnosis not present

## 2021-04-01 DIAGNOSIS — F17290 Nicotine dependence, other tobacco product, uncomplicated: Secondary | ICD-10-CM | POA: Diagnosis not present

## 2021-04-01 DIAGNOSIS — F329 Major depressive disorder, single episode, unspecified: Secondary | ICD-10-CM | POA: Diagnosis not present

## 2021-04-02 DIAGNOSIS — F1020 Alcohol dependence, uncomplicated: Secondary | ICD-10-CM | POA: Diagnosis not present

## 2021-04-02 DIAGNOSIS — F419 Anxiety disorder, unspecified: Secondary | ICD-10-CM | POA: Diagnosis not present

## 2021-04-02 DIAGNOSIS — R634 Abnormal weight loss: Secondary | ICD-10-CM | POA: Diagnosis not present

## 2021-04-02 DIAGNOSIS — I10 Essential (primary) hypertension: Secondary | ICD-10-CM | POA: Diagnosis not present

## 2021-04-02 DIAGNOSIS — F17290 Nicotine dependence, other tobacco product, uncomplicated: Secondary | ICD-10-CM | POA: Diagnosis not present

## 2021-04-02 DIAGNOSIS — G4700 Insomnia, unspecified: Secondary | ICD-10-CM | POA: Diagnosis not present

## 2021-04-02 DIAGNOSIS — G629 Polyneuropathy, unspecified: Secondary | ICD-10-CM | POA: Diagnosis not present

## 2021-04-02 DIAGNOSIS — F329 Major depressive disorder, single episode, unspecified: Secondary | ICD-10-CM | POA: Diagnosis not present

## 2021-05-02 ENCOUNTER — Other Ambulatory Visit: Payer: Self-pay | Admitting: Internal Medicine

## 2021-05-02 DIAGNOSIS — I10 Essential (primary) hypertension: Secondary | ICD-10-CM

## 2021-05-03 ENCOUNTER — Ambulatory Visit: Payer: No Typology Code available for payment source

## 2021-05-03 DIAGNOSIS — Z008 Encounter for other general examination: Secondary | ICD-10-CM | POA: Diagnosis not present

## 2021-05-07 ENCOUNTER — Ambulatory Visit: Payer: BLUE CROSS/BLUE SHIELD | Admitting: Internal Medicine

## 2021-05-07 ENCOUNTER — Encounter: Payer: Self-pay | Admitting: Internal Medicine

## 2021-05-07 VITALS — BP 140/84 | HR 82 | Ht 76.0 in | Wt 208.0 lb

## 2021-05-07 DIAGNOSIS — E6609 Other obesity due to excess calories: Secondary | ICD-10-CM

## 2021-05-07 DIAGNOSIS — F10930 Alcohol use, unspecified with withdrawal, uncomplicated: Secondary | ICD-10-CM | POA: Diagnosis not present

## 2021-05-07 DIAGNOSIS — I10 Essential (primary) hypertension: Secondary | ICD-10-CM

## 2021-05-07 DIAGNOSIS — F419 Anxiety disorder, unspecified: Secondary | ICD-10-CM

## 2021-05-07 DIAGNOSIS — Z6834 Body mass index (BMI) 34.0-34.9, adult: Secondary | ICD-10-CM

## 2021-05-07 DIAGNOSIS — E66811 Obesity, class 1: Secondary | ICD-10-CM

## 2021-05-07 MED ORDER — TRAZODONE HCL 100 MG PO TABS: 100.0000 mg | ORAL_TABLET | Freq: Every day | ORAL | 6 refills | Status: DC

## 2021-05-07 MED ORDER — NALTREXONE HCL 50 MG PO TABS: 50.0000 mg | ORAL_TABLET | Freq: Every day | ORAL | 6 refills | Status: DC

## 2021-05-07 NOTE — Assessment & Plan Note (Signed)
Patient does not have any withdrawal symptoms right now, he stopped drinking completely ?

## 2021-05-07 NOTE — Assessment & Plan Note (Signed)
Under control 

## 2021-05-07 NOTE — Assessment & Plan Note (Signed)
Patient has lost 60 pounds ?

## 2021-05-07 NOTE — Progress Notes (Signed)
? ?Established Patient Office Visit ? ?Subjective:  ?Patient ID: ABDULHAMID OLGIN, male    DOB: 03-12-1965  Age: 56 y.o. MRN: [id number] ? ?CC:  ?Chief Complaint  ?Patient presents with  ? Follow-up  ? ? ?HPI ? ?Donato Heinz presents for reg  check ,pt stopped drinking ? ?Past Medical History:  ?Diagnosis Date  ? Anxiety   ? Hypertension   ? Pneumonia   ? ? ?Past Surgical History:  ?Procedure Laterality Date  ? NO PAST SURGERIES    ? ? ?Family History  ?Problem Relation Age of Onset  ? Diabetes Neg Hx   ? ? ?Social History  ? ?Socioeconomic History  ? Marital status: Married  ?  Spouse name: Nani Gasser  ? Number of children: 1  ? Years of education: Not on file  ? Highest education level: Not on file  ?Occupational History  ? Not on file  ?Tobacco Use  ? Smoking status: Never  ? Smokeless tobacco: Former  ?  Types: Chew  ?  Quit date: 08/27/2016  ? Tobacco comments:  ?  nicotine Lozenges 74m 4-6 times  ?Vaping Use  ? Vaping Use: Never used  ?Substance and Sexual Activity  ? Alcohol use: Yes  ?  Alcohol/week: 28.0 standard drinks  ?  Types: 28 Glasses of wine per week  ?  Comment: 4 glasses per night  ? Drug use: Never  ? Sexual activity: Yes  ?  Partners: Female  ?Other Topics Concern  ? Not on file  ?Social History Narrative  ? Right handed  ?   ? Highest level of edu-bachelors  ?   ? Lives with wife and one child  ? ?Social Determinants of Health  ? ?Financial Resource Strain: Not on file  ?Food Insecurity: Not on file  ?Transportation Needs: Not on file  ?Physical Activity: Not on file  ?Stress: Not on file  ?Social Connections: Not on file  ?Intimate Partner Violence: Not on file  ? ? ? ?Current Outpatient Medications:  ?  amLODipine (NORVASC) 10 MG tablet, TAKE 1 TABLET BY MOUTH EVERY DAY, Disp: 90 tablet, Rfl: 1 ?  chlorthalidone (HYGROTON) 25 MG tablet, TAKE 1 TABLET (25 MG TOTAL) BY MOUTH DAILY., Disp: 90 tablet, Rfl: 0 ?  ondansetron (ZOFRAN) 4 MG tablet, Take 1 tablet (4 mg total) by mouth every 8 (eight)  hours as needed for up to 10 doses for nausea or vomiting., Disp: 10 tablet, Rfl: 0 ?  potassium chloride SA (KLOR-CON) 20 MEQ tablet, Take 1 tablet (20 mEq total) by mouth daily., Disp: 30 tablet, Rfl: 2 ?  sertraline (ZOLOFT) 50 MG tablet, Take 50 mg by mouth daily., Disp: , Rfl:  ?  naltrexone (DEPADE) 50 MG tablet, Take 1 tablet (50 mg total) by mouth at bedtime., Disp: 30 tablet, Rfl: 6 ?  traZODone (DESYREL) 100 MG tablet, Take 1 tablet (100 mg total) by mouth at bedtime., Disp: 30 tablet, Rfl: 6  ? ?No Known Allergies ? ?ROS ?Review of Systems  ?Constitutional: Negative.   ?HENT: Negative.    ?Eyes: Negative.   ?Respiratory: Negative.    ?Cardiovascular: Negative.   ?Gastrointestinal: Negative.   ?Endocrine: Negative.   ?Genitourinary: Negative.   ?Musculoskeletal: Negative.   ?Skin: Negative.   ?Allergic/Immunologic: Negative.   ?Neurological: Negative.   ?Hematological: Negative.   ?Psychiatric/Behavioral: Negative.    ?All other systems reviewed and are negative. ? ?  ?Objective:  ?  ?Physical Exam ?Vitals reviewed.  ?Constitutional:   ?  Appearance: Normal appearance.  ?HENT:  ?   Mouth/Throat:  ?   Mouth: Mucous membranes are moist.  ?Eyes:  ?   Pupils: Pupils are equal, round, and reactive to light.  ?Neck:  ?   Vascular: No carotid bruit.  ?Cardiovascular:  ?   Rate and Rhythm: Normal rate and regular rhythm.  ?   Pulses: Normal pulses.  ?   Heart sounds: Normal heart sounds.  ?Pulmonary:  ?   Effort: Pulmonary effort is normal.  ?   Breath sounds: Normal breath sounds.  ?Abdominal:  ?   General: Bowel sounds are normal.  ?   Palpations: Abdomen is soft. There is no hepatomegaly, splenomegaly or mass.  ?   Tenderness: There is no abdominal tenderness.  ?   Hernia: No hernia is present.  ?Musculoskeletal:  ?   Cervical back: Neck supple.  ?   Right lower leg: No edema.  ?   Left lower leg: No edema.  ?Skin: ?   Findings: No rash.  ?Neurological:  ?   Mental Status: He is alert and oriented to person,  place, and time.  ?   Motor: No weakness.  ?Psychiatric:     ?   Mood and Affect: Mood normal.     ?   Behavior: Behavior normal.  ? ? ?BP 140/84   Pulse 82   Ht _0  (1.93 m)   Wt 208 lb (94.3 kg)   BMI 25.32 kg/m?  ?Wt Readings from Last 3 Encounters:  ?05/07/21 208 lb (94.3 kg)  ?10/29/20 240 lb 11.2 oz (109.2 kg)  ?10/18/20 245 lb (111.1 kg)  ? ? ? ?Health Maintenance Due  ?Topic Date Due  ? COLONOSCOPY (Pts 45-72yr Insurance coverage will need to be confirmed)  Never done  ? Zoster Vaccines- Shingrix (1 of 2) Never done  ? COVID-19 Vaccine (3 - Booster for Moderna series) 07/08/2019  ? ? ?There are no preventive care reminders to display for this patient. ? ?Lab Results  ?Component Value Date  ? TSH 3.98 08/27/2020  ? ?Lab Results  ?Component Value Date  ? WBC 5.7 10/29/2020  ? HGB 15.8 10/29/2020  ? HCT 46.0 10/29/2020  ? MCV 89.7 10/29/2020  ? PLT 335 10/29/2020  ? ?Lab Results  ?Component Value Date  ? NA 132 (L) 10/29/2020  ? K 2.8 (L) 10/29/2020  ? CO2 32 10/29/2020  ? GLUCOSE 113 (H) 10/29/2020  ? BUN 6 (L) 10/29/2020  ? CREATININE 0.77 10/29/2020  ? BILITOT 0.7 10/29/2020  ? ALKPHOS 53 10/18/2020  ? AST 117 (H) 10/29/2020  ? ALT 156 (H) 10/29/2020  ? PROT 7.2 10/29/2020  ? ALBUMIN 4.8 10/18/2020  ? CALCIUM 9.6 10/29/2020  ? ANIONGAP 18 (H) 10/18/2020  ? EGFR 106 10/29/2020  ? ?Lab Results  ?Component Value Date  ? CHOL 291 (H) 08/27/2020  ? ?Lab Results  ?Component Value Date  ? HDL 78 08/27/2020  ? ?Lab Results  ?Component Value Date  ? LDLCALC 194 (H) 08/27/2020  ? ?Lab Results  ?Component Value Date  ? TRIG 82 08/27/2020  ? ?Lab Results  ?Component Value Date  ? CHOLHDL 3.7 08/27/2020  ? ?No results found for: HGBA1C ? ?  ?Assessment & Plan:  ? ?Problem List Items Addressed This Visit   ? ?  ? Cardiovascular and Mediastinum  ? Uncontrolled hypertension - Primary  ?   Patient denies any chest pain or shortness of breath there is no history of palpitation or paroxysmal  nocturnal dyspnea ?  patient  was advised to follow low-salt low-cholesterol diet ? ?  ideally I want to keep systolic blood pressure below 130 mmHg, patient was asked to check blood pressure one times a week and give me a report on that.  Patient will be follow-up in 3 months  or earlier as needed, patient will call me back for any change in the cardiovascular symptoms ?Patient was advised to buy a book from local bookstore concerning blood pressure and read several chapters  every day.  This will be supplemented by some of the material we will give him from the office.  Patient should also utilize other resources like YouTube and Internet to learn more about the blood pressure and the diet. ?  ?  ?  ? Other  ? Alcohol withdrawal ([place])  ?  Patient does not have any withdrawal symptoms right now, he stopped drinking completely ?  ?  ? Anxiety  ?  Under control ?  ?  ? Relevant Medications  ? sertraline (ZOLOFT) 50 MG tablet  ? traZODone (DESYREL) 100 MG tablet  ? Class 1 obesity due to excess calories with serious comorbidity and body mass index (BMI) of 34.0 to 34.9 in adult  ?  Patient has lost 60 pounds ?  ?  ? ? ?Meds ordered this encounter  ?Medications  ? naltrexone (DEPADE) 50 MG tablet  ?  Sig: Take 1 tablet (50 mg total) by mouth at bedtime.  ?  Dispense:  30 tablet  ?  Refill:  6  ? traZODone (DESYREL) 100 MG tablet  ?  Sig: Take 1 tablet (100 mg total) by mouth at bedtime.  ?  Dispense:  30 tablet  ?  Refill:  6  ? ? ?Follow-up: No follow-ups on file.  ? ? ?Cletis Athens, MD ?

## 2021-05-07 NOTE — Assessment & Plan Note (Signed)

## 2021-05-21 ENCOUNTER — Other Ambulatory Visit: Payer: Self-pay | Admitting: *Deleted

## 2021-05-21 MED ORDER — SERTRALINE HCL 50 MG PO TABS: 50.0000 mg | ORAL_TABLET | Freq: Every day | ORAL | 4 refills | Status: DC

## 2021-06-15 ENCOUNTER — Other Ambulatory Visit: Payer: Self-pay | Admitting: Internal Medicine

## 2021-06-16 IMAGING — MR MR HEAD W/O CM
13 series · 42 of 48 positions shown · non-contrast
Comparison: Head CT 11/23/2018

CLINICAL DATA: Encephalopathy. Alcohol withdrawal.

EXAM:
MRI HEAD WITHOUT CONTRAST
TECHNIQUE: Multiplanar, multiecho pulse sequences of the brain and surrounding
structures were obtained without intravenous contrast.

[Series 5: ax dwi_tracew · axial · 3.0mm · 0.60mm/px · z∈[-86,+75]mm · 2 of 50 slices shown (1 of 2)]
[im 1/50]
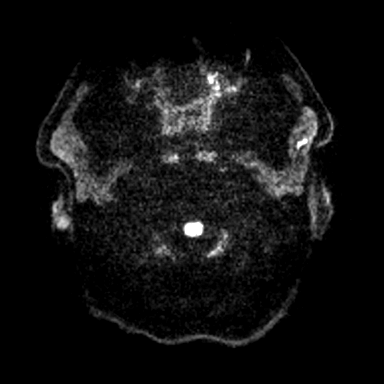
[im 50/50]
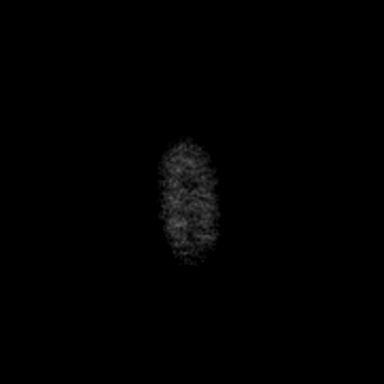

[Series 5: ax dwi_tracew · axial · 3.0mm · 0.60mm/px · z∈[-86,+75]mm · 3 of 50 slices shown (2 of 2)]
[im 1/50]
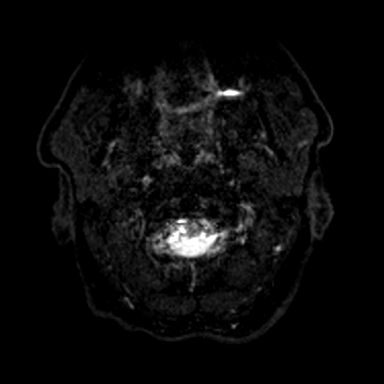
[im 25/50]
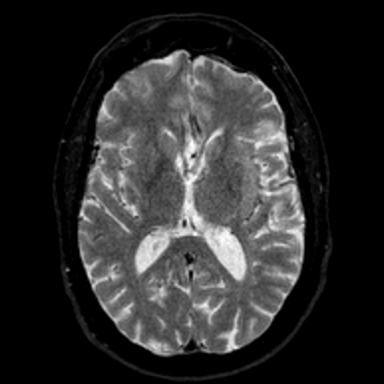
[im 50/50]
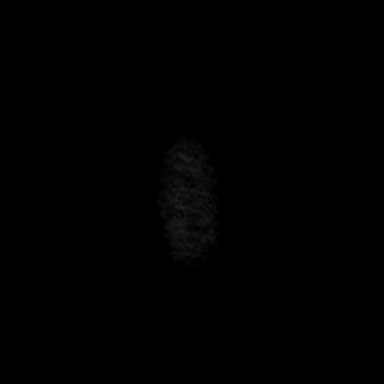

[Series 6: ax dwi_adc · axial · 3.0mm · 0.60mm/px · z∈[-86,+75]mm · 3 of 50 slices shown]
[im 1/50]
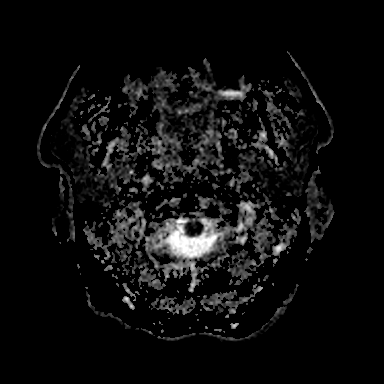
[im 25/50]
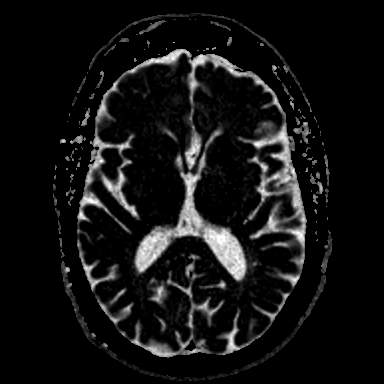
[im 50/50]
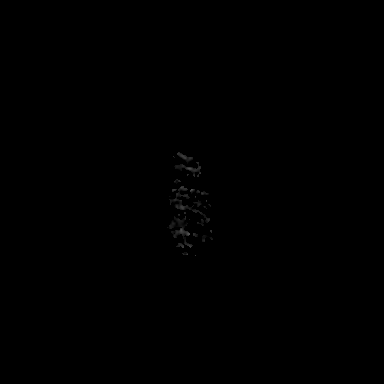

[Series 7: cor dwi_tracew · coronal · 5.0mm · 0.60mm/px · 3 of 40 slices shown]
[im 1/40]
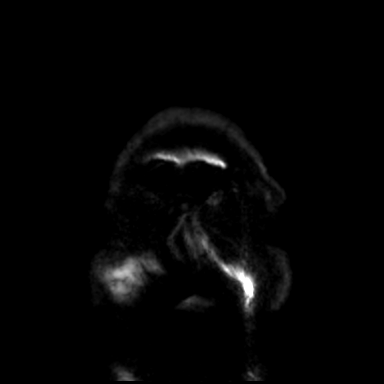
[im 20/40]
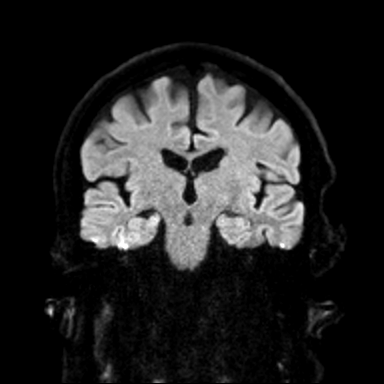
[im 40/40]
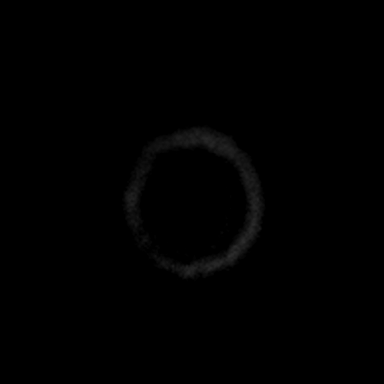

[Series 8: cor dwi_adc · coronal · 5.0mm · 0.60mm/px · 3 of 40 slices shown]
[im 1/40]
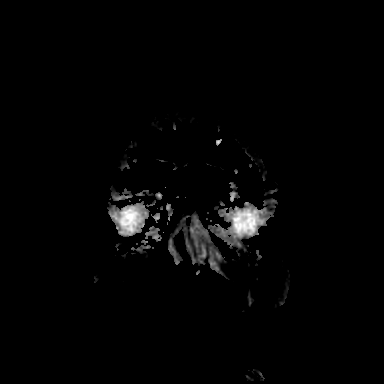
[im 20/40]
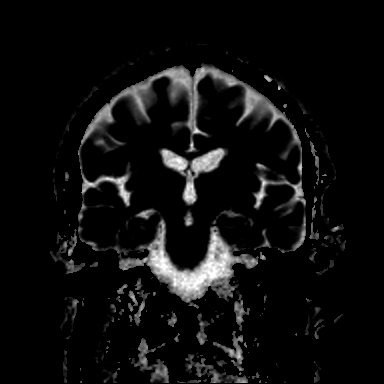
[im 40/40]
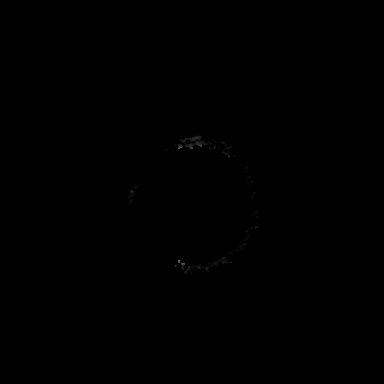

[Series 9: T1 · sagittal · 5.0mm · 0.62mm/px · 1 of 23 slices shown (1 of 2)]
[im 1/23]
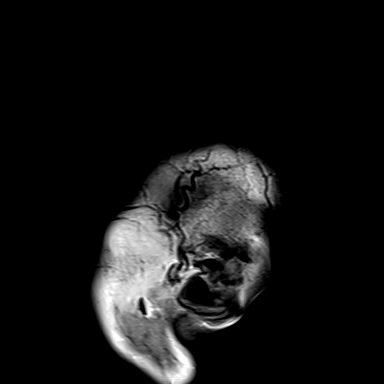

[Series 10: T2 · axial · 3.0mm · 0.53mm/px · z∈[-85,+75]mm · 3 of 50 slices shown (1 of 2)]
[im 1/50]
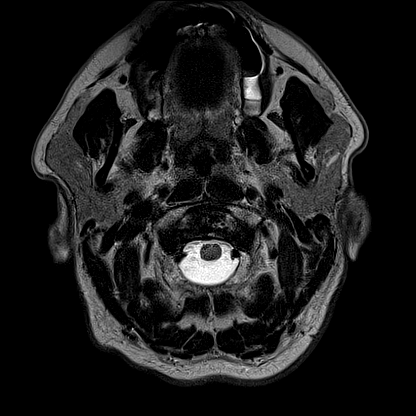
[im 25/50]
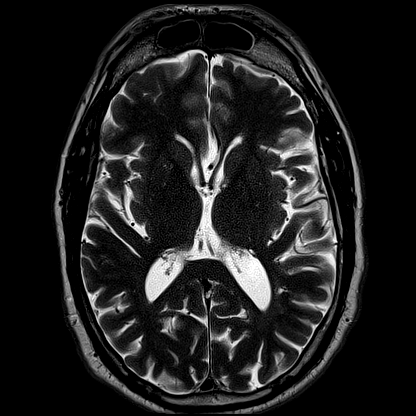
[im 50/50]
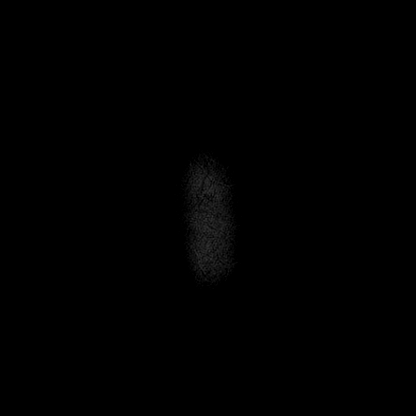

[Series 11: mag_images · axial · 3.0mm · 0.90mm/px · z∈[-93,+83]mm · 4 of 60 slices shown]
[im 1/60]
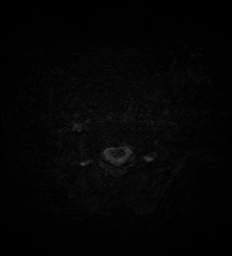
[im 20/60]
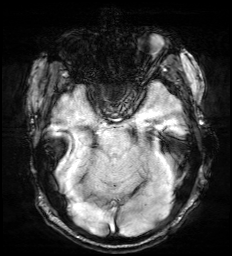
[im 40/60]
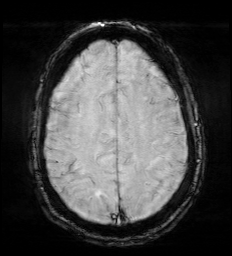
[im 60/60]
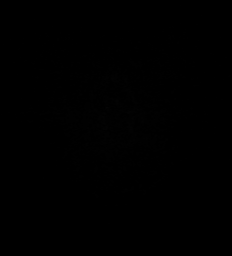

[Series 12: pha_images · axial · 3.0mm · 0.90mm/px · z∈[-93,+83]mm · 4 of 57 slices shown]
[im 1/57]
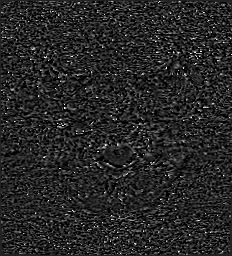
[im 19/57]
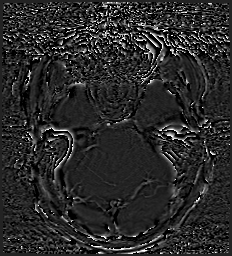
[im 38/57]
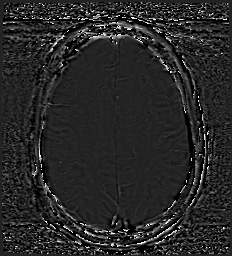
[im 57/57]
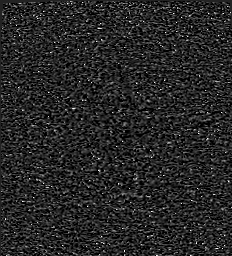

[Series 13: swi_images · axial · 3.0mm · 0.90mm/px · 1 of 60 slices shown]
[im 1/60]
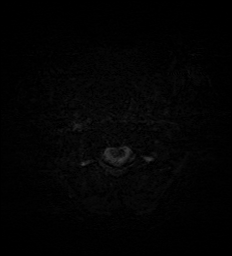

[Series 15: FLAIR · axial · 3.0mm · 0.53mm/px · z∈[-85,+76]mm · 4 of 55 slices shown]
[im 1/55]
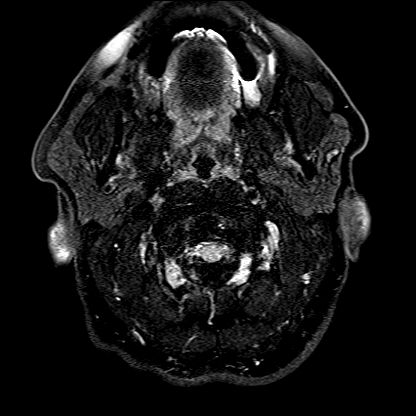
[im 19/55]
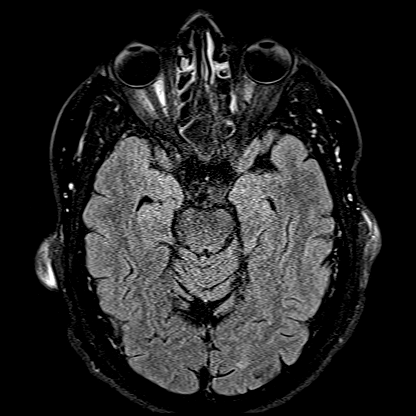
[im 37/55]
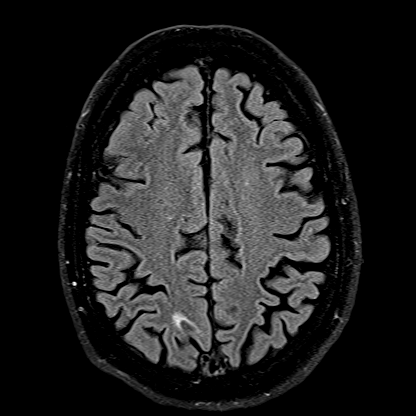
[im 55/55]
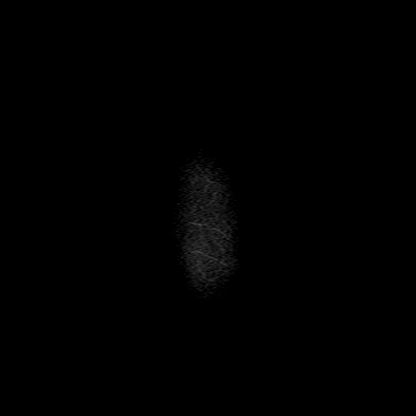

[Series 16: T1 · axial · 1.0mm · 0.98mm/px · z∈[-93,+80]mm · 8 of 176 slices shown (2 of 2)]
[im 1/176]
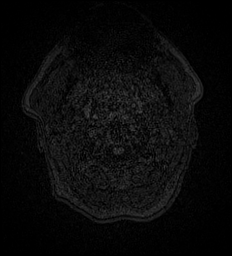
[im 36/176]
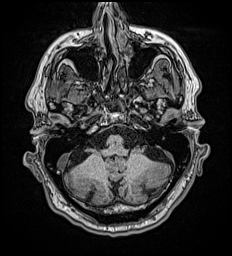
[im 53/176]
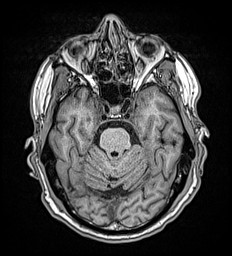
[im 71/176]
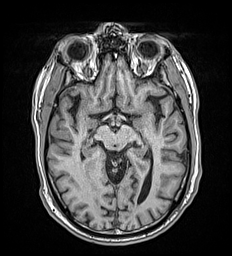
[im 106/176]
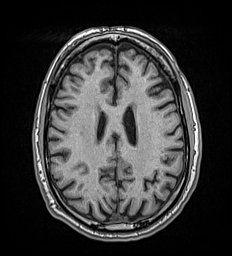
[im 123/176]
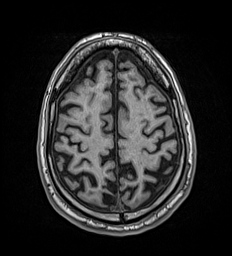
[im 141/176]
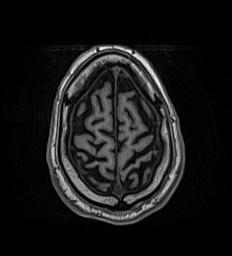
[im 176/176]
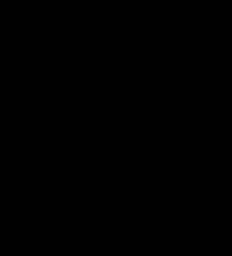

[Series 17: T2 · coronal · 5.0mm · 0.57mm/px · 3 of 40 slices shown (2 of 2)]
[im 1/40]
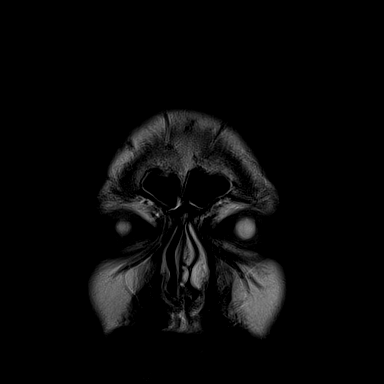
[im 20/40]
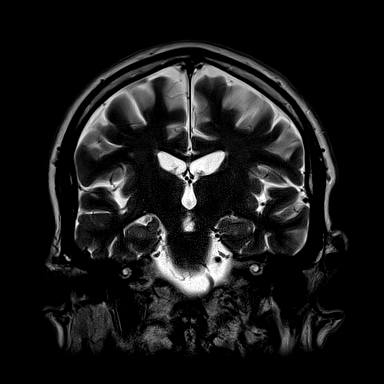
[im 40/40]
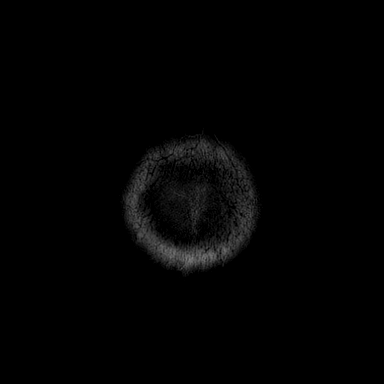

[42 of 48 positions shown; findings below may reference images not displayed]

FINDINGS: Brain: There is no evidence of acute infarct, intracranial
hemorrhage, mass, midline shift, or extra-axial fluid collection. A
small chronic infarct is noted medially in the right parietal lobe.
Small foci of T2 hyperintensity scattered throughout the cerebral
white matter bilaterally are mildly advanced for age and nonspecific
but compatible with chronic small vessel ischemic disease. There is
mild cerebral and cerebellar atrophy.

Vascular: Major intracranial vascular flow voids are preserved.

Skull and upper cervical spine: Unremarkable bone marrow signal.

Sinuses/Orbits: Unremarkable orbits. Mild mucosal thickening
throughout the paranasal sinuses. Left maxillary sinus mucous
retention cyst. Clear mastoid air cells.

Other: None.
IMPRESSION: 1. No acute intracranial abnormality.
2. Mild chronic small vessel ischemic disease and cerebral atrophy.
3. Small chronic right parietal infarct.

## 2021-06-25 ENCOUNTER — Other Ambulatory Visit: Payer: Self-pay | Admitting: Internal Medicine

## 2021-07-08 ENCOUNTER — Encounter: Payer: Self-pay | Admitting: Internal Medicine

## 2021-07-08 ENCOUNTER — Ambulatory Visit: Payer: BLUE CROSS/BLUE SHIELD | Admitting: Internal Medicine

## 2021-07-08 VITALS — BP 132/81 | HR 82 | Ht 76.0 in | Wt 217.5 lb

## 2021-07-08 DIAGNOSIS — F10930 Alcohol use, unspecified with withdrawal, uncomplicated: Secondary | ICD-10-CM | POA: Diagnosis not present

## 2021-07-08 DIAGNOSIS — I10 Essential (primary) hypertension: Secondary | ICD-10-CM

## 2021-07-08 DIAGNOSIS — E6609 Other obesity due to excess calories: Secondary | ICD-10-CM

## 2021-07-08 DIAGNOSIS — F17290 Nicotine dependence, other tobacco product, uncomplicated: Secondary | ICD-10-CM | POA: Diagnosis not present

## 2021-07-08 DIAGNOSIS — Z6834 Body mass index (BMI) 34.0-34.9, adult: Secondary | ICD-10-CM

## 2021-07-08 NOTE — Assessment & Plan Note (Signed)
Patient has quit smoking ?

## 2021-07-08 NOTE — Assessment & Plan Note (Signed)
Patient does not drink anymore. 

## 2021-07-08 NOTE — Assessment & Plan Note (Signed)
Patient rate is under control right now

## 2021-07-08 NOTE — Assessment & Plan Note (Signed)

## 2021-07-08 NOTE — Progress Notes (Signed)
Established Patient Office Visit  Subjective:  Patient ID: Bobby Williams, male    DOB: 1966-01-01  Age: 56 y.o. MRN: [id number]  CC:  Chief Complaint  Patient presents with   Hypertension    Patient is here for his 2 month BP follow up.    Hypertension    Donato Heinz presents for check up  Past Medical History:  Diagnosis Date   Anxiety    Hypertension    Pneumonia     Past Surgical History:  Procedure Laterality Date   NO PAST SURGERIES      Family History  Problem Relation Age of Onset   Diabetes Neg Hx     Social History   Socioeconomic History   Marital status: Married    Spouse name: Kiimberly   Number of children: 1   Years of education: Not on file   Highest education level: Not on file  Occupational History   Not on file  Tobacco Use   Smoking status: Never   Smokeless tobacco: Former    Types: Chew    Quit date: 08/27/2016   Tobacco comments:    nicotine Lozenges 82m 4-6 times  Vaping Use   Vaping Use: Never used  Substance and Sexual Activity   Alcohol use: Yes    Alcohol/week: 28.0 standard drinks of alcohol    Types: 28 Glasses of wine per week    Comment: 4 glasses per night   Drug use: Never   Sexual activity: Yes    Partners: Female  Other Topics Concern   Not on file  Social History Narrative   Right handed      Highest level of edu-bachelors      Lives with wife and one child   Social Determinants of Health   Financial Resource Strain: Low Risk  (12/03/2018)   Overall Financial Resource Strain (CARDIA)    Difficulty of Paying Living Expenses: Not hard at all  Food Insecurity: No Food Insecurity (12/03/2018)   Hunger Vital Sign    Worried About Running Out of Food in the Last Year: Never true    RFidelityin the Last Year: Never true  Transportation Needs: No Transportation Needs (12/03/2018)   PRAPARE - THydrologist(Medical): No    Lack of Transportation (Non-Medical): No  Physical  Activity: Inactive (12/03/2018)   Exercise Vital Sign    Days of Exercise per Week: 0 days    Minutes of Exercise per Session: 0 min  Stress: No Stress Concern Present (12/03/2018)   FNapoleon   Feeling of Stress : Only a little  Social Connections: Moderately Integrated (12/03/2018)   Social Connection and Isolation Panel [NHANES]    Frequency of Communication with Friends and Family: More than three times a week    Frequency of Social Gatherings with Friends and Family: Never    Attends Religious Services: 1 to 4 times per year    Active Member of [employer] Organizations: No    Attends CArchivistMeetings: Never    Marital Status: Married  IHuman resources officerViolence: Not At Risk (12/03/2018)   Humiliation, Afraid, Rape, and Kick questionnaire    Fear of Current or Ex-Partner: No    Emotionally Abused: No    Physically Abused: No    Sexually Abused: No     Current Outpatient Medications:    amLODipine (NORVASC) 10 MG tablet, TAKE  1 TABLET BY MOUTH EVERY DAY, Disp: 90 tablet, Rfl: 1   chlorthalidone (HYGROTON) 25 MG tablet, TAKE 1 TABLET (25 MG TOTAL) BY MOUTH DAILY., Disp: 90 tablet, Rfl: 0   naltrexone (DEPADE) 50 MG tablet, Take 1 tablet (50 mg total) by mouth at bedtime., Disp: 30 tablet, Rfl: 6   potassium chloride SA (KLOR-CON) 20 MEQ tablet, Take 1 tablet (20 mEq total) by mouth daily., Disp: 30 tablet, Rfl: 2   sertraline (ZOLOFT) 50 MG tablet, TAKE 1 TABLET BY MOUTH EVERY DAY, Disp: 90 tablet, Rfl: 2   traZODone (DESYREL) 100 MG tablet, TAKE 1 TABLET BY MOUTH EVERYDAY AT BEDTIME, Disp: 90 tablet, Rfl: 3   No Known Allergies  ROS Review of Systems  Constitutional: Negative.   HENT: Negative.    Eyes: Negative.   Respiratory: Negative.    Cardiovascular: Negative.   Gastrointestinal: Negative.   Endocrine: Negative.   Genitourinary: Negative.   Musculoskeletal: Negative.   Skin: Negative.    Allergic/Immunologic: Negative.   Neurological: Negative.   Hematological: Negative.   Psychiatric/Behavioral: Negative.    All other systems reviewed and are negative.     Objective:    Physical Exam Vitals reviewed.  Constitutional:      Appearance: Normal appearance.  HENT:     Mouth/Throat:     Mouth: Mucous membranes are moist.  Eyes:     Pupils: Pupils are equal, round, and reactive to light.  Neck:     Vascular: No carotid bruit.  Cardiovascular:     Rate and Rhythm: Normal rate and regular rhythm.     Pulses: Normal pulses.     Heart sounds: Normal heart sounds.  Pulmonary:     Effort: Pulmonary effort is normal.     Breath sounds: Normal breath sounds.  Abdominal:     General: Bowel sounds are normal.     Palpations: Abdomen is soft. There is no hepatomegaly, splenomegaly or mass.     Tenderness: There is no abdominal tenderness.     Hernia: No hernia is present.  Musculoskeletal:     Cervical back: Neck supple.     Right lower leg: No edema.     Left lower leg: No edema.  Skin:    Findings: No rash.  Neurological:     Mental Status: He is alert and oriented to person, place, and time.     Motor: No weakness.  Psychiatric:        Mood and Affect: Mood normal.        Behavior: Behavior normal.     BP 132/81   Pulse 82   Ht 6' 4" (1.93 m)   Wt 217 lb 8 oz (98.7 kg)   BMI 26.47 kg/m  Wt Readings from Last 3 Encounters:  07/08/21 217 lb 8 oz (98.7 kg)  05/07/21 208 lb (94.3 kg)  10/29/20 240 lb 11.2 oz (109.2 kg)     Health Maintenance Due  Topic Date Due   COLONOSCOPY (Pts 45-3yr Insurance coverage will need to be confirmed)  Never done   Zoster Vaccines- Shingrix (1 of 2) Never done   COVID-19 Vaccine (3 - Moderna series) 07/08/2019    There are no preventive care reminders to display for this patient.  Lab Results  Component Value Date   TSH 3.98 08/27/2020   Lab Results  Component Value Date   WBC 5.7 10/29/2020   HGB 15.8  10/29/2020   HCT 46.0 10/29/2020   MCV 89.7 10/29/2020   PLT  335 10/29/2020   Lab Results  Component Value Date   NA 132 (L) 10/29/2020   K 2.8 (L) 10/29/2020   CO2 32 10/29/2020   GLUCOSE 113 (H) 10/29/2020   BUN 6 (L) 10/29/2020   CREATININE 0.77 10/29/2020   BILITOT 0.7 10/29/2020   ALKPHOS 53 10/18/2020   AST 117 (H) 10/29/2020   ALT 156 (H) 10/29/2020   PROT 7.2 10/29/2020   ALBUMIN 4.8 10/18/2020   CALCIUM 9.6 10/29/2020   ANIONGAP 18 (H) 10/18/2020   EGFR 106 10/29/2020   Lab Results  Component Value Date   CHOL 291 (H) 08/27/2020   Lab Results  Component Value Date   HDL 78 08/27/2020   Lab Results  Component Value Date   LDLCALC 194 (H) 08/27/2020   Lab Results  Component Value Date   TRIG 82 08/27/2020   Lab Results  Component Value Date   CHOLHDL 3.7 08/27/2020   No results found for: "HGBA1C"    Assessment & Plan:   Problem List Items Addressed This Visit       Cardiovascular and Mediastinum   Uncontrolled hypertension    The following hypertensive lifestyle modification were recommended and discussed:  1. Limiting alcohol intake to less than 1 oz/day of ethanol:(24 oz of beer or 8 oz of wine or 2 oz of 100-proof whiskey). 2. Take baby ASA 81 mg daily. 3. Importance of regular aerobic exercise and losing weight. 4. Reduce dietary saturated fat and cholesterol intake for overall cardiovascular health. 5. Maintaining adequate dietary potassium, calcium, and magnesium intake. 6. Regular monitoring of the blood pressure. 7. Reduce sodium intake to less than 100 mmol/day (less than 2.3 gm of sodium or less than 6 gm of sodium choride)         Other   Alcohol withdrawal syndrome without complication ([place]) - Primary    Patient does not drink anymore      Nicotine dependence    Patient has quit smoking.      Class 1 obesity due to excess calories with serious comorbidity and body mass index (BMI) of 34.0 to 34.9 in adult    Patient rate is  under control right now     Hypercholesterolemia  I advised the patient to follow Mediterranean diet This diet is rich in fruits vegetables and whole grain, and This diet is also rich in fish and lean meat Patient should also eat a handful of almonds or walnuts daily Recent heart study indicated that average follow-up on this kind of diet reduces the cardiovascular mortality by 50 to 70% We will do a fasting lipid panel and liver function test before the next visit  No orders of the defined types were placed in this encounter.   Follow-up: No follow-ups on file.    Cletis Athens, MD

## 2021-08-12 ENCOUNTER — Other Ambulatory Visit: Payer: Self-pay | Admitting: Internal Medicine

## 2021-08-12 ENCOUNTER — Other Ambulatory Visit: Payer: Self-pay | Admitting: *Deleted

## 2021-08-12 DIAGNOSIS — I10 Essential (primary) hypertension: Secondary | ICD-10-CM

## 2021-08-12 MED ORDER — AMLODIPINE BESYLATE 10 MG PO TABS: 10.0000 mg | ORAL_TABLET | Freq: Every day | ORAL | 1 refills | Status: DC

## 2021-12-09 ENCOUNTER — Ambulatory Visit: Payer: BLUE CROSS/BLUE SHIELD | Admitting: Internal Medicine

## 2022-01-28 ENCOUNTER — Other Ambulatory Visit: Payer: Self-pay | Admitting: Internal Medicine

## 2022-01-28 DIAGNOSIS — I10 Essential (primary) hypertension: Secondary | ICD-10-CM

## 2022-02-21 ENCOUNTER — Other Ambulatory Visit: Payer: Self-pay | Admitting: Internal Medicine

## 2022-02-21 DIAGNOSIS — I10 Essential (primary) hypertension: Secondary | ICD-10-CM

## 2023-06-07 ENCOUNTER — Emergency Department
Admission: EM | Admit: 2023-06-07 | Discharge: 2023-06-08 | Disposition: A | Discharge status: Home / Self Care | Payer: Self-pay | Attending: Emergency Medicine | Admitting: Emergency Medicine

## 2023-06-07 ENCOUNTER — Other Ambulatory Visit: Payer: Self-pay

## 2023-06-07 DIAGNOSIS — E876 Hypokalemia: Secondary | ICD-10-CM | POA: Insufficient documentation

## 2023-06-07 DIAGNOSIS — Y908 Blood alcohol level of 240 mg/100 ml or more: Secondary | ICD-10-CM | POA: Diagnosis not present

## 2023-06-07 DIAGNOSIS — F10920 Alcohol use, unspecified with intoxication, uncomplicated: Secondary | ICD-10-CM

## 2023-06-07 DIAGNOSIS — F10120 Alcohol abuse with intoxication, uncomplicated: Secondary | ICD-10-CM | POA: Insufficient documentation

## 2023-06-07 LAB — CBC WITH DIFFERENTIAL/PLATELET
Abs Immature Granulocytes: 0.01 10*3/uL (ref 0.00–0.07)
Basophils Absolute: 0.1 10*3/uL (ref 0.0–0.1)
Basophils Relative: 1 %
Eosinophils Absolute: 0.5 10*3/uL (ref 0.0–0.5)
Eosinophils Relative: 7 %
HCT: 48.1 % (ref 39.0–52.0)
Hemoglobin: 17.5 g/dL — ABNORMAL HIGH (ref 13.0–17.0)
Immature Granulocytes: 0 %
Lymphocytes Relative: 25 %
Lymphs Abs: 1.6 10*3/uL (ref 0.7–4.0)
MCH: 31.3 pg (ref 26.0–34.0)
MCHC: 36.4 g/dL — ABNORMAL HIGH (ref 30.0–36.0)
MCV: 86 fL (ref 80.0–100.0)
Monocytes Absolute: 0.5 10*3/uL (ref 0.1–1.0)
Monocytes Relative: 7 %
Neutro Abs: 3.8 10*3/uL (ref 1.7–7.7)
Neutrophils Relative %: 60 %
Platelets: 206 10*3/uL (ref 150–400)
RBC: 5.59 MIL/uL (ref 4.22–5.81)
RDW: 12.4 % (ref 11.5–15.5)
WBC: 6.4 10*3/uL (ref 4.0–10.5)
nRBC: 0 % (ref 0.0–0.2)

## 2023-06-07 LAB — BASIC METABOLIC PANEL WITH GFR
Anion gap: 13 (ref 5–15)
BUN: 8 mg/dL (ref 6–20)
CO2: 29 mmol/L (ref 22–32)
Calcium: 9.2 mg/dL (ref 8.9–10.3)
Chloride: 95 mmol/L — ABNORMAL LOW (ref 98–111)
Creatinine, Ser: 0.78 mg/dL (ref 0.61–1.24)
GFR, Estimated: >60 mL/min (ref >60)
Glucose, Bld: 117 mg/dL — ABNORMAL HIGH (ref 70–99)
Potassium: 3.1 mmol/L — ABNORMAL LOW (ref 3.5–5.1)
Sodium: 137 mmol/L (ref 135–145)

## 2023-06-07 LAB — ETHANOL: Alcohol, Ethyl (B): 423 mg/dL (ref <15)

## 2023-06-07 MED ORDER — POTASSIUM CHLORIDE CRYS ER 20 MEQ PO TBCR
40.0000 meq | EXTENDED_RELEASE_TABLET | Freq: Once | ORAL | Status: AC
  Administered 2023-06-07: 40 meq via ORAL
  Filled 2023-06-07: qty 2

## 2023-06-07 MED ORDER — ONDANSETRON 4 MG PO TBDP
4.0000 mg | ORAL_TABLET | Freq: Once | ORAL | Status: AC
  Administered 2023-06-07: 4 mg via ORAL
  Filled 2023-06-07: qty 1

## 2023-06-07 MED ORDER — POTASSIUM CHLORIDE CRYS ER 20 MEQ PO TBCR: 20.0000 meq | EXTENDED_RELEASE_TABLET | Freq: Every day | ORAL | 0 refills | Status: DC

## 2023-06-07 NOTE — Discharge Instructions (Addendum)
 Please focus on limiting the amount of alcohol  you intake daily.  You can start taking multivitamins as well as alcohol  can significantly deplete this.  I have provided resources for rehab centers within the area which may provide you benefit.

## 2023-06-07 NOTE — ED Notes (Signed)
 Pt yelling at me stating he has watched me for over an hour, that I haven't helped him & have just been laughing at him. Reminded pt I just took over his care & will get him something to drink. Pt reminded of situation, asked to stop yelling & being hostile. Pt apologized & is sitting quietly on stretcher now drinking water.

## 2023-06-07 NOTE — ED Provider Notes (Signed)
 [facility] Provider Note    Event Date/Time   First MD Initiated Contact with Patient 06/07/23 2144     (approximate)   History   Alcohol  Problem   HPI DONIQUE BILLICK is a 58 y.o. male presenting today for alcohol  intoxication.  Patient states he has had a history of alcohol  abuse ever since his wife left him 3 years ago.  He states he typically drinks 1 bottle of wine per day.  Today he felt worse than normal and admits to having 4 bottles of wine.  He stated he sat down on the ground and passed out in a parking lot in [place].  He denies any head or neck injury.  Denies any pain symptoms anywhere at all.  Other than being intoxicated has no acute symptoms at this time and is tearful.  Denies SI or HI.     Physical Exam   Triage Vital Signs: ED Triage Vitals  Encounter Vitals Group     BP 06/07/23 2156 (!) 145/104     Systolic BP Percentile --      Diastolic BP Percentile --      Pulse Rate 06/07/23 2156 (!) 107     Resp 06/07/23 2156 (!) 22     Temp 06/07/23 2156 97.6 F (36.4 C)     Temp Source 06/07/23 2156 Oral     SpO2 06/07/23 2156 95 %     Weight 06/07/23 2151 250 lb (113.4 kg)     Height 06/07/23 2151 6\' 4"  (1.93 m)     Head Circumference --      Peak Flow --      Pain Score 06/07/23 2153 0     Pain Loc --      Pain Education --      Exclude from Growth Chart --     Most recent vital signs: Vitals:   06/07/23 2156  BP: (!) 145/104  Pulse: (!) 107  Resp: (!) 22  Temp: 97.6 F (36.4 C)  SpO2: 95%   I have reviewed the vital signs. General:  Awake, alert, tearful and obviously intoxicated Head:  Normocephalic, Atraumatic. EENT:  PERRL, EOMI, Oral mucosa pink and moist, Neck is supple. Cardiovascular: Regular rate, 2+ distal pulses. Respiratory:  Normal respiratory effort, symmetrical expansion, no distress.   Extremities:  Moving all four extremities through full ROM without pain.   Neuro:  Alert and oriented.  Interacting  appropriately.   Skin:  Warm, dry, no rash.   Psych: Appropriate affect.     ED Results / Procedures / Treatments   Labs (all labs ordered are listed, but only abnormal results are displayed) Labs Reviewed  ETHANOL  BASIC METABOLIC PANEL WITH GFR  CBC WITH DIFFERENTIAL/PLATELET     EKG My EKG interpretation: Rate of 105, sinus tachycardia with first-degree AV block.  Right axis deviation.  No acute ST elevations or depressions   RADIOLOGY    PROCEDURES:  Critical Care performed: No  Procedures   MEDICATIONS ORDERED IN ED: Medications  ondansetron  (ZOFRAN -ODT) disintegrating tablet 4 mg (4 mg Oral Given 06/07/23 2218)     IMPRESSION / MDM / ASSESSMENT AND PLAN / ED COURSE  I reviewed the triage vital signs and the nursing notes.                              Differential diagnosis includes, but is not limited to, alcohol  intoxication, electrolyte abnormality,  dehydration  Patient's presentation is most consistent with acute complicated illness / injury requiring diagnostic workup.  Patient is a 58 year old male with chronic alcohol  abuse presenting today for acute intoxication.  Denies passing out or hitting his head.  Denies injury elsewhere.  Admits to drinking 4 bottles of wine today.  Tearful during our interaction.  Will give Zofran  to help with any nausea symptoms while metabolizing alcohol .  Patient signed out pending completion of laboratory workup and metabolization.  The patient is on the cardiac monitor to evaluate for evidence of arrhythmia and/or significant heart rate changes.     FINAL CLINICAL IMPRESSION(S) / ED DIAGNOSES   Final diagnoses:  Alcoholic intoxication without complication (HCC)     Rx / DC Orders   ED Discharge Orders     None        Note:  This document was prepared using Dragon voice recognition software and may include unintentional dictation errors.   Kandee Orion, MD 06/07/23 [phone]

## 2023-06-07 NOTE — ED Notes (Signed)
 Pt provided urinal & privacy screen placed in order for pt to use urinal.

## 2023-06-07 NOTE — ED Triage Notes (Addendum)
 Pt to ED via [facility] c/o ETOH intoxication , pt was found laying on the ground in the parking lot of [facility]. Pt reports drinking all day. Pt tearful during triage, reports problem with ETOH x 3 years since wife left him. Went to rehab 2022. Denies SI.  Pt appears to be intoxicated on assessment   Last VS: 164/112, p120, 97%RA

## 2023-06-07 NOTE — ED Provider Notes (Signed)
-----------------------------------------   11:11 PM on 06/07/2023 -----------------------------------------  Assuming care from Dr. Karlynn Oyster.  In short, Bobby Williams is a 58 y.o. male with a chief complaint of alcohol  intoxication.  Refer to the original H&P for additional details.  The current plan of care is to monitor until safe.   Clinical Course as of 06/08/23 0731  Sun Jun 07, 2023  2346 Patient's metabolic panel and CBC are normal other than some mild hypokalemia.  I will order 40 mill equivalent oral repletion.  Ethanol level is still pending.  However, I reassessed the patient myself.  He is awake, alert, knows that he drank too much tonight, and is able to ambulate without difficulty.  He plans to call a [employer] to pick him up and take him home.  He does not seem to be in any danger at this time; though he is not appropriate to drive, he should be fine taking a ride share home and I am not concerned for airway protection.  He said he has somebody at home that will be there with him as well.  I will discharge him with my usual recommendations including close outpatient follow-up. [CF]  Mon Jun 08, 2023  0000 Alcohol , Ethyl (B)(!!): 423 Extremely high, but as previously documented, patient is stable clinically, able to ambulate, etc.  Does not represent an immediate danger to himself. [CF]    Clinical Course User Index [CF] Lynnda Sas, MD     Medications  ondansetron  (ZOFRAN -ODT) disintegrating tablet 4 mg (4 mg Oral Given 06/07/23 2218)  potassium chloride  SA (KLOR-CON  M) CR tablet 40 mEq (40 mEq Oral Given 06/07/23 2356)     ED Discharge Orders          Ordered    potassium chloride  SA (KLOR-CON  M20) 20 MEQ tablet  Daily        06/07/23 2349           Final diagnoses:  Alcoholic intoxication without complication (HCC)  Hypokalemia     Lynnda Sas, MD 06/08/23 [phone]

## 2023-11-29 ENCOUNTER — Inpatient Hospital Stay
Admission: EM | Admit: 2023-11-29 | Discharge: 2023-12-03 | DRG: 897 | Disposition: A | Discharge status: Home / Self Care | Payer: MEDICAID | Attending: Internal Medicine | Admitting: Internal Medicine

## 2023-11-29 ENCOUNTER — Other Ambulatory Visit: Payer: Self-pay

## 2023-11-29 ENCOUNTER — Encounter: Payer: Self-pay | Admitting: Internal Medicine

## 2023-11-29 ENCOUNTER — Emergency Department: Payer: Self-pay

## 2023-11-29 DIAGNOSIS — Z6830 Body mass index (BMI) 30.0-30.9, adult: Secondary | ICD-10-CM | POA: Diagnosis not present

## 2023-11-29 DIAGNOSIS — Z87891 Personal history of nicotine dependence: Secondary | ICD-10-CM | POA: Diagnosis not present

## 2023-11-29 DIAGNOSIS — E66811 Obesity, class 1: Secondary | ICD-10-CM | POA: Diagnosis present

## 2023-11-29 DIAGNOSIS — R7401 Elevation of levels of liver transaminase levels: Secondary | ICD-10-CM | POA: Diagnosis present

## 2023-11-29 DIAGNOSIS — D696 Thrombocytopenia, unspecified: Secondary | ICD-10-CM | POA: Diagnosis present

## 2023-11-29 DIAGNOSIS — R000 Tachycardia, unspecified: Secondary | ICD-10-CM | POA: Diagnosis present

## 2023-11-29 DIAGNOSIS — F32A Depression, unspecified: Secondary | ICD-10-CM | POA: Diagnosis present

## 2023-11-29 DIAGNOSIS — Z6832 Body mass index (BMI) 32.0-32.9, adult: Secondary | ICD-10-CM

## 2023-11-29 DIAGNOSIS — K760 Fatty (change of) liver, not elsewhere classified: Secondary | ICD-10-CM | POA: Diagnosis present

## 2023-11-29 DIAGNOSIS — Z79899 Other long term (current) drug therapy: Secondary | ICD-10-CM | POA: Diagnosis not present

## 2023-11-29 DIAGNOSIS — E860 Dehydration: Secondary | ICD-10-CM | POA: Diagnosis present

## 2023-11-29 DIAGNOSIS — F10939 Alcohol use, unspecified with withdrawal, unspecified: Secondary | ICD-10-CM | POA: Diagnosis present

## 2023-11-29 DIAGNOSIS — R945 Abnormal results of liver function studies: Secondary | ICD-10-CM | POA: Diagnosis present

## 2023-11-29 DIAGNOSIS — Z8249 Family history of ischemic heart disease and other diseases of the circulatory system: Secondary | ICD-10-CM

## 2023-11-29 DIAGNOSIS — F10230 Alcohol dependence with withdrawal, uncomplicated: Secondary | ICD-10-CM | POA: Diagnosis present

## 2023-11-29 DIAGNOSIS — E038 Other specified hypothyroidism: Secondary | ICD-10-CM | POA: Diagnosis present

## 2023-11-29 DIAGNOSIS — I10 Essential (primary) hypertension: Secondary | ICD-10-CM | POA: Diagnosis present

## 2023-11-29 DIAGNOSIS — E669 Obesity, unspecified: Secondary | ICD-10-CM | POA: Diagnosis present

## 2023-11-29 DIAGNOSIS — F419 Anxiety disorder, unspecified: Secondary | ICD-10-CM | POA: Diagnosis present

## 2023-11-29 DIAGNOSIS — E872 Acidosis, unspecified: Secondary | ICD-10-CM | POA: Diagnosis present

## 2023-11-29 DIAGNOSIS — F10930 Alcohol use, unspecified with withdrawal, uncomplicated: Secondary | ICD-10-CM

## 2023-11-29 DIAGNOSIS — R0902 Hypoxemia: Secondary | ICD-10-CM | POA: Diagnosis not present

## 2023-11-29 DIAGNOSIS — F418 Other specified anxiety disorders: Secondary | ICD-10-CM | POA: Diagnosis present

## 2023-11-29 HISTORY — DX: Other specified anxiety disorders: F41.8

## 2023-11-29 HISTORY — DX: Alcohol abuse, uncomplicated: F10.10

## 2023-11-29 HISTORY — DX: Thrombocytopenia, unspecified: D69.6

## 2023-11-29 LAB — COMPREHENSIVE METABOLIC PANEL WITH GFR
ALT: 46 U/L — ABNORMAL HIGH (ref 0–44)
AST: 69 U/L — ABNORMAL HIGH (ref 15–41)
Albumin: 5.1 g/dL — ABNORMAL HIGH (ref 3.5–5.0)
Alkaline Phosphatase: 51 U/L (ref 38–126)
Anion gap: 24 — ABNORMAL HIGH (ref 5–15)
BUN: 10 mg/dL (ref 6–20)
CO2: 13 mmol/L — ABNORMAL LOW (ref 22–32)
Calcium: 9 mg/dL (ref 8.9–10.3)
Chloride: 98 mmol/L (ref 98–111)
Creatinine, Ser: 0.76 mg/dL (ref 0.61–1.24)
GFR, Estimated: >60 mL/min (ref >60)
Glucose, Bld: 131 mg/dL — ABNORMAL HIGH (ref 70–99)
Potassium: 3.7 mmol/L (ref 3.5–5.1)
Sodium: 135 mmol/L (ref 135–145)
Total Bilirubin: 1 mg/dL (ref 0.0–1.2)
Total Protein: 8.6 g/dL — ABNORMAL HIGH (ref 6.5–8.1)

## 2023-11-29 LAB — CBC WITH DIFFERENTIAL/PLATELET
Abs Immature Granulocytes: 0.01 K/uL (ref 0.00–0.07)
Basophils Absolute: 0.1 K/uL (ref 0.0–0.1)
Basophils Relative: 1 %
Eosinophils Absolute: 0.1 K/uL (ref 0.0–0.5)
Eosinophils Relative: 1 %
HCT: 46.4 % (ref 39.0–52.0)
Hemoglobin: 15.9 g/dL (ref 13.0–17.0)
Immature Granulocytes: 0 %
Lymphocytes Relative: 22 %
Lymphs Abs: 1.4 K/uL (ref 0.7–4.0)
MCH: 32.1 pg (ref 26.0–34.0)
MCHC: 34.3 g/dL (ref 30.0–36.0)
MCV: 93.7 fL (ref 80.0–100.0)
Monocytes Absolute: 0.7 K/uL (ref 0.1–1.0)
Monocytes Relative: 12 %
Neutro Abs: 4 K/uL (ref 1.7–7.7)
Neutrophils Relative %: 64 %
Platelets: 120 K/uL — ABNORMAL LOW (ref 150–400)
RBC: 4.95 MIL/uL (ref 4.22–5.81)
RDW: 13.4 % (ref 11.5–15.5)
WBC: 6.3 K/uL (ref 4.0–10.5)
nRBC: 0 % (ref 0.0–0.2)

## 2023-11-29 LAB — BLOOD GAS, VENOUS
Acid-base deficit: 2.6 mmol/L — ABNORMAL HIGH (ref 0.0–2.0)
Bicarbonate: 19.9 mmol/L — ABNORMAL LOW (ref 20.0–28.0)
O2 Saturation: 93.1 %
Patient temperature: 37
pCO2, Ven: 28 mmHg — ABNORMAL LOW (ref 44–60)
pH, Ven: 7.46 — ABNORMAL HIGH (ref 7.25–7.43)
pO2, Ven: 62 mmHg — ABNORMAL HIGH (ref 32–45)

## 2023-11-29 LAB — ACETAMINOPHEN LEVEL: Acetaminophen (Tylenol), Serum: <10 ug/mL — ABNORMAL LOW (ref 10–30)

## 2023-11-29 LAB — SALICYLATE LEVEL: Salicylate Lvl: <7 mg/dL — ABNORMAL LOW (ref 7.0–30.0)

## 2023-11-29 LAB — TSH: TSH: 6.605 u[IU]/mL — ABNORMAL HIGH (ref 0.350–4.500)

## 2023-11-29 LAB — CK: Total CK: 203 U/L (ref 49–397)

## 2023-11-29 LAB — ETHANOL: Alcohol, Ethyl (B): <15 mg/dL (ref <15)

## 2023-11-29 LAB — MAGNESIUM: Magnesium: 2.1 mg/dL (ref 1.7–2.4)

## 2023-11-29 LAB — TROPONIN I (HIGH SENSITIVITY): Troponin I (High Sensitivity): 3 ng/L (ref <18)

## 2023-11-29 LAB — PHOSPHORUS: Phosphorus: 3.9 mg/dL (ref 2.5–4.6)

## 2023-11-29 LAB — BETA-HYDROXYBUTYRIC ACID: Beta-Hydroxybutyric Acid: 0.95 mmol/L — ABNORMAL HIGH (ref 0.05–0.27)

## 2023-11-29 MED ORDER — PHENOBARBITAL SODIUM 130 MG/ML IJ SOLN
130.0000 mg | Freq: Once | INTRAMUSCULAR | Status: AC
  Administered 2023-11-29: 130 mg via INTRAVENOUS
  Filled 2023-11-29: qty 1

## 2023-11-29 MED ORDER — LORAZEPAM 1 MG PO TABS
1.0000 mg | ORAL_TABLET | ORAL | Status: DC | PRN
  Administered 2023-11-30: 2 mg via ORAL
  Administered 2023-11-30 (×2): 1 mg via ORAL
  Administered 2023-12-01: 2 mg via ORAL
  Administered 2023-12-01: 1 mg via ORAL
  Administered 2023-12-02: 2 mg via ORAL
  Filled 2023-11-29 (×2): qty 2
  Filled 2023-11-29: qty 1
  Filled 2023-11-29: qty 2
  Filled 2023-11-29 (×2): qty 1

## 2023-11-29 MED ORDER — PHENOBARBITAL SODIUM 130 MG/ML IJ SOLN
130.0000 mg | Freq: Once | INTRAMUSCULAR | Status: AC
  Administered 2023-11-29: 130 mg via INTRAVENOUS

## 2023-11-29 MED ORDER — MIDAZOLAM HCL (PF) 2 MG/2ML IJ SOLN
5.0000 mg | Freq: Once | INTRAMUSCULAR | Status: AC
  Administered 2023-11-29: 5 mg via INTRAVENOUS
  Filled 2023-11-29: qty 6

## 2023-11-29 MED ORDER — ONDANSETRON HCL 4 MG/2ML IJ SOLN
4.0000 mg | Freq: Once | INTRAMUSCULAR | Status: AC
  Administered 2023-11-29: 4 mg via INTRAVENOUS
  Filled 2023-11-29: qty 2

## 2023-11-29 MED ORDER — LACTATED RINGERS IV BOLUS
1000.0000 mL | Freq: Once | INTRAVENOUS | Status: AC
  Administered 2023-11-29: 1000 mL via INTRAVENOUS

## 2023-11-29 MED ORDER — PHENOBARBITAL SODIUM 130 MG/ML IJ SOLN: 130.0000 mg | Freq: Once | INTRAMUSCULAR | Status: DC

## 2023-11-29 MED ORDER — METOPROLOL TARTRATE 5 MG/5ML IV SOLN
INTRAVENOUS | Status: AC
  Administered 2023-11-29: 5 mg via INTRAVENOUS
  Filled 2023-11-29: qty 5

## 2023-11-29 MED ORDER — PHENOBARBITAL SODIUM 65 MG/ML IJ SOLN
65.0000 mg | Freq: Three times a day (TID) | INTRAMUSCULAR | Status: DC
  Administered 2023-12-02 – 2023-12-03 (×5): 65 mg via INTRAVENOUS
  Filled 2023-11-29 (×5): qty 1

## 2023-11-29 MED ORDER — ENOXAPARIN SODIUM 40 MG/0.4ML IJ SOSY
40.0000 mg | PREFILLED_SYRINGE | INTRAMUSCULAR | Status: DC
  Administered 2023-11-29 – 2023-12-02 (×4): 40 mg via SUBCUTANEOUS
  Filled 2023-11-29 (×4): qty 0.4

## 2023-11-29 MED ORDER — THIAMINE HCL 100 MG/ML IJ SOLN
100.0000 mg | Freq: Every day | INTRAMUSCULAR | Status: DC
  Administered 2023-11-29 – 2023-12-01 (×3): 100 mg via INTRAVENOUS
  Filled 2023-11-29 (×3): qty 2

## 2023-11-29 MED ORDER — LORAZEPAM 2 MG/ML IJ SOLN
1.0000 mg | INTRAMUSCULAR | Status: DC | PRN
  Administered 2023-11-29: 3 mg via INTRAVENOUS
  Administered 2023-11-29: 2 mg via INTRAVENOUS
  Administered 2023-11-30 – 2023-12-01 (×3): 1 mg via INTRAVENOUS
  Filled 2023-11-29 (×2): qty 1
  Filled 2023-11-29: qty 2
  Filled 2023-11-29 (×2): qty 1

## 2023-11-29 MED ORDER — THIAMINE MONONITRATE 100 MG PO TABS: 100.0000 mg | ORAL_TABLET | Freq: Every day | ORAL | Status: DC

## 2023-11-29 MED ORDER — IBUPROFEN 400 MG PO TABS
400.0000 mg | ORAL_TABLET | Freq: Four times a day (QID) | ORAL | Status: DC | PRN
  Administered 2023-12-01: 400 mg via ORAL
  Filled 2023-11-29: qty 1

## 2023-11-29 MED ORDER — DILTIAZEM HCL-DEXTROSE 125-5 MG/125ML-% IV SOLN (PREMIX)
5.0000 mg/h | INTRAVENOUS | Status: DC
  Administered 2023-11-29: 5 mg/h via INTRAVENOUS
  Filled 2023-11-29: qty 125

## 2023-11-29 MED ORDER — LACTATED RINGERS IV SOLN: INTRAVENOUS | Status: AC

## 2023-11-29 MED ORDER — PHENOBARBITAL SODIUM 130 MG/ML IJ SOLN: 260.0000 mg | Freq: Once | INTRAMUSCULAR | Status: DC

## 2023-11-29 MED ORDER — HYDRALAZINE HCL 20 MG/ML IJ SOLN
5.0000 mg | INTRAMUSCULAR | Status: DC | PRN
  Administered 2023-11-29 – 2023-11-30 (×2): 5 mg via INTRAVENOUS
  Filled 2023-11-29 (×2): qty 1

## 2023-11-29 MED ORDER — PHENOBARBITAL SODIUM 130 MG/ML IJ SOLN
97.5000 mg | Freq: Three times a day (TID) | INTRAMUSCULAR | Status: AC
  Administered 2023-11-30 – 2023-12-01 (×6): 97.5 mg via INTRAVENOUS
  Filled 2023-11-29 (×6): qty 1

## 2023-11-29 MED ORDER — SODIUM CHLORIDE 0.9 % IV BOLUS
1000.0000 mL | Freq: Once | INTRAVENOUS | Status: AC
  Administered 2023-11-29: 1000 mL via INTRAVENOUS

## 2023-11-29 MED ORDER — PHENOBARBITAL SODIUM 130 MG/ML IJ SOLN
130.0000 mg | Freq: Once | INTRAMUSCULAR | Status: DC
  Filled 2023-11-29: qty 1

## 2023-11-29 MED ORDER — METOPROLOL TARTRATE 5 MG/5ML IV SOLN
5.0000 mg | Freq: Once | INTRAVENOUS | Status: AC
  Administered 2023-11-29: 5 mg via INTRAVENOUS
  Filled 2023-11-29: qty 5

## 2023-11-29 MED ORDER — ADULT MULTIVITAMIN W/MINERALS CH
1.0000 | ORAL_TABLET | Freq: Every day | ORAL | Status: DC
  Administered 2023-11-29 – 2023-12-03 (×5): 1 via ORAL
  Filled 2023-11-29 (×5): qty 1

## 2023-11-29 MED ORDER — PHENOBARBITAL SODIUM 65 MG/ML IJ SOLN: 32.5000 mg | Freq: Three times a day (TID) | INTRAMUSCULAR | Status: DC

## 2023-11-29 MED ORDER — AMLODIPINE BESYLATE 10 MG PO TABS
10.0000 mg | ORAL_TABLET | Freq: Every day | ORAL | Status: DC
  Administered 2023-11-30 – 2023-12-03 (×4): 10 mg via ORAL
  Filled 2023-11-29 (×4): qty 1

## 2023-11-29 MED ORDER — SODIUM CHLORIDE 0.9 % IV SOLN
260.0000 mg | Freq: Once | INTRAVENOUS | Status: AC
  Administered 2023-11-29: 260 mg via INTRAVENOUS
  Filled 2023-11-29: qty 2

## 2023-11-29 MED ORDER — TRAZODONE HCL 50 MG PO TABS
50.0000 mg | ORAL_TABLET | Freq: Every evening | ORAL | Status: DC | PRN
Start: 2023-11-29 — End: 2023-12-03
  Administered 2023-11-30 – 2023-12-02 (×3): 50 mg via ORAL
  Filled 2023-11-29 (×3): qty 1

## 2023-11-29 MED ORDER — METOPROLOL TARTRATE 5 MG/5ML IV SOLN: 5.0000 mg | Freq: Once | INTRAVENOUS | Status: AC

## 2023-11-29 MED ORDER — FOLIC ACID 1 MG PO TABS
1.0000 mg | ORAL_TABLET | Freq: Every day | ORAL | Status: DC
  Administered 2023-11-29 – 2023-12-03 (×5): 1 mg via ORAL
  Filled 2023-11-29 (×5): qty 1

## 2023-11-29 MED ORDER — PANTOPRAZOLE SODIUM 40 MG IV SOLR
40.0000 mg | Freq: Once | INTRAVENOUS | Status: AC
  Administered 2023-11-29: 40 mg via INTRAVENOUS
  Filled 2023-11-29: qty 10

## 2023-11-29 MED ORDER — ONDANSETRON HCL 4 MG/2ML IJ SOLN: 4.0000 mg | Freq: Three times a day (TID) | INTRAMUSCULAR | Status: DC | PRN

## 2023-11-29 NOTE — ED Notes (Signed)
 Phenobarbital drip started and rate changed to run over 1 hour per Dr. Hilma verbal instruction.  Per MD, monitor pt respirations closey.  Pt RR 18-26 at this time.

## 2023-11-29 NOTE — ED Notes (Signed)
 Dr Hilma notified of pt HR, ordered 5mg  Metolprolol.

## 2023-11-29 NOTE — ED Notes (Signed)
 Pharmacy contacted for delivery of 2nd 130 mg dose of phenobarbital.

## 2023-11-29 NOTE — H&P (Signed)
 History and Physical    Bobby Williams FMW:[id number] DOB: April 30, 1965 DOA: 11/29/2023  Referring MD/NP/PA:   PCP: Britta King, MD   Patient coming from:  The patient is coming from home.     Chief Complaint: Alcohol  withdrawal  HPI: Bobby Williams is a 58 y.o. male with medical history significant of hypertension, depression with anxiety, thrombocytopenia, alcohol  abuse, who presents with alcohol  withdrawal.  Per patient and his son at the bedside, he drinks 3 bottles every night.  He reports that he last drank last night. Today he had tried to stop drinking which then prompted him to go into withdrawal.  Patient is tremulous, tachycardic with heart rate up to 190s. Pt is intermittently confused with delirium.  Patient nausea and few times of nonbilious nonbloody vomiting.  No diarrhea or abdominal pain.  No fever or chills.  No chest pain, cough, SOB.  No symptoms of UTI.  Per ED physician, patient did not have oxygen desaturation, but after he received Versed in ED, oxygen desaturated to 88% on room air, which improved to 96% on 3 L oxygen.  Patient does not have respiratory distress.  Denies fall or injury.  Data reviewed independently and ED Course: pt was found to have WBC 6.3, GFR> 60, bicarbonate 13 with anion gap 24, troponin 3.  Temperature normal, blood pressure 195/121, heart rate 100s-150s (was up to 190s), RR 21.  Chest x-ray negative.  Patient is admitted to stepdown as inpatient.   EKG: I have personally reviewed.  Sinus rhythm, QTc 425, low voltage, LAD, poor R progression, anteroseptal infarction pattern   Review of Systems:   General: no fevers, chills, no body weight gain, has poor appetite, has fatigue HEENT: no blurry vision, hearing changes or sore throat Respiratory: no dyspnea, coughing, wheezing CV: no chest pain, no palpitations GI: has nausea, vomiting, no abdominal pain, diarrhea, constipation GU: no dysuria, burning on urination, increased urinary  frequency, hematuria  Ext: no leg edema Neuro: no unilateral weakness, numbness, or tingling, no vision change or hearing loss. Has confusion intermittently Skin: no rash, no skin tear. MSK: No muscle spasm, no deformity, no limitation of range of movement in spin Heme: No easy bruising.  Travel history: No recent long distant travel.   Allergy: No Known Allergies  Past Medical History:  Diagnosis Date   Alcohol  abuse    Anxiety    Depression with anxiety    Hypertension    Pneumonia    Thrombocytopenia     Past Surgical History:  Procedure Laterality Date   NO PAST SURGERIES      Social History:  reports that he has never smoked. He quit smokeless tobacco use about 7 years ago.  His smokeless tobacco use included chew. He reports current alcohol  use of about 28.0 standard drinks of alcohol  per week. He reports that he does not use drugs.  Family History:  Family History  Problem Relation Age of Onset   Heart disease Mother    Kidney cancer Father    Diabetes Neg Hx      Prior to Admission medications   Medication Sig Start Date End Date Taking? Authorizing Provider  amLODipine  (NORVASC ) 10 MG tablet 1 TABLET DAILY AT 8:00 AM 08/13/21   Britta King, MD  chlorthalidone  (HYGROTON ) 25 MG tablet TAKE 1 TABLET (25 MG TOTAL) BY MOUTH DAILY. 01/28/22   Britta King, MD  naltrexone  (DEPADE) 50 MG tablet Take 1 tablet (50 mg total) by mouth at bedtime. 05/07/21  Britta King, MD  potassium chloride  SA (KLOR-CON  M20) 20 MEQ tablet Take 1 tablet (20 mEq total) by mouth daily. 06/07/23   Gordan Huxley, MD  sertraline  (ZOLOFT ) 50 MG tablet TAKE 1 TABLET BY MOUTH EVERY DAY 06/17/21   Britta King, MD  traZODone  (DESYREL ) 100 MG tablet TAKE 1 TABLET BY MOUTH EVERYDAY AT BEDTIME 06/25/21   Britta King, MD    Physical Exam: Vitals:   11/30/23 0015 11/30/23 0030 11/30/23 0100 11/30/23 0130  BP: (!) 153/86 (!) 160/96 (!) 152/101 (!) 156/96  Pulse: 93 91 81 87  Resp: (!) 21 (!) 22 (!)  25 19  Temp:      TempSrc:      SpO2: 98% 99% 97% 100%  Weight:      Height:       General: Not in acute distress.  Patient is tremulous.  Dry mucous membrane HEENT:       Eyes: PERRL, EOMI, no jaundice       ENT: No discharge from the ears and nose, no pharynx injection, no tonsillar enlargement.        Neck: No JVD, no bruit, no mass felt. Heme: No neck lymph node enlargement. Cardiac: S1/S2, RRR, No murmurs, No gallops or rubs. Respiratory: No rales, wheezing, rhonchi or rubs. GI: Soft, nondistended, nontender, no rebound pain, no organomegaly, BS present. GU: No hematuria Ext: No pitting leg edema bilaterally. 1+DP/PT pulse bilaterally. Musculoskeletal: No joint deformities, No joint redness or warmth, no limitation of ROM in spin. Skin: No rashes.  Neuro: Patient has delirium, intermittently confused.  cranial nerves II-XII grossly intact, moves all extremities normally.  Psych: Patient is not psychotic, no suicidal or hemocidal ideation.  Labs on Admission: I have personally reviewed following labs and imaging studies  CBC: Recent Labs  Lab 11/29/23 1825  WBC 6.3  NEUTROABS 4.0  HGB 15.9  HCT 46.4  MCV 93.7  PLT 120*   Basic Metabolic Panel: Recent Labs  Lab 11/29/23 1825  NA 135  K 3.7  CL 98  CO2 13*  GLUCOSE 131*  BUN 10  CREATININE 0.76  CALCIUM 9.0  MG 2.1  PHOS 3.9   GFR: Estimated Creatinine Clearance: 141.8 mL/min (by C-G formula based on SCr of 0.76 mg/dL). Liver Function Tests: Recent Labs  Lab 11/29/23 1825  AST 69*  ALT 46*  ALKPHOS 51  BILITOT 1.0  PROT 8.6*  ALBUMIN 5.1*   No results for input(s): LIPASE, AMYLASE in the last 168 hours. No results for input(s): AMMONIA in the last 168 hours. Coagulation Profile: No results for input(s): INR, PROTIME in the last 168 hours. Cardiac Enzymes: Recent Labs  Lab 11/29/23 1825  CKTOTAL 203   BNP (last 3 results) No results for input(s): PROBNP in the last 8760  hours. HbA1C: No results for input(s): HGBA1C in the last 72 hours. CBG: No results for input(s): GLUCAP in the last 168 hours. Lipid Profile: No results for input(s): CHOL, HDL, LDLCALC, TRIG, CHOLHDL, LDLDIRECT in the last 72 hours. Thyroid Function Tests: Recent Labs    11/29/23 1825  TSH 6.605*   Anemia Panel: No results for input(s): VITAMINB12, FOLATE, FERRITIN, TIBC, IRON, RETICCTPCT in the last 72 hours. Urine analysis:    Component Value Date/Time   COLORURINE YELLOW (A) 07/27/2020 1221   APPEARANCEUR HAZY (A) 07/27/2020 1221   LABSPEC 1.026 07/27/2020 1221   PHURINE 5.0 07/27/2020 1221   GLUCOSEU 50 (A) 07/27/2020 1221   HGBUR NEGATIVE 07/27/2020 1221   BILIRUBINUR  NEGATIVE 07/27/2020 1221   KETONESUR 5 (A) 07/27/2020 1221   PROTEINUR 30 (A) 07/27/2020 1221   NITRITE NEGATIVE 07/27/2020 1221   LEUKOCYTESUR NEGATIVE 07/27/2020 1221   Sepsis Labs: @LABRCNTIP (procalcitonin:4,lacticidven:4) )No results found for this or any previous visit (from the past 240 hours).   Radiological Exams on Admission:   Assessment/Plan Principal Problem:   Alcohol  withdrawal (HCC) Active Problems:   Sinus tachycardia   HTN (hypertension)   Metabolic acidosis   Abnormal liver function   Thrombocytopenia   Depression with anxiety   Obesity (BMI 30-39.9)   Assessment and Plan:   Alcohol  withdrawal [facility]): Patient has delirium with intermittent confusion, indicating delirium treatment. Patient is severely tremulous, and at high risk of developing seizure.  -Admitted to stepdown as inpatient - Started phenobarbital tapering with pharmacist consult - CIWA protocol - Fall precaution - IV fluid: 1 L LR, 1 L normal saline, then 75 cc/h of LR  Sinus tachycardia: Heart rate up to 190s.  Hemodynamically stable.  Most likely due to alcohol  withdrawal.  He received 2 doses of 5 mg of metoprolol, still tachycardia. - Start Cardizem drip  Hypertension: -  IV hydralazine  as needed - Continue amlodipine  - Hold Hygroton  since patient need IV fluid. - Patient is on Cardizem drip  Metabolic acidosis: Bicarbonate 13, anion gap 24, most likely due to alcoholic acidosis. -IV fluid  Abnormal liver function: ALP 51, AST 69, ALT 46, total bilirubin 1.0.  Most likely due to alcohol  abuse.  Patient had right upper quadrant ultrasound on 10/18/2020 which showed fatty liver. -Avoid using Tylenol  - Check ammonia level  Thrombocytopenia: Platelets are 120, chronic issue.  Likely due to alcohol  abuse -Follow-up by CBC  Depression with anxiety -Continue Wellbutrin and trazodone   Obesity (BMI 30-39.9): Patient has Obesity Class I, with body weight 119.2 Kg and BMI 32.5 kg/m2.  - Encourage losing weight - Exercise and healthy diet       DVT ppx:  SQ Lovenox   Code Status: Full code  Family Communication:   Yes, patient's son and wife    at bed side.      Disposition Plan:  Anticipate discharge back to previous environment  Consults called: None  Admission status and Level of care: Stepdown: as inpt        Dispo: The patient is from: Home              Anticipated d/c is to: Home              Anticipated d/c date is: 2 days              Patient currently is not medically stable to d/c.    Severity of Illness:  The appropriate patient status for this patient is INPATIENT. Inpatient status is judged to be reasonable and necessary in order to provide the required intensity of service to ensure the patient's safety. The patient's presenting symptoms, physical exam findings, and initial radiographic and laboratory data in the context of their chronic comorbidities is felt to place them at high risk for further clinical deterioration. Furthermore, it is not anticipated that the patient will be medically stable for discharge from the hospital within 2 midnights of admission.   * I certify that at the point of admission it is my clinical judgment  that the patient will require inpatient hospital care spanning beyond 2 midnights from the point of admission due to high intensity of service, high risk for further deterioration and high frequency of  surveillance required.*       Date of Service 11/30/2023    Caleb Exon [facility]   If 7PM-7AM, please contact night-coverage [email or web address] 11/30/2023, 1:42 AM

## 2023-11-29 NOTE — ED Triage Notes (Signed)
 Pt coming from home via EMS for alcohol  withdrawal. Last night was his last drink. C/O n/v, tremors, and both legs feeling numb.   EMS vitals BP 173/115 HR 127 SPO2 97%

## 2023-11-29 NOTE — ED Provider Notes (Signed)
 [facility] Provider Note    Event Date/Time   First MD Initiated Contact with Patient 11/29/23 1820     (approximate)   History   No chief complaint on file.   HPI  Bobby Williams is a 58 y.o. male with history of uncontrolled hypertension who comes in with concerns for alcohol  withdrawal.  Patient reports that he drinks 3 bottles every night.  He reports that he last drank last night and today he had tried to stop drinking which then prompted him to go into withdrawal.  He states that he has never had withdrawal like this before.  I reviewed a note on 07/08/2021 where it was noted that patient was not currently drinking at that time.  I did have a history of hypertension.  Patient reports having nausea, vomiting, feeling unwell.  He reports having really bad tremors.  He states that this feels worse than when he was here previously.  I reviewed where he was here in 2022 and was given some Ativan  and Librium  and improved.  Had a right upper quadrant ultrasound that was negative.  Patient denies any falls and hitting his head.  He denies any abdominal pain.  Just reports having nausea and vomiting when he tries to eat or drink anything.   Physical Exam   Triage Vital Signs: ED Triage Vitals  Encounter Vitals Group     BP      Girls Systolic BP Percentile      Girls Diastolic BP Percentile      Boys Systolic BP Percentile      Boys Diastolic BP Percentile      Pulse      Resp      Temp      Temp src      SpO2      Weight      Height      Head Circumference      Peak Flow      Pain Score      Pain Loc      Pain Education      Exclude from Growth Chart     Most recent vital signs: Vitals:   11/29/23 1829  BP: (!) 195/121  Pulse: (!) 124  Resp: 18  Temp: 98 F (36.7 C)  SpO2: 95%     General: Awake, no distress.  CV:  Good peripheral perfusion.  Tachycardic Resp:  Normal effort.  Abd:  No distention.  Soft and nontender Other:   No  swelling in legs.  Patient does appear tremulous  ED Results / Procedures / Treatments   Labs (all labs ordered are listed, but only abnormal results are displayed) Labs Reviewed  CBC WITH DIFFERENTIAL/PLATELET - Abnormal; Notable for the following components:      Result Value   Platelets 120 (*)    All other components within normal limits  COMPREHENSIVE METABOLIC PANEL WITH GFR  MAGNESIUM  BLOOD GAS, VENOUS  ETHANOL  SALICYLATE LEVEL  ACETAMINOPHEN  LEVEL  CK  TSH  TROPONIN I (HIGH SENSITIVITY)     EKG  My interpretation of EKG:  Sinus tachycardia rate 107 without any ST elevation or T wave inversions, normal intervals  RADIOLOGY I have reviewed the xray personally and interpreted without any evidence of pneumonia   PROCEDURES:  Critical Care performed: Yes, see critical care procedure note(s)  .1-3 Lead EKG Interpretation  Performed by: Ernest Ronal BRAVO, MD Authorized by: Ernest Ronal BRAVO, MD  Interpretation: abnormal     ECG rate:  120   ECG rate assessment: tachycardic     Rhythm: sinus tachycardia     Ectopy: none     Conduction: normal   .Critical Care  Performed by: Ernest Ronal BRAVO, MD Authorized by: Ernest Ronal BRAVO, MD   Critical care provider statement:    Critical care time (minutes):  30   Critical care was necessary to treat or prevent imminent or life-threatening deterioration of the following conditions: alcohol  withdraw.   Critical care was time spent personally by me on the following activities:  Development of treatment plan with patient or surrogate, discussions with consultants, evaluation of patient's response to treatment, examination of patient, ordering and review of laboratory studies, ordering and review of radiographic studies, ordering and performing treatments and interventions, pulse oximetry, re-evaluation of patient's condition and review of old charts    MEDICATIONS ORDERED IN ED: Medications  LORazepam  (ATIVAN ) tablet 1-4 mg (has  no administration in time range)    Or  LORazepam  (ATIVAN ) injection 1-4 mg (has no administration in time range)  thiamine  (VITAMIN B1) injection 100 mg (has no administration in time range)  folic acid  (FOLVITE ) tablet 1 mg (has no administration in time range)  multivitamin with minerals tablet 1 tablet (has no administration in time range)  midazolam PF (VERSED) injection 5 mg (has no administration in time range)  sodium chloride  0.9 % bolus 1,000 mL (has no administration in time range)  ondansetron  (ZOFRAN ) injection 4 mg (has no administration in time range)     IMPRESSION / MDM / ASSESSMENT AND PLAN / ED COURSE  I reviewed the triage vital signs and the nursing notes.   Patient's presentation is most consistent with acute presentation with potential threat to life or bodily function.   Differential includes dehydration, electrolyte abnormalities, AKI, alcohol  withdrawal.  Abdomen soft and nontender low suspicion for acute abdominal process.  Has been here previously for alcohol  withdrawal and was ended up discharging after treatments on Librium  course.  Given significant hypertension, tachycardia and tremors patient was given 5 of IV Versed.  7:03 PM patient did get little bit hypoxic after the Versed.  He is able to wake and open his eyes take some deep breaths with voice and light tactile sensation.  Patient was placed on 3 L of oxygen.  Will add on x-ray to ensure no aspiration pneumonia or other acute pathology.  8:12 PM reevaluated patient he is now awake again he is having some tremors still tachycardic we discussed given his significant dehydration with elevated anion gap and low bicarb in the setting of nausea, vomiting, alcohol  withdrawal patient would like admission to the hospital for alcohol  withdrawal given I do not feel he will tolerate outpatient treatment with Librium  given his significant nausea and vomiting.   The patient is on the cardiac monitor to evaluate for  evidence of arrhythmia and/or significant heart rate changes.      FINAL CLINICAL IMPRESSION(S) / ED DIAGNOSES   Final diagnoses:  Dehydration  Alcohol  withdrawal syndrome without complication (HCC)     Rx / DC Orders   ED Discharge Orders     None        Note:  This document was prepared using Dragon voice recognition software and may include unintentional dictation errors.   Ernest Ronal BRAVO, MD 11/29/23 2037

## 2023-11-30 ENCOUNTER — Encounter: Payer: Self-pay | Admitting: Internal Medicine

## 2023-11-30 DIAGNOSIS — I10 Essential (primary) hypertension: Secondary | ICD-10-CM | POA: Diagnosis present

## 2023-11-30 DIAGNOSIS — R000 Tachycardia, unspecified: Secondary | ICD-10-CM

## 2023-11-30 LAB — URINALYSIS, ROUTINE W REFLEX MICROSCOPIC
Bilirubin Urine: NEGATIVE
Glucose, UA: NEGATIVE mg/dL
Hgb urine dipstick: NEGATIVE
Ketones, ur: 20 mg/dL — AB
Leukocytes,Ua: NEGATIVE
Nitrite: NEGATIVE
Protein, ur: 100 mg/dL — AB
Specific Gravity, Urine: 1.02 (ref 1.005–1.030)
Squamous Epithelial / HPF: 0 /HPF (ref 0–5)
pH: 7 (ref 5.0–8.0)

## 2023-11-30 LAB — URINE DRUG SCREEN, QUALITATIVE (ARMC ONLY)
Amphetamines, Ur Screen: NOT DETECTED
Barbiturates, Ur Screen: POSITIVE — AB
Benzodiazepine, Ur Scrn: POSITIVE — AB
Cannabinoid 50 Ng, Ur ~~LOC~~: NOT DETECTED
Cocaine Metabolite,Ur ~~LOC~~: NOT DETECTED
MDMA (Ecstasy)Ur Screen: NOT DETECTED
Methadone Scn, Ur: NOT DETECTED
Opiate, Ur Screen: NOT DETECTED
Phencyclidine (PCP) Ur S: NOT DETECTED
Tricyclic, Ur Screen: NOT DETECTED

## 2023-11-30 LAB — COMPREHENSIVE METABOLIC PANEL WITH GFR
ALT: 35 U/L (ref 0–44)
AST: 37 U/L (ref 15–41)
Albumin: 4.1 g/dL (ref 3.5–5.0)
Alkaline Phosphatase: 41 U/L (ref 38–126)
Anion gap: 11 (ref 5–15)
BUN: 10 mg/dL (ref 6–20)
CO2: 22 mmol/L (ref 22–32)
Calcium: 8.3 mg/dL — ABNORMAL LOW (ref 8.9–10.3)
Chloride: 100 mmol/L (ref 98–111)
Creatinine, Ser: 0.74 mg/dL (ref 0.61–1.24)
GFR, Estimated: >60 mL/min (ref >60)
Glucose, Bld: 95 mg/dL (ref 70–99)
Potassium: 3.6 mmol/L (ref 3.5–5.1)
Sodium: 133 mmol/L — ABNORMAL LOW (ref 135–145)
Total Bilirubin: 1.3 mg/dL — ABNORMAL HIGH (ref 0.0–1.2)
Total Protein: 7.5 g/dL (ref 6.5–8.1)

## 2023-11-30 LAB — AMMONIA: Ammonia: 30 umol/L (ref 9–35)

## 2023-11-30 LAB — CBC
HCT: 39.2 % (ref 39.0–52.0)
Hemoglobin: 13.9 g/dL (ref 13.0–17.0)
MCH: 32.9 pg (ref 26.0–34.0)
MCHC: 35.5 g/dL (ref 30.0–36.0)
MCV: 92.7 fL (ref 80.0–100.0)
Platelets: 104 K/uL — ABNORMAL LOW (ref 150–400)
RBC: 4.23 MIL/uL (ref 4.22–5.81)
RDW: 13.4 % (ref 11.5–15.5)
WBC: 4.1 K/uL (ref 4.0–10.5)
nRBC: 0 % (ref 0.0–0.2)

## 2023-11-30 LAB — HIV ANTIBODY (ROUTINE TESTING W REFLEX): HIV Screen 4th Generation wRfx: NONREACTIVE

## 2023-11-30 LAB — MRSA NEXT GEN BY PCR, NASAL: MRSA by PCR Next Gen: NOT DETECTED

## 2023-11-30 LAB — GLUCOSE, CAPILLARY: Glucose-Capillary: 91 mg/dL (ref 70–99)

## 2023-11-30 LAB — T4, FREE: Free T4: 0.75 ng/dL (ref 0.61–1.12)

## 2023-11-30 MED ORDER — NICOTINE 21 MG/24HR TD PT24
21.0000 mg | MEDICATED_PATCH | Freq: Every day | TRANSDERMAL | Status: DC
  Administered 2023-11-30 – 2023-12-03 (×4): 21 mg via TRANSDERMAL
  Filled 2023-11-30 (×4): qty 1

## 2023-11-30 MED ORDER — CHLORHEXIDINE GLUCONATE CLOTH 2 % EX PADS
6.0000 | MEDICATED_PAD | Freq: Every day | CUTANEOUS | Status: DC
  Administered 2023-11-30 – 2023-12-02 (×3): 6 via TOPICAL

## 2023-11-30 MED ORDER — BUPROPION HCL 75 MG PO TABS
75.0000 mg | ORAL_TABLET | Freq: Two times a day (BID) | ORAL | Status: DC
  Administered 2023-11-30 – 2023-12-03 (×8): 75 mg via ORAL
  Filled 2023-11-30 (×8): qty 1

## 2023-11-30 MED ORDER — ORAL CARE MOUTH RINSE
15.0000 mL | OROMUCOSAL | Status: DC | PRN
Start: 2023-11-30 — End: 2023-12-03

## 2023-11-30 NOTE — Plan of Care (Signed)
  Problem: Education: Goal: Knowledge of General Education information will improve Description: Including pain rating scale, medication(s)/side effects and non-pharmacologic comfort measures Outcome: Progressing   Problem: Nutrition: Goal: Adequate nutrition will be maintained Outcome: Progressing   Problem: Elimination: Goal: Will not experience complications related to bowel motility Outcome: Progressing Goal: Will not experience complications related to urinary retention Outcome: Progressing   Problem: Pain Managment: Goal: General experience of comfort will improve and/or be controlled Outcome: Progressing   Problem: Safety: Goal: Ability to remain free from injury will improve Outcome: Progressing

## 2023-11-30 NOTE — Progress Notes (Addendum)
 PROGRESS NOTE    Bobby Williams  FMW:[id number] DOB: 06/11/1965 DOA: 11/29/2023 PCP: Britta King, MD  Chief Complaint  Patient presents with   Withdrawal    Alcohol  WD    Hospital Course:  Bobby Williams is a 58 y.o. male with medical history significant of hypertension, depression with anxiety, thrombocytopenia, alcohol  abuse, who presented with alcohol  withdrawal. Per patient and his son at the bedside, he drinks 3 bottles every night, last drink 11/01, after which he tried to stop drinking and went into withdrawal.  Has history of delirium tremens, denies alcohol  withdrawal seizures.  Patient admitted for alcohol  withdrawal.  Hospital course as below  Subjective: Patient was examined at the bedside, new to me today.  Continues to have tremors, tongue fasciculations, visual hallucinations. High scoring on CIWA, lethargy improving -able to have conversation Monitor closely due to high risk for DT's   Objective: Vitals:   11/30/23 0430 11/30/23 0500 11/30/23 0600 11/30/23 0800  BP: 125/74 (!) 160/97 (!) 166/92 (!) 161/90  Pulse: 99 99 (!) 105 95  Resp: 20 (!) 23 18 (!) 22  Temp:    98.6 F (37 C)  TempSrc:    Oral  SpO2: 97% 100% 100% 99%  Weight:      Height:        Intake/Output Summary (Last 24 hours) at 11/30/2023 0956 Last data filed at 11/30/2023 0800 Gross per 24 hour  Intake 2747.05 ml  Output 0 ml  Net 2747.05 ml   Filed Weights   11/29/23 1833 11/30/23 0001  Weight: 112.8 kg 119.2 kg    Examination: General: Not in acute distress. Patient is tremulous Cardiac: Tachycardic, regular, No murmurs, No gallops or rubs. Respiratory: No rales, wheezing, rhonchi or rubs. GI: Soft, nondistended, nontender Ext: No pitting leg edema bilaterally. 1+DP/PT pulse bilaterally. Skin: No rashes.  Neuro: Slow to respond, but able to have conversation.  AO x 3, no gross focal deficits Psych: Patient is not psychotic, having visual hallucinations  Assessment & Plan:   Alcohol  withdrawal - His delirium which is slowly improving, has visual hallucinations, severely tremulous with high risk of DTs. - No history of alcohol  withdrawal seizure. - Continue IV fluids due to poor po intake - On Phenobarbital taper - Monitor CIWA - FA, MV, Thiamine  - Fall precautions   Sinus tachycardia - Likely due to alcohol  withdrawal - Wean off Cardizem drip as able - Telemetry monitoring   Hypertension: - Continue amlodipine  - Hold Hygroton  since patient need IV fluid. - Patient is on Cardizem drip   AGMA likely due to alcohol  use - Resolved  Transaminitis - resolved - Likely due to alcohol  abuse - right upper quadrant ultrasound on 10/18/2020 which showed fatty liver.   Thrombocytopenia: Platelets are 120, chronic issue.  Likely due to alcohol  abuse - Follow-up by CBC   Depression with anxiety - Continue Wellbutrin and trazodone   Elevated TSH - FT4 pending   Obesity (BMI 30-39.9): Patient has Obesity Class I, with body weight 119.2 Kg and BMI 32.5 kg/m2.  - Encourage losing weight - Exercise and healthy diet  DVT prophylaxis: Lovenox  SQ   Code Status: Full Code Disposition:  TBD  Consultants:  None  Procedures:  None  Antimicrobials:  Anti-infectives (From admission, onward)    None       Data Reviewed: I have personally reviewed following labs and imaging studies CBC: Recent Labs  Lab 11/29/23 1825 11/30/23 0604  WBC 6.3 4.1  NEUTROABS 4.0  --  HGB 15.9 13.9  HCT 46.4 39.2  MCV 93.7 92.7  PLT 120* 104*   Basic Metabolic Panel: Recent Labs  Lab 11/29/23 1825 11/30/23 0604  NA 135 133*  K 3.7 3.6  CL 98 100  CO2 13* 22  GLUCOSE 131* 95  BUN 10 10  CREATININE 0.76 0.74  CALCIUM 9.0 8.3*  MG 2.1  --   PHOS 3.9  --    GFR: Estimated Creatinine Clearance: 141.8 mL/min (by C-G formula based on SCr of 0.74 mg/dL). Liver Function Tests: Recent Labs  Lab 11/29/23 1825 11/30/23 0604  AST 69* 37  ALT 46* 35  ALKPHOS  51 41  BILITOT 1.0 1.3*  PROT 8.6* 7.5  ALBUMIN 5.1* 4.1   CBG: Recent Labs  Lab 11/29/23 2349  GLUCAP 91    Recent Results (from the past 240 hours)  MRSA Next Gen by PCR, Nasal     Status: None   Collection Time: 11/30/23 12:12 AM   Specimen: Nasal Mucosa; Nasal Swab  Result Value Ref Range Status   MRSA by PCR Next Gen NOT DETECTED NOT DETECTED Final    Comment: (NOTE) The GeneXpert MRSA Assay (FDA approved for NASAL specimens only), is one component of a comprehensive MRSA colonization surveillance program. It is not intended to diagnose MRSA infection nor to guide or monitor treatment for MRSA infections. Test performance is not FDA approved in patients less than 27 years old. Performed at [facility], [address], [place], [place] [address]      Radiology Studies: DG Chest Portable 1 View Result Date: 11/29/2023 CLINICAL DATA:  Shortness of breath. EXAM: PORTABLE CHEST 1 VIEW COMPARISON:  11/25/2018. FINDINGS: The heart size and mediastinal contours are within normal limits. There is chronic elevation the right diaphragm. No consolidation, effusion, or pneumothorax is seen. No acute osseous abnormality. IMPRESSION: No active disease. Electronically Signed   By: Leita Birmingham M.D.   On: 11/29/2023 19:28    Scheduled Meds:  amLODipine   10 mg Oral Daily   buPROPion  75 mg Oral BID   Chlorhexidine Gluconate Cloth  6 each Topical Daily   enoxaparin  (LOVENOX ) injection  40 mg Subcutaneous Q24H   folic acid   1 mg Oral Daily   multivitamin with minerals  1 tablet Oral Daily   PHENObarbital  97.5 mg Intravenous Q8H   Followed by   NOREEN ON 12/02/2023] PHENObarbital  65 mg Intravenous Q8H   Followed by   NOREEN ON 12/04/2023] PHENObarbital  32.5 mg Intravenous Q8H   thiamine   100 mg Intravenous Daily   Continuous Infusions:  diltiazem (CARDIZEM) infusion 5 mg/hr (11/30/23 0600)   lactated ringers  75 mL/hr at 11/30/23 0600     LOS: 1 day  MDM: Patient is  high risk for one or more organ failure.  They necessitate ongoing hospitalization for continued IV therapies and subsequent lab monitoring. Total time spent interpreting labs and vitals, reviewing the medical record, coordinating care amongst consultants and care team members, directly assessing and discussing care with the patient and/or family: 55 min Laree Lock, MD [facility]  To contact the attending physician between 7A-7P please use Epic Chat. To contact the covering physician during after hours 7P-7A, please review Amion.  11/30/2023, 9:56 AM   *This document has been created with the assistance of dictation software. Please excuse typographical errors. *

## 2023-12-01 LAB — BASIC METABOLIC PANEL WITH GFR
Anion gap: 11 (ref 5–15)
BUN: 9 mg/dL (ref 6–20)
CO2: 24 mmol/L (ref 22–32)
Calcium: 8.7 mg/dL — ABNORMAL LOW (ref 8.9–10.3)
Chloride: 99 mmol/L (ref 98–111)
Creatinine, Ser: 0.72 mg/dL (ref 0.61–1.24)
GFR, Estimated: >60 mL/min (ref >60)
Glucose, Bld: 90 mg/dL (ref 70–99)
Potassium: 3.6 mmol/L (ref 3.5–5.1)
Sodium: 134 mmol/L — ABNORMAL LOW (ref 135–145)

## 2023-12-01 LAB — CBC
HCT: 39.5 % (ref 39.0–52.0)
Hemoglobin: 13.5 g/dL (ref 13.0–17.0)
MCH: 32 pg (ref 26.0–34.0)
MCHC: 34.2 g/dL (ref 30.0–36.0)
MCV: 93.6 fL (ref 80.0–100.0)
Platelets: 92 K/uL — ABNORMAL LOW (ref 150–400)
RBC: 4.22 MIL/uL (ref 4.22–5.81)
RDW: 13 % (ref 11.5–15.5)
WBC: 3.4 K/uL — ABNORMAL LOW (ref 4.0–10.5)
nRBC: 0 % (ref 0.0–0.2)

## 2023-12-01 LAB — GLUCOSE, CAPILLARY: Glucose-Capillary: 121 mg/dL — ABNORMAL HIGH (ref 70–99)

## 2023-12-01 LAB — PHOSPHORUS: Phosphorus: 3.7 mg/dL (ref 2.5–4.6)

## 2023-12-01 LAB — MAGNESIUM: Magnesium: 2.3 mg/dL (ref 1.7–2.4)

## 2023-12-01 MED ORDER — THIAMINE MONONITRATE 100 MG PO TABS
100.0000 mg | ORAL_TABLET | Freq: Every day | ORAL | Status: DC
  Administered 2023-12-02 – 2023-12-03 (×2): 100 mg via ORAL
  Filled 2023-12-01 (×2): qty 1

## 2023-12-01 NOTE — Progress Notes (Signed)
 PROGRESS NOTE    Bobby Williams  FMW:[id number] DOB: Mar 26, 1965 DOA: 11/29/2023 PCP: Britta King, MD  Chief Complaint  Patient presents with   Withdrawal    Alcohol  WD    Hospital Course:  Bobby Williams is a 58 y.o. male with medical history significant of hypertension, depression with anxiety, thrombocytopenia, alcohol  abuse, who presented with alcohol  withdrawal. Per patient and his son at the bedside, he drinks 3 bottles every night, last drink 11/01, after which he tried to stop drinking and went into withdrawal.  Has history of delirium tremens, denies alcohol  withdrawal seizures.  Patient admitted for alcohol  withdrawal.  Hospital course as below  Subjective: Patient was examined at the bedside, continues to have tremors, states he feels foggy in his head Hallucinations have resolved, lethargy resolved, improving Son present at the bedside, discussed updates and answered all questions with both patient and son   Objective: Vitals:   12/01/23 1300 12/01/23 1400 12/01/23 1500 12/01/23 1600  BP:   119/85   Pulse: 98 95 96 88  Resp: 17 (!) 22 (!) 30 12  Temp:   98.3 F (36.8 C)   TempSrc:   Oral   SpO2: 100% 100% 94% 100%  Weight:      Height:        Intake/Output Summary (Last 24 hours) at 12/01/2023 1704 Last data filed at 12/01/2023 1347 Gross per 24 hour  Intake 315.05 ml  Output 2615 ml  Net -2299.95 ml   Filed Weights   11/29/23 1833 11/30/23 0001  Weight: 112.8 kg 119.2 kg    Examination: General: Not in acute distress. Patient is tremulous Cardiac: Tachycardic, regular, No murmurs, No gallops or rubs. Respiratory: No rales, wheezing, rhonchi or rubs. GI: Soft, nondistended, nontender Ext: No pitting leg edema bilaterally. 1+DP/PT pulse bilaterally. Skin: No rashes.  Neuro: AO x 3, no gross focal deficits Psych: Patient is not psychotic, no hallucinations  Assessment & Plan:  Alcohol  withdrawal - Severely tremulous, lethargy and hallucinations  resolved - No history of alcohol  withdrawal seizure. - On Phenobarbital taper - Monitor CIWA - FA, MV, Thiamine  - Fall precautions   Sinus tachycardia - Likely due to alcohol  withdrawal - Weaned off Cardizem drip - Telemetry monitoring   Hypertension - Continue amlodipine  - Hold Hygroton    AGMA likely due to alcohol  use - Resolved  Transaminitis - resolved - Likely due to alcohol  abuse - right upper quadrant ultrasound on 10/18/2020 which showed fatty liver.   Thrombocytopenia: Platelets are 92, chronic issue.  Likely due to alcohol  abuse - Follow-up by CBC   Depression with anxiety - Continue Wellbutrin and trazodone   Subclinical hypothyroid - Follow-up outpatient with repeat labs in 6- 8 weeks   Obesity (BMI 30-39.9): Patient has Obesity Class I, with body weight 119.2 Kg and BMI 32.5 kg/m2.  - Encourage losing weight - Exercise and healthy diet  -- PT/OT eval  DVT prophylaxis: Lovenox  SQ   Code Status: Full Code Disposition:  TBD  Consultants:  None  Procedures:  None  Antimicrobials:  Anti-infectives (From admission, onward)    None       Data Reviewed: I have personally reviewed following labs and imaging studies CBC: Recent Labs  Lab 11/29/23 1825 11/30/23 0604 12/01/23 0641  WBC 6.3 4.1 3.4*  NEUTROABS 4.0  --   --   HGB 15.9 13.9 13.5  HCT 46.4 39.2 39.5  MCV 93.7 92.7 93.6  PLT 120* 104* 92*   Basic Metabolic Panel: Recent Labs  Lab 11/29/23 1825 11/30/23 0604 12/01/23 0641  NA 135 133* 134*  K 3.7 3.6 3.6  CL 98 100 99  CO2 13* 22 24  GLUCOSE 131* 95 90  BUN 10 10 9   CREATININE 0.76 0.74 0.72  CALCIUM 9.0 8.3* 8.7*  MG 2.1  --  2.3  PHOS 3.9  --  3.7   GFR: Estimated Creatinine Clearance: 141.8 mL/min (by C-G formula based on SCr of 0.72 mg/dL). Liver Function Tests: Recent Labs  Lab 11/29/23 1825 11/30/23 0604  AST 69* 37  ALT 46* 35  ALKPHOS 51 41  BILITOT 1.0 1.3*  PROT 8.6* 7.5  ALBUMIN 5.1* 4.1    CBG: Recent Labs  Lab 11/29/23 2349  GLUCAP 91    Recent Results (from the past 240 hours)  MRSA Next Gen by PCR, Nasal     Status: None   Collection Time: 11/30/23 12:12 AM   Specimen: Nasal Mucosa; Nasal Swab  Result Value Ref Range Status   MRSA by PCR Next Gen NOT DETECTED NOT DETECTED Final    Comment: (NOTE) The GeneXpert MRSA Assay (FDA approved for NASAL specimens only), is one component of a comprehensive MRSA colonization surveillance program. It is not intended to diagnose MRSA infection nor to guide or monitor treatment for MRSA infections. Test performance is not FDA approved in patients less than 25 years old. Performed at [facility], [address] [place], [place] [address]      Radiology Studies: DG Chest Portable 1 View Result Date: 11/29/2023 CLINICAL DATA:  Shortness of breath. EXAM: PORTABLE CHEST 1 VIEW COMPARISON:  11/25/2018. FINDINGS: The heart size and mediastinal contours are within normal limits. There is chronic elevation the right diaphragm. No consolidation, effusion, or pneumothorax is seen. No acute osseous abnormality. IMPRESSION: No active disease. Electronically Signed   By: Leita Birmingham M.D.   On: 11/29/2023 19:28    Scheduled Meds:  amLODipine   10 mg Oral Daily   buPROPion  75 mg Oral BID   Chlorhexidine Gluconate Cloth  6 each Topical Daily   enoxaparin  (LOVENOX ) injection  40 mg Subcutaneous Q24H   folic acid   1 mg Oral Daily   multivitamin with minerals  1 tablet Oral Daily   nicotine  21 mg Transdermal Daily   PHENObarbital  97.5 mg Intravenous Q8H   Followed by   NOREEN ON 12/02/2023] PHENObarbital  65 mg Intravenous Q8H   Followed by   NOREEN ON 12/04/2023] PHENObarbital  32.5 mg Intravenous Q8H   [START ON 12/02/2023] thiamine   100 mg Oral Daily   Continuous Infusions:     LOS: 2 days  MDM: Patient is high risk for one or more organ failure.  They necessitate ongoing hospitalization for continued IV therapies  and subsequent lab monitoring. Total time spent interpreting labs and vitals, reviewing the medical record, coordinating care amongst consultants and care team members, directly assessing and discussing care with the patient and/or family: 55 min Laree Lock, MD [facility]  To contact the attending physician between 7A-7P please use Epic Chat. To contact the covering physician during after hours 7P-7A, please review Amion.  12/01/2023, 5:04 PM   *This document has been created with the assistance of dictation software. Please excuse typographical errors. *

## 2023-12-01 NOTE — TOC Initial Note (Signed)
 Transition of Care [facility]) - Initial/Assessment Note    Patient Details  Name: Bobby Williams MRN: [id number] Date of Birth: 01/27/1966  Transition of Care [facility]) CM/SW Contact:    Corrie JINNY Ruts, LCSW Phone Number: 12/01/2023, 2:37 PM  Clinical Narrative:                 Chart reviewed. The patient was admitted for Alcohol  withdrawals. There was a consult for Substance use education. I was able to speak with the patient at bedside today. I introduced myself, my role, and reason for consult. The patient confirmed that he does not have a local PCP. I offered the patient PCP resources and the patient was accepting.   The patient reports that he lives by himself and his son comes on the weekend. The patient reports that he was able to complete daily living task independently and droves himself to medical appointments when he had a provider. The patient reports that he is separated from his wife but she will help him during D/C. The patient reports that he has never had HH or been admitted into a SNF in the past. The patient reports that he has no equipment in the home. I offered the patient substance use resources. The patient was interested in outpatient resources only. The patient was tearful. I have added PCP and SUD resources to patient AVS.   There are no other TOC needs at this time.     Barriers to Discharge: Continued Medical Work up   Patient Goals and CMS Choice            Expected Discharge Plan and Services       Living arrangements for the past 2 months: Single Family Home                                      Prior Living Arrangements/Services Living arrangements for the past 2 months: Single Family Home Lives with:: Self, Minor Children (patient reports that his son comes on the weekend.) Patient language and need for interpreter reviewed:: Yes        Need for Family Participation in Patient Care: Yes (Comment)     Criminal Activity/Legal Involvement  Pertinent to Current Situation/Hospitalization: No - Comment as needed  Activities of Daily Living      Permission Sought/Granted                  Emotional Assessment Appearance:: Appears stated age Attitude/Demeanor/Rapport: Gracious Affect (typically observed): Tearful/Crying, Sad Orientation: : Oriented to Self, Oriented to Place, Oriented to  Time, Oriented to Situation Alcohol  / Substance Use: Alcohol  Use    Admission diagnosis:  Alcohol  withdrawal (HCC) [F10.939] Dehydration [E86.0] Alcohol  withdrawal syndrome without complication (HCC) [F10.930] Patient Active Problem List   Diagnosis Date Noted   HTN (hypertension) 11/30/2023   Alcohol  withdrawal (HCC) 11/29/2023   Depression with anxiety 11/29/2023   Obesity (BMI 30-39.9) 11/29/2023   Thrombocytopenia 11/29/2023   Metabolic acidosis 11/29/2023   Abnormal liver function 11/29/2023   Sinus tachycardia 11/29/2023   Nicotine dependence 08/22/2020   Need for hepatitis C screening test 08/22/2020   Prostate cancer screening 08/22/2020   Need for Tdap vaccination 08/22/2020   Class 1 obesity due to excess calories with serious comorbidity and body mass index (BMI) of 34.0 to 34.9 in adult 08/22/2020   Anxiety 12/03/2018   Sepsis (HCC) 11/23/2018   Uncontrolled hypertension  11/23/2018   Alcohol  withdrawal syndrome without complication (HCC) 01/22/2016   Rib fracture 01/22/2016   CAP (community acquired pneumonia) 01/22/2016   PCP:  Britta King, MD Pharmacy:   [facility] [phone] [place], [place] - [address] [place] [address] Phone: [phone] Fax: [phone]  [id number] [place], [place] - [address] AT [facility] [address] [place] [address] Phone: [phone] Fax: [phone]     Social Drivers of Health (SDOH) Social History: SDOH Screenings   Food Insecurity: Unknown (02/24/2023)   Received from [facility]  Utilities: Unknown (02/24/2023)   Received from [facility]  Alcohol  Screen: Low Risk  (12/03/2018)  Depression (PHQ2-9): Low Risk  (07/08/2021)  Financial Resource Strain: Low Risk  (12/03/2018)  Physical Activity: Inactive (12/03/2018)  Social Connections: Moderately Integrated (12/03/2018)  Stress: No Stress Concern Present (12/03/2018)  Tobacco Use: Medium Risk (11/30/2023)   SDOH Interventions:     Readmission Risk Interventions     No data to display

## 2023-12-01 NOTE — Discharge Instructions (Signed)
 Some PCP options in [place] area- not a comprehensive list  [facility]- [phone] [place]- [phone] [facility]- [phone] [facility]- [phone] [facility]- [phone] Nichole Molly- [phone]  or [facility] Physician Referral Line [phone]   ------------------------------------------------------------------------------------------------------------------------------  Here's  a list of outpatient and outpatient-capable substance use treatment programs in and around [place], [place] -- formatted like a spreadsheet for easy comparison.  Outpatient Substance Use Treatment Providers - [place], [place] (and nearby)   1 [facility] [place], [place] [phone] Substance Abuse Intensive Outpatient (SAIOP) and Comprehensive Outpatient (SACOT) Yes Focused outpatient treatment; accepts most insurance and Medicaid.  2 [address] [facility] [address] [place], [place], [place] [address] [phone] SAIOP & SACOT (licensed) Yes Structured, state-licensed IOP program; day/evening sessions.  3 [facility] [address] [place], [place], [place] [address] [phone] Regular outpatient counseling; DWI assessments Yes Focus on relapse prevention and education. Limited opioid treatment.  4 [facility] - [place] & [place] [address], [place], [place] [address] [phone] Outpatient counseling, prevention, referral Yes Long-established nonprofit; part of regional ADS network.  5 [facility] [address] [place], [place], [place] [address] [phone] Outpatient detox, therapy, relapse prevention Yes Small local program with strong community ties.  6 [facility] - [facility] [address], [place], [place] [address] [phone] 24/7 Behavioral health urgent care, outpatient follow-up, referral Yes Can connect directly to outpatient or crisis stabilization.  7 [facility] [address] [place], [place], [place] [address] [phone] Harm reduction, peer support, referrals to treatment Free Syringe services, HIV testing, connection to SUD outpatient providers.  8 [facility] [address], [place], [place] [address] [phone] Outpatient SUD treatment, MAT (Suboxone, methadone), IOP Yes Regional nonprofit that serves [place] referrals.  [address] [facility] [address], [place], [place] [address] [phone] Intensive Outpatient, outpatient therapy, MAT Yes Accepts Medicaid; about 30 min from [place].  10 [facility] - [place] Various nearby sites [phone] SAIOP, MAT, counseling, group therapy Yes Serves [place] residents via regional offices; evidence-based outpatient.  [address] [facility] [place] [address] [place], [place], [place] [address] [phone] SAIOP, counseling, case management Yes State-licensed; offers both mental health and SUD outpatient programs.  12 [facility] [address], [place], [place] [address] [phone] Outpatient, IOP, family support Yes Regional provider; short drive from [place].   How to Use This List Call to confirm intake availability and whether they accept your insurance or Medicaid. Ask for Substance Abuse Intensive Outpatient (SAIOP) or Comprehensive Outpatient (SACOT) programs--those are structured, evidence-based outpatient levels defined by [place] DHHS. If you need medication-assisted treatment (MAT) for opioids or alcohol , verify that the clinic provides it. Transportation: Many accept county transportation assistance for [employer].

## 2023-12-01 NOTE — Plan of Care (Signed)
  Problem: Education: Goal: Knowledge of General Education information will improve Description: Including pain rating scale, medication(s)/side effects and non-pharmacologic comfort measures Outcome: Progressing   Problem: Clinical Measurements: Goal: Ability to maintain clinical measurements within normal limits will improve Outcome: Progressing Goal: Will remain free from infection Outcome: Progressing Goal: Diagnostic test results will improve Outcome: Progressing   Problem: Activity: Goal: Risk for activity intolerance will decrease Outcome: Progressing   Problem: Nutrition: Goal: Adequate nutrition will be maintained Outcome: Progressing   Problem: Coping: Goal: Level of anxiety will decrease Outcome: Progressing   Problem: Elimination: Goal: Will not experience complications related to bowel motility Outcome: Progressing Goal: Will not experience complications related to urinary retention Outcome: Progressing   Problem: Pain Managment: Goal: General experience of comfort will improve and/or be controlled Outcome: Progressing   Problem: Safety: Goal: Ability to remain free from injury will improve Outcome: Progressing

## 2023-12-01 NOTE — Plan of Care (Signed)

## 2023-12-02 DIAGNOSIS — F10931 Alcohol use, unspecified with withdrawal delirium: Secondary | ICD-10-CM

## 2023-12-02 DIAGNOSIS — R569 Unspecified convulsions: Secondary | ICD-10-CM

## 2023-12-02 LAB — CBC
HCT: 39.2 % (ref 39.0–52.0)
Hemoglobin: 13.6 g/dL (ref 13.0–17.0)
MCH: 32 pg (ref 26.0–34.0)
MCHC: 34.7 g/dL (ref 30.0–36.0)
MCV: 92.2 fL (ref 80.0–100.0)
Platelets: 82 K/uL — ABNORMAL LOW (ref 150–400)
RBC: 4.25 MIL/uL (ref 4.22–5.81)
RDW: 12.7 % (ref 11.5–15.5)
WBC: 3.7 K/uL — ABNORMAL LOW (ref 4.0–10.5)
nRBC: 0 % (ref 0.0–0.2)

## 2023-12-02 LAB — BASIC METABOLIC PANEL WITH GFR
Anion gap: 13 (ref 5–15)
BUN: 12 mg/dL (ref 6–20)
CO2: 22 mmol/L (ref 22–32)
Calcium: 8.9 mg/dL (ref 8.9–10.3)
Chloride: 98 mmol/L (ref 98–111)
Creatinine, Ser: 0.69 mg/dL (ref 0.61–1.24)
GFR, Estimated: >60 mL/min (ref >60)
Glucose, Bld: 99 mg/dL (ref 70–99)
Potassium: 3.7 mmol/L (ref 3.5–5.1)
Sodium: 133 mmol/L — ABNORMAL LOW (ref 135–145)

## 2023-12-02 LAB — PHOSPHORUS: Phosphorus: 4.4 mg/dL (ref 2.5–4.6)

## 2023-12-02 LAB — MAGNESIUM: Magnesium: 2.1 mg/dL (ref 1.7–2.4)

## 2023-12-02 MED ORDER — LORAZEPAM 1 MG PO TABS: 1.0000 mg | ORAL_TABLET | ORAL | Status: DC | PRN

## 2023-12-02 MED ORDER — LORAZEPAM 2 MG/ML IJ SOLN: 1.0000 mg | INTRAMUSCULAR | Status: DC | PRN

## 2023-12-02 NOTE — Evaluation (Signed)
 Physical Therapy Evaluation Patient Details Name: Bobby Williams MRN: [id number] DOB: May 05, 1965 Today's Date: 12/02/2023  History of Present Illness  Pt is a 58 y/o M admitted on 11/29/23 after presenting with alcohol  withdrawal. PMH: HTN, depression with anxiety, thrombocytopenia, alcohol  abuse  Clinical Impression  Pt seen for PT evaluation with pt agreeable. Pt reports prior to admission he was independent without AD, driving, recently lost his job. On this date, pt is able to ambulate around ICU x 2 without AD with CGA, slightly decreased balance. Pt reports he feels weak 2/2 lying in bed for days. Pt with BUE tremors but reports these are better compared to admission. Will continue to follow pt acutely to progress balance, gait with LRAD, & stair negotiation as able.        If plan is discharge home, recommend the following: A little help with walking and/or transfers;A little help with bathing/dressing/bathroom;Assistance with cooking/housework;Assist for transportation;Help with stairs or ramp for entrance   Can travel by private vehicle        Equipment Recommendations Other (comment) (TBD pending progress)  Recommendations for Other Services       Functional Status Assessment Patient has had a recent decline in their functional status and demonstrates the ability to make significant improvements in function in a reasonable and predictable amount of time.     Precautions / Restrictions Precautions Precautions: Fall Restrictions Weight Bearing Restrictions Per Provider Order: No      Mobility  Bed Mobility Overal bed mobility: Needs Assistance Bed Mobility: Supine to Sit     Supine to sit: Supervision, HOB elevated, Used rails (exit R side of bed)          Transfers Overall transfer level: Needs assistance Equipment used: None Transfers: Sit to/from Stand, Bed to chair/wheelchair/BSC Sit to Stand: Min assist   Step pivot transfers: Min assist (bed>recliner  without AD)       General transfer comment: sit>stand from EOB without AD    Ambulation/Gait Ambulation/Gait assistance: Contact guard assist Gait Distance (Feet): 250 Feet (2 laps around ICU) Assistive device: None Gait Pattern/deviations: Decreased step length - right, Decreased step length - left, Decreased stride length          Stairs            Wheelchair Mobility     Tilt Bed    Modified Rankin (Stroke Patients Only)       Balance Overall balance assessment: Needs assistance Sitting-balance support: Feet supported Sitting balance-Leahy Scale: Good     Standing balance support: During functional activity, No upper extremity supported Standing balance-Leahy Scale: Fair                               Pertinent Vitals/Pain Pain Assessment Pain Assessment: No/denies pain    Home Living Family/patient expects to be discharged to:: Private residence Living Arrangements: Alone Available Help at Discharge: Family (could stay with ex wife at d/c if need be) Type of Home: House (duplex) Home Access: Stairs to enter Entrance Stairs-Rails: Left;Right;Can reach both Entrance Stairs-Number of Steps: 4   Home Layout: One level        Prior Function Prior Level of Function : Driving             Mobility Comments: Ambulatory without AD, driving, recently working (fired from car dealership ~3 weeks ago) ADLs Comments: independent without AD     Extremity/Trunk Assessment   Upper  Extremity Assessment Upper Extremity Assessment: Overall WFL for tasks assessed (BUE shaking but reports it's better than prior to admission)    Lower Extremity Assessment Lower Extremity Assessment: Generalized weakness    Cervical / Trunk Assessment Cervical / Trunk Assessment: Normal  Communication   Communication Communication: No apparent difficulties    Cognition Arousal: Alert Behavior During Therapy: WFL for tasks assessed/performed, Anxious    PT - Cognitive impairments: No apparent impairments                         Following commands: Intact       Cueing Cueing Techniques: Verbal cues     General Comments General comments (skin integrity, edema, etc.): RR up to 50, decreases to 21 with standing break, education re: pursed lip breathing    Exercises     Assessment/Plan    PT Assessment Patient needs continued PT services  PT Problem List Decreased strength;Decreased coordination;Decreased knowledge of use of DME;Decreased balance;Decreased activity tolerance;Decreased mobility;Decreased safety awareness;Decreased cognition       PT Treatment Interventions DME instruction;Balance training;Neuromuscular re-education;Gait training;Stair training;Cognitive remediation;Functional mobility training;Patient/family education;Therapeutic activities;Therapeutic exercise;Manual techniques    PT Goals (Current goals can be found in the Care Plan section)  Acute Rehab PT Goals Patient Stated Goal: get better PT Goal Formulation: With patient Time For Goal Achievement: 12/16/23 Potential to Achieve Goals: Good    Frequency Min 3X/week     Co-evaluation PT/OT/SLP Co-Evaluation/Treatment: Yes Reason for Co-Treatment: Complexity of the patient's impairments (multi-system involvement);To address functional/ADL transfers PT goals addressed during session: Mobility/safety with mobility;Balance         AM-PAC PT 6 Clicks Mobility  Outcome Measure Help needed turning from your back to your side while in a flat bed without using bedrails?: None Help needed moving from lying on your back to sitting on the side of a flat bed without using bedrails?: A Little Help needed moving to and from a bed to a chair (including a wheelchair)?: A Little Help needed standing up from a chair using your arms (e.g., wheelchair or bedside chair)?: A Little Help needed to walk in hospital room?: A Little Help needed climbing 3-5  steps with a railing? : A Little 6 Click Score: 19    End of Session   Activity Tolerance: Patient tolerated treatment well Patient left: in chair;with chair alarm set;with call bell/phone within reach;with family/visitor present   PT Visit Diagnosis: Unsteadiness on feet (R26.81);Other abnormalities of gait and mobility (R26.89);Muscle weakness (generalized) (M62.81)    Time: 9064-9041 PT Time Calculation (min) (ACUTE ONLY): 23 min   Charges:   PT Evaluation $PT Eval Moderate Complexity: 1 Mod   PT General Charges $$ ACUTE PT VISIT: 1 Visit         Richerd Pinal, PT, DPT 12/02/23, 10:45 AM   Richerd CHRISTELLA Pinal 12/02/2023, 10:44 AM

## 2023-12-02 NOTE — Progress Notes (Signed)
 Mobility Specialist - Progress Note   12/02/23 1700  Mobility  Activity Ambulated with assistance;Respositioned in chair  Level of Assistance Independent after set-up  Assistive Device None  Distance Ambulated (ft) 360 ft  Range of Motion/Exercises All extremities  Activity Response Tolerated well  Mobility visit 1 Mobility  Mobility Specialist Start Time (ACUTE ONLY) 1647  Mobility Specialist Stop Time (ACUTE ONLY) 1658  Mobility Specialist Time Calculation (min) (ACUTE ONLY) 11 min   Pt was in recliner on RA upon entry. Pt agreed to mobility. Pt is able today to STS independently with no AD. Pt ambulated well. Pt has some balance challenges and gitter's. Pt states it from inactivity. After activity pt returned to the room in recliner with needs in reach.  Clem Rodes Mobility Specialist 12/02/23, 5:50 PM

## 2023-12-02 NOTE — Evaluation (Signed)
 Occupational Therapy Evaluation Patient Details Name: Bobby Williams MRN: [id number] DOB: 1965-04-30 Today's Date: 12/02/2023   History of Present Illness   Pt is a 58 y/o M admitted on 11/29/23 after presenting with alcohol  withdrawal. PMH: HTN, depression with anxiety, thrombocytopenia, alcohol  abuse     Clinical Impressions Patient presenting with decreased Ind in self care,balance, functional mobility, transfers, endurance, and safety awareness.  Patient reports being Ind at baseline. Pt with recent stressors of being fired from job and he and wife separating leading to constant binge drinking for several weeks leading to hospital admission. Pt does endorse being heavy drinker prior to this but still being about to function. Pt does report brain fog with slow progressing and mild B hand tremor present. Pt stands and ambulates with min A without use of AD 500'. RR does increase to 55 at one point and pt needing to stop with cues for breathing techniques. Pt want's to return home with outpt rehab program but is able to go to his ex-wife's home if needed at discharge for support.  Patient will benefit from acute OT to increase overall independence in the areas of ADLs, functional mobility, and safety awareness in order to safely discharge.     If plan is discharge home, recommend the following:   A little help with walking and/or transfers;A little help with bathing/dressing/bathroom;Assistance with cooking/housework;Assist for transportation;Help with stairs or ramp for entrance     Functional Status Assessment   Patient has had a recent decline in their functional status and demonstrates the ability to make significant improvements in function in a reasonable and predictable amount of time.     Equipment Recommendations   None recommended by OT      Precautions/Restrictions   Precautions Precautions: Fall     Mobility Bed Mobility Overal bed mobility: Needs  Assistance Bed Mobility: Supine to Sit     Supine to sit: Supervision, HOB elevated, Used rails          Transfers Overall transfer level: Needs assistance Equipment used: None Transfers: Sit to/from Stand, Bed to chair/wheelchair/BSC Sit to Stand: Min assist                  Balance Overall balance assessment: Needs assistance Sitting-balance support: Feet supported Sitting balance-Leahy Scale: Good     Standing balance support: During functional activity, No upper extremity supported Standing balance-Leahy Scale: Fair                             ADL either performed or assessed with clinical judgement   ADL Overall ADL's : Needs assistance/impaired                         Toilet Transfer: Minimal assistance Toilet Transfer Details (indicate cue type and reason): simulated                 Vision Patient Visual Report: No change from baseline           Extremity/Trunk Assessment Upper Extremity Assessment Upper Extremity Assessment:  (B hand tremors)   Lower Extremity Assessment Lower Extremity Assessment: Generalized weakness   Cervical / Trunk Assessment Cervical / Trunk Assessment: Normal   Communication Communication Communication: No apparent difficulties   Cognition Arousal: Alert Behavior During Therapy: WFL for tasks assessed/performed, Anxious Cognition: Cognition impaired         Attention impairment (select first level of impairment):  Alternating attention   OT - Cognition Comments: slow processing                 Following commands: Intact       Cueing  General Comments   Cueing Techniques: Verbal cues  RR up to 50, decreases to 21 with standing break, education re: pursed lip breathing           Home Living Family/patient expects to be discharged to:: Private residence Living Arrangements: Alone Available Help at Discharge: Family;Other (Comment) (can stay with ex wife at d/c if  needed) Type of Home: House (duplex) Home Access: Stairs to enter Entrance Stairs-Number of Steps: 4 Entrance Stairs-Rails: Left;Right;Can reach both Home Layout: One level     Bathroom Shower/Tub: Tub/shower unit                    Prior Functioning/Environment Prior Level of Function : Driving             Mobility Comments: Ambulatory without AD, driving, recently working (fired from [employer] ~3 weeks ago) ADLs Comments: independent without AD    OT Problem List: Decreased strength;Decreased activity tolerance;Impaired balance (sitting and/or standing);Decreased coordination;Decreased safety awareness;Decreased knowledge of precautions;Cardiopulmonary status limiting activity   OT Treatment/Interventions: Self-care/ADL training;Therapeutic activities;Therapeutic exercise;Neuromuscular education;Energy conservation;Patient/family education;Balance training;DME and/or AE instruction      OT Goals(Current goals can be found in the care plan section)   Acute Rehab OT Goals Patient Stated Goal: to go home and stop drinking OT Goal Formulation: With patient Time For Goal Achievement: 12/16/23 Potential to Achieve Goals: Fair ADL Goals Pt Will Perform Grooming: with modified independence;standing Pt Will Perform Lower Body Dressing: with modified independence;sit to/from stand Pt Will Transfer to Toilet: with modified independence;ambulating Pt Will Perform Toileting - Clothing Manipulation and hygiene: with modified independence;sit to/from stand   OT Frequency:  Min 2X/week    Co-evaluation PT/OT/SLP Co-Evaluation/Treatment: Yes Reason for Co-Treatment: Complexity of the patient's impairments (multi-system involvement);To address functional/ADL transfers PT goals addressed during session: Mobility/safety with mobility;Balance OT goals addressed during session: ADL's and self-care      AM-PAC OT 6 Clicks Daily Activity     Outcome Measure Help from  another person eating meals?: None Help from another person taking care of personal grooming?: None Help from another person toileting, which includes using toliet, bedpan, or urinal?: A Little Help from another person bathing (including washing, rinsing, drying)?: A Little Help from another person to put on and taking off regular upper body clothing?: None Help from another person to put on and taking off regular lower body clothing?: A Little 6 Click Score: 21   End of Session Nurse Communication: Mobility status  Activity Tolerance: Patient tolerated treatment well Patient left: with call bell/phone within reach;in chair;with chair alarm set;with family/visitor present  OT Visit Diagnosis: Unsteadiness on feet (R26.81);Muscle weakness (generalized) (M62.81)                Time: 9065-9041 OT Time Calculation (min): 24 min Charges:  OT General Charges $OT Visit: 1 Visit OT Evaluation $OT Eval Low Complexity: 1 Low  Izetta Claude, MS, OTR/L , CBIS ascom [phone]  12/02/23, 2:16 PM

## 2023-12-02 NOTE — Plan of Care (Signed)

## 2023-12-02 NOTE — Progress Notes (Signed)
 Progress Note   Patient: Bobby Williams FMW:[id number] DOB: 08/29/1965 DOA: 11/29/2023     3 DOS: the patient was seen and examined on 12/02/2023   Brief hospital course: QUADIR MUNS is a 58 y.o. male with medical history significant of hypertension, depression with anxiety, thrombocytopenia, alcohol  abuse, who presented with alcohol  withdrawal. Per patient and his son at the bedside, he drinks 3 bottles every night, last drink 11/01, after which he tried to stop drinking and went into withdrawal.  Has history of delirium tremens, denies alcohol  withdrawal seizures.  Patient admitted for alcohol  withdrawal.  Hospital course as below    Assessment and Plan: Alcohol  withdrawal - Severely tremulous, lethargy and hallucinations resolved - No history of alcohol  withdrawal seizure. - Continue on Phenobarbital taper - Monitor CIWA - FA, MV, Thiamine  - Continue fall precautions   Sinus tachycardia - Likely due to alcohol  withdrawal - Weaned off Cardizem drip - Telemetry monitoring   Hypertension - Continue amlodipine     AGMA likely due to alcohol  use - Resolved   Transaminitis - resolved - Likely due to alcohol  abuse - right upper quadrant ultrasound on 10/18/2020 which showed fatty liver.   Thrombocytopenia: Platelets are 92, chronic issue.  Likely due to alcohol  abuse - Follow-up by CBC   Depression with anxiety - Continue Wellbutrin and trazodone    Subclinical hypothyroid - Follow-up outpatient with repeat labs in 6- 8 weeks   Obesity (BMI 30-39.9): Patient has Obesity Class I, with body weight 119.2 Kg and BMI 32.5 kg/m2.  - Encourage losing weight - Exercise and healthy diet   -- PT/OT eval   DVT prophylaxis: Lovenox  SQ  Subjective:  Patient seen and examined at bedside this morning Did have some tremors however improving Denies nausea vomiting abdominal pain chest pain Patient being transferred from the ICU today  Physical Exam: General: Not in acute distress.  Patient is tremulous Cardiac: Tachycardic, regular, No murmurs, No gallops or rubs. Respiratory: No rales, wheezing, rhonchi or rubs. GI: Soft, nondistended, nontender Ext: No pitting leg edema bilaterally. 1+DP/PT pulse bilaterally. Skin: No rashes.  Neuro: AO x 3, no gross focal deficits Psych: Patient is not psychotic, no hallucinations  Data Reviewed:    Latest Ref Rng & Units 12/02/2023    3:29 AM 12/01/2023    6:41 AM 11/30/2023    6:04 AM  CBC  WBC 4.0 - 10.5 K/uL 3.7  3.4  4.1   Hemoglobin 13.0 - 17.0 g/dL 86.3  86.4  86.0   Hematocrit 39.0 - 52.0 % 39.2  39.5  39.2   Platelets 150 - 400 K/uL 82  92  104        Latest Ref Rng & Units 12/02/2023    3:29 AM 12/01/2023    6:41 AM 11/30/2023    6:04 AM  BMP  Glucose 70 - 99 mg/dL 99  90  95   BUN 6 - 20 mg/dL 12  9  10    Creatinine 0.61 - 1.24 mg/dL 9.30  9.27  9.25   Sodium 135 - 145 mmol/L 133  134  133   Potassium 3.5 - 5.1 mmol/L 3.7  3.6  3.6   Chloride 98 - 111 mmol/L 98  99  100   CO2 22 - 32 mmol/L 22  24  22    Calcium 8.9 - 10.3 mg/dL 8.9  8.7  8.3      Vitals:   12/02/23 1000 12/02/23 1100 12/02/23 1258 12/02/23 1629  BP: (!) 144/99 134/89 ROLLEN)  152/102 132/77  Pulse:   95 (!) 104  Resp: 18  18 18   Temp:   98.2 F (36.8 C) 98.2 F (36.8 C)  TempSrc:   Oral   SpO2:   99% 97%  Weight:      Height:        Author: Drue ONEIDA Potter, MD 12/02/2023 5:18 PM  For on call review [email or web address].

## 2023-12-03 MED ORDER — LORATADINE 10 MG PO TABS
10.0000 mg | ORAL_TABLET | Freq: Every day | ORAL | Status: DC
  Administered 2023-12-03: 10 mg via ORAL
  Filled 2023-12-03: qty 1

## 2023-12-03 MED ORDER — FOLIC ACID 1 MG PO TABS: 1.0000 mg | ORAL_TABLET | Freq: Every day | ORAL | 1 refills | Status: AC

## 2023-12-03 MED ORDER — VITAMIN B-1 100 MG PO TABS: 100.0000 mg | ORAL_TABLET | Freq: Every day | ORAL | 1 refills | Status: AC

## 2023-12-03 MED ORDER — ADULT MULTIVITAMIN W/MINERALS CH: 1.0000 | ORAL_TABLET | Freq: Every day | ORAL | 1 refills | Status: AC

## 2023-12-03 MED ORDER — BUPROPION HCL 75 MG PO TABS: 75.0000 mg | ORAL_TABLET | Freq: Two times a day (BID) | ORAL | 1 refills | Status: AC

## 2023-12-03 MED ORDER — POLYVINYL ALCOHOL 1.4 % OP SOLN
2.0000 [drp] | OPHTHALMIC | Status: DC | PRN
  Filled 2023-12-03: qty 15

## 2023-12-03 NOTE — Progress Notes (Signed)
 Occupational Therapy Treatment Patient Details Name: Bobby Williams MRN: [id number] DOB: [birth year] Today's Date: 12/03/2023   History of present illness Pt is a 58 y/o M admitted on 11/29/23 after presenting with alcohol  withdrawal. PMH: HTN, depression with anxiety, thrombocytopenia, alcohol  abuse   OT comments  Pt is supine in bed on arrival. Pleasant and agreeable to OT session. He denies pain. Pt performed bed mobility MOD I. LB dressing with supervision to donn pants. Ambulated 2 laps around the nursing station without AD with SBA/SUP as well as stair training to ascend/descend 7-8 steps with SUP and cues for pacing. Edu pt on task modification, falls prevention, and pacing/rest breaks to maximize his safety and IND upon DC home. He verbalized understanding. Pt returned to bed with all needs in place and will cont to require skilled acute OT services to maximize his safety and IND to return to PLOF.       If plan is discharge home, recommend the following:  Assistance with cooking/housework;Assist for transportation;Help with stairs or ramp for entrance   Equipment Recommendations  None recommended by OT    Recommendations for Other Services      Precautions / Restrictions Precautions Precautions: Fall Recall of Precautions/Restrictions: Intact Restrictions Weight Bearing Restrictions Per Provider Order: No       Mobility Bed Mobility Overal bed mobility: Modified Independent                  Transfers Overall transfer level: Modified independent Equipment used: None Transfers: Sit to/from Stand Sit to Stand: Supervision           General transfer comment: supervision to stand from EOB and ambulate x2 laps around nursing station and perform stair training to ascend/descend 7-8 steps     Balance Overall balance assessment: Needs assistance Sitting-balance support: Feet supported Sitting balance-Leahy Scale: Normal     Standing balance support: During  functional activity, No upper extremity supported Standing balance-Leahy Scale: Good Standing balance comment: no LOB, occasional reaching for counter in hallway                           ADL either performed or assessed with clinical judgement   ADL Overall ADL's : Needs assistance/impaired                     Lower Body Dressing: Supervision/safety;Sitting/lateral leans;Sit to/from stand Lower Body Dressing Details (indicate cue type and reason): to donn long pants and fasten buckle             Functional mobility during ADLs: Supervision/safety      Extremity/Trunk Assessment              Vision       Perception     Praxis     Communication Communication Communication: No apparent difficulties   Cognition Arousal: Alert Behavior During Therapy: WFL for tasks assessed/performed, Impulsive, Anxious Cognition: Cognition impaired         Attention impairment (select first level of impairment): Alternating attention                     Following commands: Intact        Cueing   Cueing Techniques: Verbal cues  Exercises Other Exercises Other Exercises: Edu on pacing, rest breaks, sitting to modify tasks for fall prevention. Pt verbalized understanding.    Shoulder Instructions       General Comments  Pertinent Vitals/ Pain       Pain Assessment Pain Assessment: No/denies pain  Home Living                                          Prior Functioning/Environment              Frequency  Min 2X/week        Progress Toward Goals  OT Goals(current goals can now be found in the care plan section)  Progress towards OT goals: Progressing toward goals  Acute Rehab OT Goals Patient Stated Goal: go home OT Goal Formulation: With patient Time For Goal Achievement: 12/16/23 Potential to Achieve Goals: Good  Plan      Co-evaluation                 AM-PAC OT 6 Clicks Daily Activity      Outcome Measure   Help from another person eating meals?: None Help from another person taking care of personal grooming?: None Help from another person toileting, which includes using toliet, bedpan, or urinal?: None Help from another person bathing (including washing, rinsing, drying)?: None Help from another person to put on and taking off regular upper body clothing?: None Help from another person to put on and taking off regular lower body clothing?: None 6 Click Score: 24    End of Session Equipment Utilized During Treatment: Gait belt  OT Visit Diagnosis: Unsteadiness on feet (R26.81);Muscle weakness (generalized) (M62.81)   Activity Tolerance Patient tolerated treatment well   Patient Left with call bell/phone within reach;with chair alarm set;in bed   Nurse Communication Mobility status        Time: 8794-8773 OT Time Calculation (min): 21 min  Charges: OT General Charges $OT Visit: 1 Visit OT Treatments $Self Care/Home Management : 8-22 mins  Baker Moronta, OTR/L  12/03/23, 1:28 PM   Daisia Slomski E Jovi Zavadil 12/03/2023, 1:23 PM

## 2023-12-03 NOTE — Care Plan (Addendum)
 Discharge instructions given to patient at bedside IV removed Pt took all belongings with him  All questions answered No other concerns at this time    1:13 pt walked outside where he met the taxi driver

## 2023-12-03 NOTE — Discharge Summary (Signed)
 Physician Discharge Summary   Patient: Bobby Williams MRN: [id number] DOB: 30-Aug-1965  Admit date:     11/29/2023  Discharge date: 12/03/23  Discharge Physician: Drue ONEIDA Potter   PCP: Britta King, MD   Recommendations at discharge:  Follow-up with PCP  Discharge Diagnoses: Principal Problem:   Alcohol  withdrawal (HCC) Active Problems:   Sinus tachycardia   HTN (hypertension)   Metabolic acidosis   Abnormal liver function   Thrombocytopenia   Depression with anxiety   Obesity (BMI 30-39.9)  Resolved Problems:   * No resolved hospital problems. *  Hospital Course: Bobby Williams is a 58 y.o. male with medical history significant of hypertension, depression with anxiety, thrombocytopenia, alcohol  abuse, who presented with alcohol  withdrawal. Per patient and his son at the bedside, he drinks 3 bottles every night, last drink 11/01, after which he tried to stop drinking and went into withdrawal.  Has history of delirium tremens, denies alcohol  withdrawal seizures.  Patient admitted for alcohol  withdrawal.  Hospital course As outlined below     Assessment and Plan: Alcohol  withdrawal - Severely tremulous, lethargy and hallucinations resolved - No history of alcohol  withdrawal seizure. - Patient improved on Ativan  as well as phenobarb taper Seen by PT OT with recommendation for outpatient therapy   Sinus tachycardia - Likely due to alcohol  withdrawal - Weaned off Cardizem drip - Telemetry monitoring   Hypertension - Continue amlodipine      AGMA likely due to alcohol  use - Resolved   Transaminitis - resolved - Likely due to alcohol  abuse - right upper quadrant ultrasound on 10/18/2020 which showed fatty liver.   Thrombocytopenia: Platelets are 92, chronic issue.  Likely due to alcohol  abuse Outpatient CBC   Depression with anxiety - Continue Wellbutrin and trazodone    Subclinical hypothyroid - Follow-up outpatient with repeat labs in 6- 8 weeks   Obesity (BMI  30-39.9): Patient has Obesity Class I, with body weight 119.2 Kg and BMI 32.5 kg/m2.  - Encourage losing weight - Exercise and healthy diet   Diet recommendation:  Cardiac and Carb modified diet DISCHARGE MEDICATION: Allergies as of 12/03/2023   No Known Allergies      Medication List     TAKE these medications    amLODipine  10 MG tablet Commonly known as: NORVASC  1 TABLET DAILY AT 8:00 AM   buPROPion 75 MG tablet Commonly known as: WELLBUTRIN Take 1 tablet (75 mg total) by mouth 2 (two) times daily.   chlorthalidone  25 MG tablet Commonly known as: HYGROTON  TAKE 1 TABLET (25 MG TOTAL) BY MOUTH DAILY.   folic acid  1 MG tablet Commonly known as: FOLVITE  Take 1 tablet (1 mg total) by mouth daily. Start taking on: December 04, 2023   multivitamin with minerals Tabs tablet Take 1 tablet by mouth daily. Start taking on: December 04, 2023   thiamine  100 MG tablet Commonly known as: Vitamin B-1 Take 1 tablet (100 mg total) by mouth daily. Start taking on: December 04, 2023   traZODone  100 MG tablet Commonly known as: DESYREL  TAKE 1 TABLET BY MOUTH EVERYDAY AT BEDTIME        Discharge Exam: Filed Weights   11/29/23 1833 11/30/23 0001  Weight: 112.8 kg 119.2 kg   General: Not in acute distress.  Tremors improved Cardiac: Tachycardic, regular, No murmurs, No gallops or rubs. Respiratory: No rales, wheezing, rhonchi or rubs. GI: Soft, nondistended, nontender Ext: No pitting leg edema bilaterally. 1+DP/PT pulse bilaterally. Skin: No rashes.  Neuro: AO x 3, no  gross focal deficits Psych: Patient is not psychotic, no hallucinations  Condition at discharge: good  The results of significant diagnostics from this hospitalization (including imaging, microbiology, ancillary and laboratory) are listed below for reference.   Imaging Studies: DG Chest Portable 1 View Result Date: 11/29/2023 CLINICAL DATA:  Shortness of breath. EXAM: PORTABLE CHEST 1 VIEW COMPARISON:   11/25/2018. FINDINGS: The heart size and mediastinal contours are within normal limits. There is chronic elevation the right diaphragm. No consolidation, effusion, or pneumothorax is seen. No acute osseous abnormality. IMPRESSION: No active disease. Electronically Signed   By: Leita Birmingham M.D.   On: 11/29/2023 19:28    Microbiology: Results for orders placed or performed during the hospital encounter of 11/29/23  MRSA Next Gen by PCR, Nasal     Status: None   Collection Time: 11/30/23 12:12 AM   Specimen: Nasal Mucosa; Nasal Swab  Result Value Ref Range Status   MRSA by PCR Next Gen NOT DETECTED NOT DETECTED Final    Comment: (NOTE) The GeneXpert MRSA Assay (FDA approved for NASAL specimens only), is one component of a comprehensive MRSA colonization surveillance program. It is not intended to diagnose MRSA infection nor to guide or monitor treatment for MRSA infections. Test performance is not FDA approved in patients less than 53 years old. Performed at [facility], [address] [place], [place] [address]     Labs: CBC: Recent Labs  Lab 11/29/23 1825 11/30/23 0604 12/01/23 0641 12/02/23 0329  WBC 6.3 4.1 3.4* 3.7*  NEUTROABS 4.0  --   --   --   HGB 15.9 13.9 13.5 13.6  HCT 46.4 39.2 39.5 39.2  MCV 93.7 92.7 93.6 92.2  PLT 120* 104* 92* 82*   Basic Metabolic Panel: Recent Labs  Lab 11/29/23 1825 11/30/23 0604 12/01/23 0641 12/02/23 0329  NA 135 133* 134* 133*  K 3.7 3.6 3.6 3.7  CL 98 100 99 98  CO2 13* 22 24 22   GLUCOSE 131* 95 90 99  BUN 10 10 9 12   CREATININE 0.76 0.74 0.72 0.69  CALCIUM 9.0 8.3* 8.7* 8.9  MG 2.1  --  2.3 2.1  PHOS 3.9  --  3.7 4.4   Liver Function Tests: Recent Labs  Lab 11/29/23 1825 11/30/23 0604  AST 69* 37  ALT 46* 35  ALKPHOS 51 41  BILITOT 1.0 1.3*  PROT 8.6* 7.5  ALBUMIN 5.1* 4.1   CBG: Recent Labs  Lab 11/29/23 2349 12/01/23 1934  GLUCAP 91 121*    Discharge time spent:  37  minutes.  Signed: Drue ONEIDA Potter, MD [facility] 12/03/2023

## 2023-12-03 NOTE — TOC CM/SW Note (Signed)
 Transition of Care (TOC) CM/SW Note   .Occupational Therapy * Physical Therapy * Speech Therapy  DATE 12/03/2023 PATIENT NAME Bobby Williams PATIENT MRN  [id number]   DIAGNOSIS/DIAGNOSIS CODE F10.939  DATE OF DISCHARGE 12/03/2023  PRIMARY CARE PHYSICIAN Sheralyn Flock  PCP PHONE/FAX [phone]   Dear Provider    I certify that I have examined this patient and that occupational/physical/speech therapy is necessary on an outpatient basis.    The patient has expressed interest in completing their recommended course of therapy at your location.  Once a formal order from the patient's primary care physician has been obtained, please contact him/her to schedule an appointment for evaluation at your earliest convenience.  [ x ]  Physical Therapy Evaluate and Treat  [  x]  Occupational Therapy Evaluate and Treat  [  ]  Speech Therapy Evaluate and Treat  The patient's primary care physician (listed above) must furnish and be responsible for a formal order such that the recommended services may be furnished while under the primary physician's care, and that the plan of care will be established and reviewed every 30 days (or more often if condition necessitates).

## 2023-12-03 NOTE — Plan of Care (Signed)
  Problem: Health Behavior/Discharge Planning: Goal: Ability to manage health-related needs will improve Outcome: Progressing   Problem: Clinical Measurements: Goal: Will remain free from infection Outcome: Progressing   Problem: Clinical Measurements: Goal: Ability to maintain clinical measurements within normal limits will improve Outcome: Progressing   Problem: Clinical Measurements: Goal: Respiratory complications will improve Outcome: Progressing   Problem: Activity: Goal: Risk for activity intolerance will decrease Outcome: Progressing   Problem: Safety: Goal: Ability to remain free from injury will improve Outcome: Progressing   Problem: Skin Integrity: Goal: Risk for impaired skin integrity will decrease Outcome: Progressing

## 2024-01-12 ENCOUNTER — Inpatient Hospital Stay
Admission: EM | Admit: 2024-01-12 | Discharge: 2024-01-20 | Discharge status: Against Medical Advice | Attending: Internal Medicine | Admitting: Internal Medicine

## 2024-01-12 ENCOUNTER — Other Ambulatory Visit: Payer: Self-pay

## 2024-01-12 ENCOUNTER — Emergency Department

## 2024-01-12 DIAGNOSIS — R000 Tachycardia, unspecified: Secondary | ICD-10-CM | POA: Diagnosis not present

## 2024-01-12 DIAGNOSIS — G928 Other toxic encephalopathy: Secondary | ICD-10-CM | POA: Diagnosis not present

## 2024-01-12 DIAGNOSIS — I10 Essential (primary) hypertension: Secondary | ICD-10-CM | POA: Diagnosis present

## 2024-01-12 DIAGNOSIS — E8729 Other acidosis: Secondary | ICD-10-CM | POA: Diagnosis present

## 2024-01-12 DIAGNOSIS — Z79899 Other long term (current) drug therapy: Secondary | ICD-10-CM | POA: Diagnosis not present

## 2024-01-12 DIAGNOSIS — I4891 Unspecified atrial fibrillation: Secondary | ICD-10-CM | POA: Diagnosis not present

## 2024-01-12 DIAGNOSIS — D696 Thrombocytopenia, unspecified: Secondary | ICD-10-CM | POA: Diagnosis present

## 2024-01-12 DIAGNOSIS — F418 Other specified anxiety disorders: Secondary | ICD-10-CM | POA: Diagnosis not present

## 2024-01-12 DIAGNOSIS — S41112A Laceration without foreign body of left upper arm, initial encounter: Secondary | ICD-10-CM

## 2024-01-12 DIAGNOSIS — I483 Typical atrial flutter: Secondary | ICD-10-CM | POA: Diagnosis present

## 2024-01-12 DIAGNOSIS — Z6832 Body mass index (BMI) 32.0-32.9, adult: Secondary | ICD-10-CM | POA: Diagnosis not present

## 2024-01-12 DIAGNOSIS — E872 Acidosis, unspecified: Secondary | ICD-10-CM | POA: Diagnosis present

## 2024-01-12 DIAGNOSIS — I493 Ventricular premature depolarization: Secondary | ICD-10-CM | POA: Diagnosis present

## 2024-01-12 DIAGNOSIS — D6959 Other secondary thrombocytopenia: Secondary | ICD-10-CM | POA: Diagnosis present

## 2024-01-12 DIAGNOSIS — Z87891 Personal history of nicotine dependence: Secondary | ICD-10-CM

## 2024-01-12 DIAGNOSIS — R7401 Elevation of levels of liver transaminase levels: Secondary | ICD-10-CM | POA: Diagnosis present

## 2024-01-12 DIAGNOSIS — Z23 Encounter for immunization: Secondary | ICD-10-CM | POA: Diagnosis not present

## 2024-01-12 DIAGNOSIS — I4892 Unspecified atrial flutter: Secondary | ICD-10-CM | POA: Diagnosis not present

## 2024-01-12 DIAGNOSIS — R21 Rash and other nonspecific skin eruption: Secondary | ICD-10-CM | POA: Diagnosis present

## 2024-01-12 DIAGNOSIS — Z8249 Family history of ischemic heart disease and other diseases of the circulatory system: Secondary | ICD-10-CM | POA: Diagnosis not present

## 2024-01-12 DIAGNOSIS — E871 Hypo-osmolality and hyponatremia: Secondary | ICD-10-CM | POA: Diagnosis not present

## 2024-01-12 DIAGNOSIS — K7460 Unspecified cirrhosis of liver: Secondary | ICD-10-CM | POA: Diagnosis present

## 2024-01-12 DIAGNOSIS — E876 Hypokalemia: Secondary | ICD-10-CM | POA: Diagnosis present

## 2024-01-12 DIAGNOSIS — K760 Fatty (change of) liver, not elsewhere classified: Secondary | ICD-10-CM | POA: Diagnosis present

## 2024-01-12 DIAGNOSIS — I4819 Other persistent atrial fibrillation: Principal | ICD-10-CM | POA: Diagnosis present

## 2024-01-12 DIAGNOSIS — E669 Obesity, unspecified: Secondary | ICD-10-CM | POA: Diagnosis present

## 2024-01-12 DIAGNOSIS — Z7901 Long term (current) use of anticoagulants: Secondary | ICD-10-CM | POA: Diagnosis not present

## 2024-01-12 DIAGNOSIS — E66811 Obesity, class 1: Secondary | ICD-10-CM | POA: Diagnosis present

## 2024-01-12 DIAGNOSIS — F10939 Alcohol use, unspecified with withdrawal, unspecified: Secondary | ICD-10-CM | POA: Diagnosis present

## 2024-01-12 DIAGNOSIS — S51812A Laceration without foreign body of left forearm, initial encounter: Secondary | ICD-10-CM | POA: Diagnosis present

## 2024-01-12 DIAGNOSIS — R7989 Other specified abnormal findings of blood chemistry: Secondary | ICD-10-CM | POA: Diagnosis present

## 2024-01-12 DIAGNOSIS — W19XXXA Unspecified fall, initial encounter: Secondary | ICD-10-CM | POA: Diagnosis present

## 2024-01-12 DIAGNOSIS — F32A Depression, unspecified: Secondary | ICD-10-CM | POA: Diagnosis present

## 2024-01-12 DIAGNOSIS — F419 Anxiety disorder, unspecified: Secondary | ICD-10-CM | POA: Diagnosis present

## 2024-01-12 DIAGNOSIS — F10231 Alcohol dependence with withdrawal delirium: Secondary | ICD-10-CM | POA: Diagnosis present

## 2024-01-12 DIAGNOSIS — Y92000 Kitchen of unspecified non-institutional (private) residence as  the place of occurrence of the external cause: Secondary | ICD-10-CM

## 2024-01-12 DIAGNOSIS — Z8051 Family history of malignant neoplasm of kidney: Secondary | ICD-10-CM

## 2024-01-12 DIAGNOSIS — Y92009 Unspecified place in unspecified non-institutional (private) residence as the place of occurrence of the external cause: Secondary | ICD-10-CM | POA: Diagnosis not present

## 2024-01-12 LAB — CBC WITH DIFFERENTIAL/PLATELET
Abs Immature Granulocytes: 0.05 K/uL (ref 0.00–0.07)
Basophils Absolute: 0 K/uL (ref 0.0–0.1)
Basophils Relative: 0 %
Eosinophils Absolute: 0 K/uL (ref 0.0–0.5)
Eosinophils Relative: 0 %
HCT: 43.2 % (ref 39.0–52.0)
Hemoglobin: 15.7 g/dL (ref 13.0–17.0)
Immature Granulocytes: 1 %
Lymphocytes Relative: 7 %
Lymphs Abs: 0.7 K/uL (ref 0.7–4.0)
MCH: 32.1 pg (ref 26.0–34.0)
MCHC: 36.3 g/dL — ABNORMAL HIGH (ref 30.0–36.0)
MCV: 88.3 fL (ref 80.0–100.0)
Monocytes Absolute: 0.7 K/uL (ref 0.1–1.0)
Monocytes Relative: 6 %
Neutro Abs: 8.9 K/uL — ABNORMAL HIGH (ref 1.7–7.7)
Neutrophils Relative %: 86 %
Platelets: 82 K/uL — ABNORMAL LOW (ref 150–400)
RBC: 4.89 MIL/uL (ref 4.22–5.81)
RDW: 12.9 % (ref 11.5–15.5)
Smear Review: NORMAL
WBC: 10.4 K/uL (ref 4.0–10.5)
nRBC: 0 % (ref 0.0–0.2)

## 2024-01-12 LAB — COMPREHENSIVE METABOLIC PANEL WITH GFR
ALT: 56 U/L — ABNORMAL HIGH (ref 0–44)
AST: 61 U/L — ABNORMAL HIGH (ref 15–41)
Albumin: 4.7 g/dL (ref 3.5–5.0)
Alkaline Phosphatase: 69 U/L (ref 38–126)
Anion gap: 30 — ABNORMAL HIGH (ref 5–15)
BUN: 11 mg/dL (ref 6–20)
CO2: 18 mmol/L — ABNORMAL LOW (ref 22–32)
Calcium: 8.9 mg/dL (ref 8.9–10.3)
Chloride: 88 mmol/L — ABNORMAL LOW (ref 98–111)
Creatinine, Ser: 0.91 mg/dL (ref 0.61–1.24)
GFR, Estimated: >60 mL/min (ref >60)
Glucose, Bld: 119 mg/dL — ABNORMAL HIGH (ref 70–99)
Potassium: 3.3 mmol/L — ABNORMAL LOW (ref 3.5–5.1)
Sodium: 136 mmol/L (ref 135–145)
Total Bilirubin: 1.6 mg/dL — ABNORMAL HIGH (ref 0.0–1.2)
Total Protein: 7.8 g/dL (ref 6.5–8.1)

## 2024-01-12 LAB — ETHANOL: Alcohol, Ethyl (B): <15 mg/dL (ref <15)

## 2024-01-12 LAB — MAGNESIUM: Magnesium: 2 mg/dL (ref 1.7–2.4)

## 2024-01-12 LAB — PHOSPHORUS: Phosphorus: 3.5 mg/dL (ref 2.5–4.6)

## 2024-01-12 LAB — LIPASE, BLOOD: Lipase: 48 U/L (ref 11–51)

## 2024-01-12 MED ORDER — DILTIAZEM HCL-DEXTROSE 125-5 MG/125ML-% IV SOLN (PREMIX)
5.0000 mg/h | INTRAVENOUS | Status: DC
  Administered 2024-01-12: 21:00:00 5 mg/h via INTRAVENOUS
  Administered 2024-01-13 – 2024-01-16 (×7): 15 mg/h via INTRAVENOUS
  Filled 2024-01-12 (×9): qty 125

## 2024-01-12 MED ORDER — SODIUM CHLORIDE 0.9 % IV BOLUS
1000.0000 mL | Freq: Once | INTRAVENOUS | Status: AC
  Administered 2024-01-12: 19:00:00 1000 mL via INTRAVENOUS

## 2024-01-12 MED ORDER — PHENOBARBITAL SODIUM 65 MG/ML IJ SOLN: 65.0000 mg | Freq: Three times a day (TID) | INTRAMUSCULAR | Status: DC

## 2024-01-12 MED ORDER — HYDRALAZINE HCL 20 MG/ML IJ SOLN: 5.0000 mg | INTRAMUSCULAR | Status: DC | PRN

## 2024-01-12 MED ORDER — PHENOBARBITAL SODIUM 130 MG/ML IJ SOLN
130.0000 mg | Freq: Once | INTRAMUSCULAR | Status: AC
  Administered 2024-01-12: 20:00:00 130 mg via INTRAVENOUS
  Filled 2024-01-12: qty 1

## 2024-01-12 MED ORDER — LORAZEPAM 2 MG/ML IJ SOLN: 0.0000 mg | Freq: Two times a day (BID) | INTRAMUSCULAR | Status: DC

## 2024-01-12 MED ORDER — ONDANSETRON HCL 4 MG/2ML IJ SOLN
4.0000 mg | Freq: Once | INTRAMUSCULAR | Status: AC
  Administered 2024-01-12: 19:00:00 4 mg via INTRAVENOUS
  Filled 2024-01-12: qty 2

## 2024-01-12 MED ORDER — ONDANSETRON HCL 4 MG/2ML IJ SOLN
4.0000 mg | Freq: Three times a day (TID) | INTRAMUSCULAR | Status: DC | PRN
  Administered 2024-01-13 – 2024-01-15 (×4): 4 mg via INTRAVENOUS
  Filled 2024-01-12 (×4): qty 2

## 2024-01-12 MED ORDER — THIAMINE MONONITRATE 100 MG PO TABS
100.0000 mg | ORAL_TABLET | Freq: Every day | ORAL | Status: DC
  Administered 2024-01-15 – 2024-01-20 (×6): 100 mg via ORAL
  Filled 2024-01-12 (×7): qty 1

## 2024-01-12 MED ORDER — LORAZEPAM 1 MG PO TABS
1.0000 mg | ORAL_TABLET | ORAL | Status: DC | PRN
  Administered 2024-01-13: 16:00:00 1 mg via ORAL
  Filled 2024-01-12: qty 1

## 2024-01-12 MED ORDER — PHENOBARBITAL SODIUM 65 MG/ML IJ SOLN: 32.5000 mg | Freq: Three times a day (TID) | INTRAMUSCULAR | Status: DC

## 2024-01-12 MED ORDER — METOPROLOL TARTRATE 5 MG/5ML IV SOLN
5.0000 mg | Freq: Once | INTRAVENOUS | Status: AC
  Administered 2024-01-12: 23:00:00 5 mg via INTRAVENOUS
  Filled 2024-01-12: qty 5

## 2024-01-12 MED ORDER — TETANUS-DIPHTH-ACELL PERTUSSIS 5-2-15.5 LF-MCG/0.5 IM SUSP
0.5000 mL | Freq: Once | INTRAMUSCULAR | Status: AC
  Administered 2024-01-12: 20:00:00 0.5 mL via INTRAMUSCULAR
  Filled 2024-01-12: qty 0.5

## 2024-01-12 MED ORDER — PHENOBARBITAL SODIUM 130 MG/ML IJ SOLN
130.0000 mg | Freq: Once | INTRAMUSCULAR | Status: AC
  Administered 2024-01-12: 19:00:00 130 mg via INTRAVENOUS
  Filled 2024-01-12: qty 1

## 2024-01-12 MED ORDER — IBUPROFEN 400 MG PO TABS: 200.0000 mg | ORAL_TABLET | Freq: Four times a day (QID) | ORAL | Status: DC | PRN

## 2024-01-12 MED ORDER — SODIUM CHLORIDE 0.9 % IV SOLN: INTRAVENOUS | Status: DC

## 2024-01-12 MED ORDER — POTASSIUM CHLORIDE CRYS ER 20 MEQ PO TBCR
40.0000 meq | EXTENDED_RELEASE_TABLET | Freq: Once | ORAL | Status: AC
  Administered 2024-01-12: 23:00:00 40 meq via ORAL
  Filled 2024-01-12: qty 2

## 2024-01-12 MED ORDER — FOLIC ACID 1 MG PO TABS
1.0000 mg | ORAL_TABLET | Freq: Every day | ORAL | Status: DC
  Administered 2024-01-12 – 2024-01-20 (×9): 1 mg via ORAL
  Filled 2024-01-12 (×9): qty 1

## 2024-01-12 MED ORDER — DILTIAZEM HCL 25 MG/5ML IV SOLN
10.0000 mg | Freq: Once | INTRAVENOUS | Status: AC
  Administered 2024-01-12: 20:00:00 10 mg via INTRAVENOUS
  Filled 2024-01-12: qty 5

## 2024-01-12 MED ORDER — LORAZEPAM 2 MG/ML IJ SOLN
0.0000 mg | Freq: Four times a day (QID) | INTRAMUSCULAR | Status: DC
  Administered 2024-01-12 – 2024-01-13 (×2): 2 mg via INTRAVENOUS
  Filled 2024-01-12 (×2): qty 1

## 2024-01-12 MED ORDER — PHENOBARBITAL SODIUM 130 MG/ML IJ SOLN
97.5000 mg | Freq: Three times a day (TID) | INTRAMUSCULAR | Status: DC
  Administered 2024-01-13 (×2): 97.5 mg via INTRAVENOUS
  Filled 2024-01-12 (×2): qty 1

## 2024-01-12 MED ORDER — SODIUM CHLORIDE 0.9 % IV BOLUS
1500.0000 mL | Freq: Once | INTRAVENOUS | Status: AC
  Administered 2024-01-12: 21:00:00 1500 mL via INTRAVENOUS

## 2024-01-12 MED ORDER — THIAMINE HCL 100 MG/ML IJ SOLN
100.0000 mg | Freq: Every day | INTRAMUSCULAR | Status: DC
  Administered 2024-01-12 – 2024-01-14 (×3): 100 mg via INTRAVENOUS
  Filled 2024-01-12 (×3): qty 2

## 2024-01-12 MED ORDER — ADULT MULTIVITAMIN W/MINERALS CH
1.0000 | ORAL_TABLET | Freq: Every day | ORAL | Status: DC
  Administered 2024-01-12 – 2024-01-20 (×9): 1 via ORAL
  Filled 2024-01-12 (×9): qty 1

## 2024-01-12 MED ORDER — LORAZEPAM 2 MG/ML IJ SOLN
1.0000 mg | INTRAMUSCULAR | Status: DC | PRN
  Administered 2024-01-13: 05:00:00 2 mg via INTRAVENOUS
  Filled 2024-01-12: qty 1

## 2024-01-12 NOTE — ED Triage Notes (Signed)
 Pt BIB [facility] from home. Initially called EMS for laceration to LFA that occurred 2 days ago. Reports fall, sliced arm on cast iron. Upon EMS evaluation, pt found be tachycardic 170-200 and nauseous. Reports last alcohol  3 days ago. States drinks 4-5 bottles wine daily normally.

## 2024-01-12 NOTE — ED Provider Notes (Signed)
 [facility] Provider Note    Event Date/Time   First MD Initiated Contact with Patient 01/12/24 1846     (approximate)   History   Withdrawal and Extremity Laceration  Pt BIB [facility] from home. Initially called EMS for laceration to LFA that occurred 2 days ago. Reports fall, sliced arm on cast iron. Upon EMS evaluation, pt found be tachycardic 170-200 and nauseous. Reports last alcohol  3 days ago. States drinks 4-5 bottles wine daily normally.   HPI Bobby Williams is a 58 y.o. male PMH alcohol  use disorder, hypertension, sinus tachycardia, thrombocytopenia presents for evaluation of nausea and vomiting, concern for alcohol  withdrawal, left arm laceration - Patient states he has been drinking heavily since Thanksgiving, usually about 4 bottles of white wine daily.  Stopped drinking 2-3 days ago.  Did also have a fall about 2 or 3 days ago in which it is left arm on cast iron.  Says he did not lose consciousness nor hit his head when he fell. - Has been vomiting since shortly after he stopped drinking - No complaints of abdominal pain   Per chart review, patient was recently admitted 11/29/2023-12/03/2023 after presenting in alcohol  withdrawal.  Did have notable sinus tachycardia and had to be placed on a Cardizem  drip.     Physical Exam   Triage Vital Signs: BP (!) 143/87   Pulse (!) 162   Temp (!) 97.5 F (36.4 C)   Resp 18   Ht 6' 3 (1.905 m)   Wt 116.6 kg   SpO2 95%   BMI 32.12 kg/m    Most recent vital signs: Vitals:   01/12/24 2015 01/12/24 2227  BP: (!) 156/94 (!) 143/87  Pulse: (!) 179 (!) 162  Resp: 18   Temp:    SpO2: 95%      General: Awake, no distress. +tremulous, +tongue wag.  Dried vomit on his shirt. HEENT: Normocephalic, atraumatic CV:  Good peripheral perfusion.  Tender tachycardia, RP 2+ Resp:  Normal effort. CTAB Abd:  No distention. Nontender to deep palpation throughout Other:  4cm laceration to L forearm, +  full-thickness, muscle exposed though no apparent laceration to muscle.  Hemostatic.  No foreign bodies appreciated.  No exposed tendons.  Full range of motion of all fingers at DIP and PIP.  Radian/median/ulnar motor intact.  SI LT.  RP 2+.   ED Results / Procedures / Treatments   Labs (all labs ordered are listed, but only abnormal results are displayed) Labs Reviewed  COMPREHENSIVE METABOLIC PANEL WITH GFR - Abnormal; Notable for the following components:      Result Value   Potassium 3.3 (*)    Chloride 88 (*)    CO2 18 (*)    Glucose, Bld 119 (*)    AST 61 (*)    ALT 56 (*)    Total Bilirubin 1.6 (*)    Anion gap 30 (*)    All other components within normal limits  CBC WITH DIFFERENTIAL/PLATELET - Abnormal; Notable for the following components:   MCHC 36.3 (*)    Platelets 82 (*)    Neutro Abs 8.9 (*)    All other components within normal limits  LIPASE, BLOOD  ETHANOL  MAGNESIUM  PHOSPHORUS  COMPREHENSIVE METABOLIC PANEL WITH GFR  CBC     EKG  Ecg = A-fib versus sinus tach, rate 181, no gross ST elevation seen, no significant repolarization normality, normal axis, normal intervals.  No clear evidence of ischemia.  RADIOLOGY Chest x-ray interpreted by myself and radiology report reviewed.  No acute pathology identified.    PROCEDURES:  Critical Care performed: Yes, see critical care procedure note(s)  .Critical Care  Performed by: Clarine Ozell LABOR, MD Authorized by: Clarine Ozell LABOR, MD   Critical care provider statement:    Critical care time (minutes):  45   Critical care time was exclusive of:  Separately billable procedures and treating other patients   Critical care was necessary to treat or prevent imminent or life-threatening deterioration of the following conditions:  Cardiac failure and toxidrome   Critical care was time spent personally by me on the following activities:  Development of treatment plan with patient or surrogate, discussions with  consultants, evaluation of patient's response to treatment, examination of patient, ordering and review of laboratory studies, ordering and review of radiographic studies, ordering and performing treatments and interventions, pulse oximetry, re-evaluation of patient's condition and review of old charts   I assumed direction of critical care for this patient from another provider in my specialty: no     Care discussed with: admitting provider      MEDICATIONS ORDERED IN ED: Medications  0.9 %  sodium chloride  infusion ( Intravenous New Bag/Given 01/12/24 2246)  diltiazem  (CARDIZEM ) 125 mg in dextrose  5% 125 mL (1 mg/mL) infusion (10 mg/hr Intravenous Infusion Verify 01/12/24 2229)  ondansetron  (ZOFRAN ) injection 4 mg (has no administration in time range)  hydrALAZINE  (APRESOLINE ) injection 5 mg (has no administration in time range)  ibuprofen  (ADVIL ) tablet 200 mg (has no administration in time range)  LORazepam  (ATIVAN ) tablet 1-4 mg (has no administration in time range)    Or  LORazepam  (ATIVAN ) injection 1-4 mg (has no administration in time range)  thiamine  (VITAMIN B1) tablet 100 mg ( Oral See Alternative 01/12/24 2238)    Or  thiamine  (VITAMIN B1) injection 100 mg (100 mg Intravenous Given 01/12/24 2238)  folic acid  (FOLVITE ) tablet 1 mg (1 mg Oral Given 01/12/24 2250)  multivitamin with minerals tablet 1 tablet (1 tablet Oral Given 01/12/24 2250)  LORazepam  (ATIVAN ) injection 0-4 mg (2 mg Intravenous Given 01/12/24 2236)    Followed by  LORazepam  (ATIVAN ) injection 0-4 mg (has no administration in time range)  PHENObarbital  (LUMINAL) injection 97.5 mg (has no administration in time range)    Followed by  PHENObarbital  (LUMINAL) injection 65 mg (has no administration in time range)    Followed by  PHENObarbital  (LUMINAL) injection 32.5 mg (has no administration in time range)  PHENObarbital  (LUMINAL) injection 130 mg (130 mg Intravenous Given 01/12/24 1857)  PHENObarbital  (LUMINAL)  injection 130 mg (130 mg Intravenous Given 01/12/24 1857)  sodium chloride  0.9 % bolus 1,000 mL (0 mLs Intravenous Stopped 01/12/24 2230)  ondansetron  (ZOFRAN ) injection 4 mg (4 mg Intravenous Given 01/12/24 1917)  Tdap (ADACEL ) injection 0.5 mL (0.5 mLs Intramuscular Given 01/12/24 2015)  PHENObarbital  (LUMINAL) injection 130 mg (130 mg Intravenous Given 01/12/24 2013)  PHENObarbital  (LUMINAL) injection 130 mg (130 mg Intravenous Given 01/12/24 2010)  diltiazem  (CARDIZEM ) injection 10 mg (10 mg Intravenous Given 01/12/24 2019)  sodium chloride  0.9 % bolus 1,500 mL (1,500 mLs Intravenous New Bag/Given 01/12/24 2034)  potassium chloride  SA (KLOR-CON  M) CR tablet 40 mEq (40 mEq Oral Given 01/12/24 2250)  metoprolol  tartrate (LOPRESSOR ) injection 5 mg (5 mg Intravenous Given 01/12/24 2240)     IMPRESSION / MDM / ASSESSMENT AND PLAN / ED COURSE  I reviewed the triage vital signs and the nursing notes.  DDX/MDM/AP: Differential diagnosis includes, but is not limited to, severe alcohol  withdrawal, consider underlying dehydration, electrolyte abnormality.  Do not suspect primary intra-abdominal pathology at this time given benign exam though consider pancreatitis.  Fortunately no hallucinations or seizures and no history of seizures that we will continue to monitor closely and treat alcohol  withdrawal aggressively.  Regarding his wound, will be very high risk to close at this time--Will leave open and plan for healing by secondary intention.  Plan for aggressive irrigation and Vaseline gauze.   Plan: - Labs - EKG - Cardiac monitor - IV fluid - Phenobarbital  -Wound care, update Tdap -Consider rate control if not improving - Anticipate admission  Patient's presentation is most consistent with acute presentation with potential threat to life or bodily function.  The patient is on the cardiac monitor to evaluate for evidence of arrhythmia and/or significant heart  rate changes.  ED course below.  Patient responding well to serial rounds of phenobarbital  here though does remain notably tachycardic, suspect A-fib RVR, mild response to diltiazem  IV, escalated to diltiazem  drip and coordination with admitting medicine service.  Laboratory workup overall unremarkable, some evidence of dehydration with mild hypokalemia, low chloride, low bicarb.  Clinical Course as of 01/12/24 2318  Tue Jan 12, 2024  1944 CBC reviewed, unremarkable.  Stable chronic moderate thrombocytopenia. [MM]  1944 Magnesium normal  Etoh undetectable [MM]  1958 CMP with mild hypokalemia, mildly low bicarb, mild transaminitis  Anion gap elevated at 30, suspect related to alcohol   Hospitalist consult placed [MM]    Clinical Course User Index [MM] Clarine Ozell LABOR, MD     FINAL CLINICAL IMPRESSION(S) / ED DIAGNOSES   Final diagnoses:  Alcohol  withdrawal syndrome with complication (HCC)  Arm laceration, left, initial encounter  Tachycardia     Rx / DC Orders   ED Discharge Orders     None        Note:  This document was prepared using Dragon voice recognition software and may include unintentional dictation errors.   Clarine Ozell LABOR, MD 01/12/24 [phone]

## 2024-01-12 NOTE — ED Notes (Signed)
 SpO2 92% on room air. Placed on 2L [place].

## 2024-01-12 NOTE — ED Notes (Signed)
 CCMD called to initiate cardiac monitoring.

## 2024-01-12 NOTE — H&P (Incomplete)
 History and Physical    Bobby Williams FMW:[id number] DOB: September 30, 1965 DOA: 01/12/2024  Referring MD/NP/PA:   PCP: Pcp, No   Patient coming from:  The patient is coming from home.     Chief Complaint: Alcohol  withdrawal, fall, left forearm skin laceration  HPI: Bobby Williams is a 58 y.o. male with medical history significant of  alcohol  abuse, hypertension, depression with anxiety, thrombocytopenia, presents with alcohol  withdrawal and fall, left forearm skin laceration  Patient was recently admitted due to alcohol  withdrawal in 11/2.  He continues to drink alcohol  heavily.  He states that he fell in kitchen 2 days ago. No LOC.  He has left forearm skin laceration.  Denies headache or neck pain.  He states that he stopped drinking 2 days ago, developed tremulous. No chest pain, cough, SOB.  He has nausea vomited few times, no hematemesis or dark stool.  No diarrhea or abdominal pain.  No symptoms of UTI.  No fever or chills  Patient was found to have tachycardia with heart rate up to 170s, Cardizem  drip was started in ED.  Data reviewed independently and ED Course: pt was found to have WBC 10.4, alcohol  level less than 15, GFR 60, potassium 3.3, magnesium 2.0, phosphorus 3.5.  Temperature 97.5, blood pressure 121/82, RR 23, oxygen saturation 92-96% on room air.  Chest x-ray negative.  Patient is admitted to PCU as inpatient. Message sent to [facility] pool for [facility] card consult  EKG: I have personally reviewed.  Tachycardia with heart rate of 181, poor R wave aggression.  The repeated EKG showed atrial fibrillation with heart rate 122.   Review of Systems:   General: no fevers, chills, no body weight gain, has poor appetite, has fatigue HEENT: no blurry vision, hearing changes or sore throat Respiratory: no dyspnea, coughing, wheezing CV: no chest pain, no palpitations GI: has nausea, vomiting, no abdominal pain, diarrhea, constipation GU: no dysuria, burning on urination, increased  urinary frequency, hematuria  Ext: no leg edema Neuro: no unilateral weakness, numbness, or tingling, no vision change or hearing loss. Has tremulous and full Skin: Has left forearm skin laceration MSK: No muscle spasm, no deformity, no limitation of range of movement in spin Heme: No easy bruising.  Travel history: No recent long distant travel.   Allergy: Allergies[1]  Past Medical History:  Diagnosis Date   Alcohol  abuse    Anxiety    Depression with anxiety    Hypertension    Pneumonia    Thrombocytopenia     Past Surgical History:  Procedure Laterality Date   NO PAST SURGERIES      Social History:  reports that he has never smoked. He quit smokeless tobacco use about 7 years ago.  His smokeless tobacco use included chew. He reports current alcohol  use of about 28.0 standard drinks of alcohol  per week. He reports that he does not use drugs.  Family History:  Family History  Problem Relation Age of Onset   Heart disease Mother    Kidney cancer Father    Diabetes Neg Hx      Prior to Admission medications  Medication Sig Start Date End Date Taking? Authorizing Provider  amLODipine  (NORVASC ) 10 MG tablet 1 TABLET DAILY AT 8:00 AM Patient not taking: Reported on 01/12/2024 08/13/21   Britta King, MD  buPROPion  (WELLBUTRIN ) 75 MG tablet Take 1 tablet (75 mg total) by mouth 2 (two) times daily. Patient not taking: Reported on 01/12/2024 12/03/23   Dorinda Homans  T, MD  chlorthalidone  (HYGROTON ) 25 MG tablet TAKE 1 TABLET (25 MG TOTAL) BY MOUTH DAILY. Patient not taking: Reported on 01/12/2024 01/28/22   Britta King, MD  folic acid  (FOLVITE ) 1 MG tablet Take 1 tablet (1 mg total) by mouth daily. Patient not taking: Reported on 01/12/2024 12/04/23   Dorinda Drue DASEN, MD  Multiple Vitamin (MULTIVITAMIN WITH MINERALS) TABS tablet Take 1 tablet by mouth daily. Patient not taking: Reported on 01/12/2024 12/04/23   Dorinda Drue DASEN, MD  thiamine  (VITAMIN B-1) 100 MG tablet Take 1  tablet (100 mg total) by mouth daily. Patient not taking: Reported on 01/12/2024 12/04/23   Dorinda Drue DASEN, MD  traZODone  (DESYREL ) 100 MG tablet TAKE 1 TABLET BY MOUTH EVERYDAY AT BEDTIME Patient not taking: Reported on 01/12/2024 06/25/21   Britta King, MD    Physical Exam: Vitals:   01/12/24 2245 01/12/24 2300 01/12/24 2315 01/12/24 2330  BP: 125/86 126/77 138/80 135/76  Pulse:  (!) 157 (!) 115 (!) 132  Resp: 19 17 19 20   Temp:      SpO2: 91%  96% 96%  Weight:      Height:       General: Not in acute distress. Pt is tremulous HEENT:       Eyes: PERRL, EOMI, no jaundice       ENT: No discharge from the ears and nose, no pharynx injection, no tonsillar enlargement.        Neck: No JVD, no bruit, no mass felt. Heme: No neck lymph node enlargement. Cardiac: S1/S2, irregularly irregular rhythm.  No gallops or rubs. Respiratory: No rales, wheezing, rhonchi or rubs. GI: Soft, nondistended, nontender, no rebound pain, no organomegaly, BS present. GU: No hematuria Ext: No pitting leg edema bilaterally. 1+DP/PT pulse bilaterally. Musculoskeletal: No joint deformities, No joint redness or warmth, no limitation of ROM in spin. Skin: has left forearm skin laceration. Neuro: Alert, oriented X3, cranial nerves II-XII grossly intact, moves all extremities normally. Psych: Patient is not psychotic, no suicidal or hemocidal ideation.  Labs on Admission: I have personally reviewed following labs and imaging studies  CBC: Recent Labs  Lab 01/12/24 1854  WBC 10.4  NEUTROABS 8.9*  HGB 15.7  HCT 43.2  MCV 88.3  PLT 82*   Basic Metabolic Panel: Recent Labs  Lab 01/12/24 1854 01/12/24 1856  NA 136  --   K 3.3*  --   CL 88*  --   CO2 18*  --   GLUCOSE 119*  --   BUN 11  --   CREATININE 0.91  --   CALCIUM 8.9  --   MG  --  2.0  PHOS 3.5  --    GFR: Estimated Creatinine Clearance: 123.3 mL/min (by C-G formula based on SCr of 0.91 mg/dL). Liver Function Tests: Recent Labs  Lab  01/12/24 1854  AST 61*  ALT 56*  ALKPHOS 69  BILITOT 1.6*  PROT 7.8  ALBUMIN 4.7   Recent Labs  Lab 01/12/24 1854  LIPASE 48   No results for input(s): AMMONIA in the last 168 hours. Coagulation Profile: No results for input(s): INR, PROTIME in the last 168 hours. Cardiac Enzymes: No results for input(s): CKTOTAL, CKMB, CKMBINDEX, TROPONINI in the last 168 hours. BNP (last 3 results) No results for input(s): PROBNP in the last 8760 hours. HbA1C: No results for input(s): HGBA1C in the last 72 hours. CBG: No results for input(s): GLUCAP in the last 168 hours. Lipid Profile: No results for input(s): CHOL,  HDL, LDLCALC, TRIG, CHOLHDL, LDLDIRECT in the last 72 hours. Thyroid Function Tests: No results for input(s): TSH, T4TOTAL, FREET4, T3FREE, THYROIDAB in the last 72 hours. Anemia Panel: No results for input(s): VITAMINB12, FOLATE, FERRITIN, TIBC, IRON, RETICCTPCT in the last 72 hours. Urine analysis:    Component Value Date/Time   COLORURINE YELLOW (A) 11/30/2023 1120   APPEARANCEUR HAZY (A) 11/30/2023 1120   LABSPEC 1.020 11/30/2023 1120   PHURINE 7.0 11/30/2023 1120   GLUCOSEU NEGATIVE 11/30/2023 1120   HGBUR NEGATIVE 11/30/2023 1120   BILIRUBINUR NEGATIVE 11/30/2023 1120   KETONESUR 20 (A) 11/30/2023 1120   PROTEINUR 100 (A) 11/30/2023 1120   NITRITE NEGATIVE 11/30/2023 1120   LEUKOCYTESUR NEGATIVE 11/30/2023 1120   Sepsis Labs: @LABRCNTIP (procalcitonin:4,lacticidven:4) )No results found for this or any previous visit (from the past 240 hours).   Radiological Exams on Admission:   Assessment/Plan Principal Problem:   Alcohol  withdrawal (HCC) Active Problems:   Atrial fibrillation with RVR (HCC)   Fall at home, initial encounter with skin laceration in left forearm   HTN (hypertension)   Metabolic acidosis   Hypokalemia   Abnormal LFTs   Thrombocytopenia   Depression with anxiety   Obesity (BMI  30-39.9)   Assessment and Plan:   Alcohol  withdrawal Bobby Williams): Patient is still tremulous, extremely tachycardic  - Admitted to PCU as inpatient - Started phenobarbital  tapering - CIWA protocol - IV fluid: 2.5 L normal saline, Naima 75 cc/h - TOC consult  Atrial fibrillation with RVR (HCC):  CHA2DS2-VASc Score is 1 (hx of HTN).  Does not need chronic anticoagulant.  Likely triggered by alcohol  abuse.  Patient had TSH 6.605 and free T40.75 On 11/29/2023. - Cardizem  drip started with as needed IV metoprolol  - Message sent to [facility] pool for [facility] card consult  Fall at home, initial encounter with skin laceration in left forearm -Wound care consult - Fall precaution - Follow-up CT of head  HTN (hypertension) -Patient is on Cardizem  drip - IV hydralazine  as needed  Metabolic acidosis: Bicarbonate 18.  Likely due to alcoholic ketoacidosis. - IV fluids above  Hypokalemia: Potassium 3.3, magnesium 2.0, phosphorus 3.5. -Repleted potassium  Abnormal LFTs: ALP 69, AST 61, ALT 56, total bilirubin 1.6.  Most likely due to alcohol  abuse -Avoid using Tylenol   Thrombocytopenia: Platelet 82, this is chronic issue, likely due to alcohol  abuse. -Follow-up repeat CBC  Depression with anxiety: Patient is not taking medications currently. -Observe closely  Obesity (BMI 30-39.9): Patient has Obesity Class I, with body weight 116.6 Kg and BMI 32.12 kg/m2.  - Encourage losing weight - Exercise and healthy diet      DVT ppx: SCD  Code Status: Full code   Family Communication:     not done, no family member is at bed side.         Disposition Plan:  Anticipate discharge back to previous environment  Consults called:  Message sent to [facility] [place] [facility] pool for [facility] card consult  Admission status and Level of care: Progressive:    for obs as inpt        Dispo: The patient is from: Home              Anticipated d/c is to: Home              Anticipated d/c date is: 2 days               Patient currently is not medically stable to d/c.    Severity  of Illness:  The appropriate patient status for this patient is INPATIENT. Inpatient status is judged to be reasonable and necessary in order to provide the required intensity of service to ensure the patient's safety. The patient's presenting symptoms, physical exam findings, and initial radiographic and laboratory data in the context of their chronic comorbidities is felt to place them at high risk for further clinical deterioration. Furthermore, it is not anticipated that the patient will be medically stable for discharge from the [facility] within 2 midnights of admission.   * I certify that at the point of admission it is my clinical judgment that the patient will require inpatient [facility] care spanning beyond 2 midnights from the point of admission due to high intensity of service, high risk for further deterioration and high frequency of surveillance required.*       Date of Service 01/13/2024    Caleb Exon [facility]   If 7PM-7AM, please contact night-coverage [email or web address] 01/13/2024, 12:24 AM     [1] No Known Allergies

## 2024-01-13 ENCOUNTER — Inpatient Hospital Stay

## 2024-01-13 DIAGNOSIS — Y92009 Unspecified place in unspecified non-institutional (private) residence as the place of occurrence of the external cause: Secondary | ICD-10-CM | POA: Diagnosis not present

## 2024-01-13 DIAGNOSIS — I10 Essential (primary) hypertension: Secondary | ICD-10-CM | POA: Diagnosis not present

## 2024-01-13 DIAGNOSIS — S41112A Laceration without foreign body of left upper arm, initial encounter: Secondary | ICD-10-CM | POA: Diagnosis not present

## 2024-01-13 DIAGNOSIS — R000 Tachycardia, unspecified: Secondary | ICD-10-CM | POA: Diagnosis not present

## 2024-01-13 DIAGNOSIS — I4891 Unspecified atrial fibrillation: Secondary | ICD-10-CM | POA: Diagnosis not present

## 2024-01-13 DIAGNOSIS — D696 Thrombocytopenia, unspecified: Secondary | ICD-10-CM | POA: Diagnosis not present

## 2024-01-13 DIAGNOSIS — R7989 Other specified abnormal findings of blood chemistry: Secondary | ICD-10-CM | POA: Diagnosis not present

## 2024-01-13 DIAGNOSIS — E872 Acidosis, unspecified: Secondary | ICD-10-CM | POA: Diagnosis not present

## 2024-01-13 DIAGNOSIS — E669 Obesity, unspecified: Secondary | ICD-10-CM | POA: Diagnosis not present

## 2024-01-13 DIAGNOSIS — E876 Hypokalemia: Secondary | ICD-10-CM | POA: Diagnosis not present

## 2024-01-13 DIAGNOSIS — W19XXXA Unspecified fall, initial encounter: Secondary | ICD-10-CM | POA: Diagnosis not present

## 2024-01-13 DIAGNOSIS — F10939 Alcohol use, unspecified with withdrawal, unspecified: Secondary | ICD-10-CM | POA: Diagnosis not present

## 2024-01-13 LAB — CBC
HCT: 39.3 % (ref 39.0–52.0)
Hemoglobin: 14.1 g/dL (ref 13.0–17.0)
MCH: 32 pg (ref 26.0–34.0)
MCHC: 35.9 g/dL (ref 30.0–36.0)
MCV: 89.1 fL (ref 80.0–100.0)
Platelets: 50 K/uL — ABNORMAL LOW (ref 150–400)
RBC: 4.41 MIL/uL (ref 4.22–5.81)
RDW: 13 % (ref 11.5–15.5)
WBC: 8.9 K/uL (ref 4.0–10.5)
nRBC: 0 % (ref 0.0–0.2)

## 2024-01-13 LAB — COMPREHENSIVE METABOLIC PANEL WITH GFR
ALT: 43 U/L (ref 0–44)
AST: 48 U/L — ABNORMAL HIGH (ref 15–41)
Albumin: 4.1 g/dL (ref 3.5–5.0)
Alkaline Phosphatase: 56 U/L (ref 38–126)
Anion gap: 14 (ref 5–15)
BUN: 9 mg/dL (ref 6–20)
CO2: 24 mmol/L (ref 22–32)
Calcium: 8.6 mg/dL — ABNORMAL LOW (ref 8.9–10.3)
Chloride: 98 mmol/L (ref 98–111)
Creatinine, Ser: 0.76 mg/dL (ref 0.61–1.24)
GFR, Estimated: >60 mL/min (ref >60)
Glucose, Bld: 112 mg/dL — ABNORMAL HIGH (ref 70–99)
Potassium: 4 mmol/L (ref 3.5–5.1)
Sodium: 135 mmol/L (ref 135–145)
Total Bilirubin: 1.1 mg/dL (ref 0.0–1.2)
Total Protein: 6.8 g/dL (ref 6.5–8.1)

## 2024-01-13 LAB — PROTIME-INR
INR: 1 (ref 0.8–1.2)
Prothrombin Time: 13.9 s (ref 11.4–15.2)

## 2024-01-13 LAB — APTT: aPTT: 29 s (ref 24–36)

## 2024-01-13 LAB — HEPARIN LEVEL (UNFRACTIONATED): Heparin Unfractionated: <0.1 [IU]/mL — ABNORMAL LOW (ref 0.30–0.70)

## 2024-01-13 MED ORDER — LORAZEPAM 2 MG/ML IJ SOLN
2.0000 mg | Freq: Four times a day (QID) | INTRAMUSCULAR | Status: DC
  Administered 2024-01-13 – 2024-01-16 (×11): 2 mg via INTRAVENOUS
  Filled 2024-01-13 (×11): qty 1

## 2024-01-13 MED ORDER — HEPARIN (PORCINE) 25000 UT/250ML-% IV SOLN
2200.0000 [IU]/h | INTRAVENOUS | Status: DC
  Administered 2024-01-13: 1800 [IU]/h via INTRAVENOUS
  Administered 2024-01-13: 12:00:00 1550 [IU]/h via INTRAVENOUS
  Filled 2024-01-13 (×2): qty 250

## 2024-01-13 MED ORDER — AMIODARONE HCL IN DEXTROSE 360-4.14 MG/200ML-% IV SOLN
30.0000 mg/h | INTRAVENOUS | Status: DC
  Administered 2024-01-13 (×2): 30 mg/h via INTRAVENOUS
  Filled 2024-01-13: qty 200

## 2024-01-13 MED ORDER — METOPROLOL TARTRATE 25 MG PO TABS
25.0000 mg | ORAL_TABLET | Freq: Four times a day (QID) | ORAL | Status: DC
  Administered 2024-01-13 – 2024-01-14 (×3): 25 mg via ORAL
  Filled 2024-01-13 (×4): qty 1

## 2024-01-13 MED ORDER — SODIUM CHLORIDE 0.9 % IV BOLUS
500.0000 mL | Freq: Once | INTRAVENOUS | Status: AC
  Administered 2024-01-13: 05:00:00 500 mL via INTRAVENOUS

## 2024-01-13 MED ORDER — POTASSIUM CHLORIDE CRYS ER 20 MEQ PO TBCR
40.0000 meq | EXTENDED_RELEASE_TABLET | Freq: Once | ORAL | Status: AC
  Administered 2024-01-13: 05:00:00 40 meq via ORAL
  Filled 2024-01-13: qty 2

## 2024-01-13 MED ORDER — HEPARIN BOLUS VIA INFUSION
2750.0000 [IU] | Freq: Once | INTRAVENOUS | Status: AC
  Administered 2024-01-13: 12:00:00 2750 [IU] via INTRAVENOUS
  Filled 2024-01-13: qty 2750

## 2024-01-13 MED ORDER — LORAZEPAM 2 MG/ML IJ SOLN: 1.0000 mg | INTRAMUSCULAR | Status: AC | PRN

## 2024-01-13 MED ORDER — HEPARIN BOLUS VIA INFUSION
3000.0000 [IU] | Freq: Once | INTRAVENOUS | Status: AC
  Administered 2024-01-13: 20:00:00 3000 [IU] via INTRAVENOUS
  Filled 2024-01-13: qty 3000

## 2024-01-13 MED ORDER — METOPROLOL TARTRATE 5 MG/5ML IV SOLN: 5.0000 mg | Freq: Once | INTRAVENOUS | Status: DC

## 2024-01-13 MED ORDER — AMIODARONE HCL IN DEXTROSE 360-4.14 MG/200ML-% IV SOLN
60.0000 mg/h | INTRAVENOUS | Status: AC
  Administered 2024-01-13 (×2): 60 mg/h via INTRAVENOUS
  Filled 2024-01-13 (×2): qty 200

## 2024-01-13 MED ORDER — LORAZEPAM 1 MG PO TABS
1.0000 mg | ORAL_TABLET | ORAL | Status: AC | PRN
  Administered 2024-01-14: 04:00:00 1 mg via ORAL
  Filled 2024-01-13: qty 1

## 2024-01-13 MED ORDER — LORAZEPAM 2 MG/ML IJ SOLN
2.0000 mg | Freq: Four times a day (QID) | INTRAMUSCULAR | Status: DC
  Administered 2024-01-13 (×2): 2 mg via INTRAVENOUS
  Filled 2024-01-13 (×2): qty 1

## 2024-01-13 MED ORDER — AMIODARONE LOAD VIA INFUSION
150.0000 mg | Freq: Once | INTRAVENOUS | Status: AC
  Administered 2024-01-13: 10:00:00 150 mg via INTRAVENOUS
  Filled 2024-01-13: qty 83.34

## 2024-01-13 NOTE — Progress Notes (Signed)
° °      Overnight   NAME: Bobby Williams MRN: [id number] DOB : October 28, 1965    Date of Service   01/13/2024   HPI/Events of Note   HPI: Patient is a 58 year old male with medical history significant for alcohol  abuse, hypertension, depression with anxiety, thrombocytopenia who presented to the ER with withdrawal symptoms while.  Patient presents with left forearm skin laceration.  Patient was recently admitted due to alcohol  withdrawal November 2.  He continues to drink alcohol  heavily he states that he fell in the kitchen 2 days ago no LOC.  He denies headache neck pain.  Patient reports he stopped drinking 2 days ago and since has developed tremors.  On arrival to the emergency department patient was tachycardic with heart rate in the 170s.  Cardia that was started in the emergency room.    Overnight: RN reports patient continues to be in the 150s heart rate despite max Cardizem  drip.  CIWA on assessment was 5.  Advised RN to give as needed Ativan .  Ordered replacement potassium, and a 500 cc bolus.  Physical Exam Vitals reviewed.  Constitutional:      General: He is sleeping.     Appearance: He is overweight.  Cardiovascular:     Rate and Rhythm: Tachycardia present. Rhythm irregularly irregular.  Pulmonary:     Effort: Tachypnea present.  Skin:    General: Skin is warm and dry.      Interventions/ Plan   Dose PRN Ativan   appropriately for CIWA  Replace K  500 cc bolus  Continue to Monitor HR       Laneta Gardener- Aram BSN RN CCRN AGACNP-BC Acute Care Nurse Practitioner [facility]

## 2024-01-13 NOTE — Progress Notes (Signed)
 PHARMACY - ANTICOAGULATION CONSULT NOTE  Pharmacy Consult for Heparin  Indication: atrial fibrillation  Allergies[1]  Patient Measurements: Height: 6' 3 (190.5 cm) Weight: 116.6 kg (257 lb) IBW/kg (Calculated) : 84.5 HEPARIN  DW (KG): 108.9  Vital Signs: Temp: 99 F (37.2 C) (12/17 1007) Temp Source: Axillary (12/17 1007) BP: 120/64 (12/17 0945) Pulse Rate: 114 (12/17 0945)  Labs: Recent Labs    01/12/24 1854 01/13/24 0455  HGB 15.7 14.1  HCT 43.2 39.3  PLT 82* 50*  CREATININE 0.91 0.76    Estimated Creatinine Clearance: 140.2 mL/min (by C-G formula based on SCr of 0.76 mg/dL).   Medical History: Past Medical History:  Diagnosis Date   Alcohol  abuse    Anxiety    Depression with anxiety    Hypertension    Pneumonia    Thrombocytopenia     Medications:  (Not in a hospital admission)  Scheduled:   folic acid   1 mg Oral Daily   LORazepam   2 mg Intravenous Q6H   multivitamin with minerals  1 tablet Oral Daily   PHENObarbital   97.5 mg Intravenous Q8H   Followed by   NOREEN ON 01/15/2024] PHENObarbital   65 mg Intravenous Q8H   Followed by   NOREEN ON 01/17/2024] PHENObarbital   32.5 mg Intravenous Q8H   thiamine   100 mg Oral Daily   Or   thiamine   100 mg Intravenous Daily   Infusions:   sodium chloride  75 mL/hr at 01/13/24 0448   amiodarone  60 mg/hr (01/13/24 0941)   Followed by   amiodarone      diltiazem  (CARDIZEM ) infusion 15 mg/hr (01/13/24 0445)   PRN: hydrALAZINE , ibuprofen , LORazepam  **OR** [DISCONTINUED] LORazepam , ondansetron  (ZOFRAN ) IV Anti-infectives (From admission, onward)    None       Assessment: Patient is a 58 yo M presenting with alcohol  withdrawal and fall with left forearm skin laceration. PMH significant for alcohol  abuse, hypertension, depression with anxiety, and thrombocytopenia. Upon presentation, patient was found to be tachycardic and in Afib. Pharmacy has been consulted for Heparin  dosing and monitoring. Patient is not on  any anticoagulation PTA. Platelets are low at baseline. However, per cardiology, okay to start Heparin  despite low platelet count.  Baseline CBC w/ Hgb 14.1 and PLTs 50  Baseline aPTT and INR ordered as stat labs CHADSVASc Score 1   Goal of Therapy:  Heparin  level 0.3-0.7 units/ml Monitor platelets by anticoagulation protocol: Yes   Plan:  - Per cardiology, okay to initiate Heparin  half bolus  - Will order Heparin  2750 units IV half bolus x 1 - Will start Heparin  gtt rate at 1550 units/hr - Will check HL in 6 hrs from infusion start - Will monitor CBC daily    Ransom Blanch PGY-1 Pharmacy Resident  [facility]  01/13/2024 11:29 AM       [1] No Known Allergies

## 2024-01-13 NOTE — Discharge Instructions (Signed)
°  Outpatient Substance Use Treatment Services   [facility] [address] [place], [place] [address]  [phone] Private insurance, Medicare A&B, and [facility]   [facility] ([facility])  [address]  [place], [place] [address] [phone] Medicaid, Self Pay   [facility]      [address]  [place], [place] [phone] Medicaid and [facility], Self Pay   [facility] [address]  [place], [place]  [phone] [facility], and Self Pay  [facility]      [address]    [place], [place] [address]  [phone] or [phone] Private Insurance Only                 [facility] [address]  [place],   [phone] Medicaid, Medicare, Private Insurance  [facility] at [facility] [address]  [place], [place] [address] [phone] Services are free or reduced  [facility]  [address] [phone]  Self Pay only, sliding scale  [facility]  [address]  [place], [place] [address] [phone] ([facility]) Self Pay, Medicaid Only   [facility] [address], [place] [address] [phone] Medicaid, Medicare, Private Insurance     Some PCP options in [place] area- not a comprehensive list  [facility]- [phone] [place]- [phone] [facility]- [phone] [facility]- [phone] [facility]- [phone] Nichole Molly- [phone]  or [facility] Physician Referral Line [phone]

## 2024-01-13 NOTE — Consult Note (Signed)
 WOC Nurse Consult Note: Reason for Consult: Left forearm laceration  Wound type: trauma See image  Pressure Injury POA: NA Measurement:5cm x 3.5cm x 1.0cm with skin flap intact  Wound bed: muscle exposure Drainage (amount, consistency, odor) bloody; not actively bleeding Periwound: intact Dressing procedure/placement/frequency: Attempted to re-approximate, however despite use of benzoin, arm hair made it difficult to maintain approximation. ED MD in to see wound at the time of my visit. Outside of the window of time to suture, will need to heal by secondary intention.  No packing, xeroform and dry dressing. Coban with no tension to hold in place. Patient is somewhat delirious and picking at sheets and dressings, coban will make it less likely to be removed by the patient during acute alcohol  withdrawal.   Discussed POC with patient and bedside nurse.  Re consult if needed, will not follow at this time. Thanks  Jaaziah [employer]ke [employer], RN,CWOCN, CNS, [employer] [phone]

## 2024-01-13 NOTE — ED Notes (Addendum)
 Patient placed in a hospital bed. Bed alarm on with call light in reach.

## 2024-01-13 NOTE — ED Notes (Signed)
 Family at bedside.

## 2024-01-13 NOTE — ED Notes (Signed)
 This RN and Megan NT provided pericare after pt had a episode of stool incontinence. New linen and brief applied. Pt tolerated well. Warm blankets and water given.

## 2024-01-13 NOTE — TOC Initial Note (Signed)
 Transition of Care [facility]) - Initial/Assessment Note    Patient Details  Name: Bobby Williams MRN: [id number] Date of Birth: 1965-07-15  [facility]) CM/SW Contact:    Rayane Gallardo L Timera Windt, LCSW Phone Number: 01/13/2024, 10:45 AM  Clinical Narrative:                  [facility] consult received for substance abuse education/counseling. TOC does not provide education/counseling. Resources have been added to the AVS.        Patient Goals and CMS Choice            Expected Discharge Plan and Services                                              Prior Living Arrangements/Services                       Activities of Daily Living      Permission Sought/Granted                  Emotional Assessment              Admission diagnosis:  Alcohol  withdrawal [facility]) [F10.939] Patient Active Problem List   Diagnosis Date Noted   Atrial fibrillation with RVR (HCC) 01/13/2024   Fall at home, initial encounter with skin laceration in left forearm 01/12/2024   Hypokalemia 01/12/2024   Abnormal LFTs 01/12/2024   HTN (hypertension) 11/30/2023   Alcohol  withdrawal (HCC) 11/29/2023   Depression with anxiety 11/29/2023   Obesity (BMI 30-39.9) 11/29/2023   Thrombocytopenia 11/29/2023   Metabolic acidosis 11/29/2023   Abnormal liver function 11/29/2023   Sinus tachycardia 11/29/2023   Nicotine  dependence 08/22/2020   Need for hepatitis C screening test 08/22/2020   Prostate cancer screening 08/22/2020   Need for Tdap vaccination 08/22/2020   Class 1 obesity due to excess calories with serious comorbidity and body mass index (BMI) of 34.0 to 34.9 in adult 08/22/2020   Anxiety 12/03/2018   Sepsis (HCC) 11/23/2018   Uncontrolled hypertension 11/23/2018   Alcohol  withdrawal syndrome without complication (HCC) 01/22/2016   Rib fracture 01/22/2016   CAP (community acquired pneumonia) 01/22/2016   PCP:  Freddrick, No Pharmacy:   [id number] [place], [place] - [address] [place] [address] Phone: [phone] Fax: [phone]  [id number] [place], [place] - [address] AT [facility] OF [address] [place] [address] Phone: [phone] Fax: [phone]     Social Drivers of Health (SDOH) Social History: SDOH Screenings   Food Insecurity: Unknown (02/24/2023)   Received from [facility]  Utilities: Unknown (02/24/2023)   Received from [facility]  Depression (PHQ2-9): Low Risk (07/08/2021)  Tobacco Use: Medium Risk (01/12/2024)   SDOH Interventions:     Readmission Risk Interventions     No data to display

## 2024-01-13 NOTE — Consult Note (Signed)
 Cardiology Consultation   Patient ID: Bobby Williams MRN: [id number]; DOB: 03-03-1965  Admit date: 01/12/2024 Date of Consult: 01/13/2024  PCP:  Freddrick, No   [facility] Providers Cardiologist:  None        Patient Profile: Bobby Williams is a 58 y.o. male with a hx of hypertension, thrombocytopenia, depression with anxiety, and alcohol  abuse who is being seen 01/13/2024 for the evaluation of new onset atrial fibrillation with RVR at the request of Dr. Hilma.  History of Present Illness: Patient is somnolent on exam and unable to provide history, no family at the bedside.  History is obtained through chart review.  Initially called EMS for a laceration to LFA that occurred 2 days ago.  Reports fall, sliced arm on [place] upon EMS [facility] patient, patient was found to be tachycardiac 170s to 200s and nauseous.  Reported last alcohol  3 days ago.  Stated typically drinks 4-5 bottles of wine daily. In the ED, BP 143/87, HR 162 bpm with otherwise normal vital signs. Pertinent labs include potassium 3.3, mag 2.2, chloride 88, CO2 18, AST 61, ALT 56. Alcohol  undetectable. CXR negative. CT head negative for acute changes. Initial EKG shows atrial fibrillation with PVCs, rate 181 bpm. Patient was given phenobarbital . He was given IV diltiazem  bolus and subsequently started on diltiazem  drip.  Rates remained uncontrolled and he was subsequently started on IV amiodarone .  Cardiology was asked to consult for further evaluation and management of atrial fibrillation with RVR.   Past Medical History:  Diagnosis Date   Alcohol  abuse    Anxiety    Depression with anxiety    Hypertension    Pneumonia    Thrombocytopenia     Past Surgical History:  Procedure Laterality Date   NO PAST SURGERIES         Scheduled Meds:  folic acid   1 mg Oral Daily   LORazepam   2 mg Intravenous Q6H   multivitamin with minerals  1 tablet Oral Daily   PHENObarbital   97.5 mg Intravenous Q8H   Followed by    [START ON 01/15/2024] PHENObarbital   65 mg Intravenous Q8H   Followed by   [START ON 01/17/2024] PHENObarbital   32.5 mg Intravenous Q8H   thiamine   100 mg Oral Daily   Or   thiamine   100 mg Intravenous Daily   Continuous Infusions:  sodium chloride  75 mL/hr at 01/13/24 0448   amiodarone  60 mg/hr (01/13/24 0941)   Followed by   amiodarone      diltiazem  (CARDIZEM ) infusion 15 mg/hr (01/13/24 0445)   heparin  1,550 Units/hr (01/13/24 1145)   PRN Meds: hydrALAZINE , ibuprofen , LORazepam  **OR** [DISCONTINUED] LORazepam , ondansetron  (ZOFRAN ) IV  Allergies:   Allergies[1]  Social History:   Social History   Socioeconomic History   Marital status: Married    Spouse name: Kiimberly   Number of children: 1   Years of education: Not on file   Highest education level: Not on file  Occupational History   Not on file  Tobacco Use   Smoking status: Never   Smokeless tobacco: Former    Types: Chew    Quit date: 08/27/2016   Tobacco comments:    nicotine  Lozenges 4mg  4-6 times  Vaping Use   Vaping status: Never Used  Substance and Sexual Activity   Alcohol  use: Yes    Alcohol /week: 28.0 standard drinks of alcohol     Types: 28 Glasses of wine per week    Comment: 4-5 bottles daily   Drug use: Never  Sexual activity: Yes    Partners: Female  Other Topics Concern   Not on file  Social History Narrative   Right handed      Highest level of edu-bachelors      Lives with wife and one child   Social Drivers of Health   Tobacco Use: Medium Risk (01/12/2024)   Patient History    Smoking Tobacco Use: Never    Smokeless Tobacco Use: Former    Passive Exposure: Not on Actuary Strain: Not on file  Food Insecurity: Unknown (02/24/2023)   Received from [facility]   Epic    Within the past 12 months, you worried that your food would run out before you got money to buy more: Patient declined to answer    Within the past 12 months, the food you bought just didn't  last and you didn't have money to get more. : Patient declined to answer  Transportation Needs: Not on file (02/24/2023)  Physical Activity: Not on file  Stress: Not on file  Social Connections: Not on file  Intimate Partner Violence: Not on file  Depression (PHQ2-9): Low Risk (07/08/2021)   Depression (PHQ2-9)    PHQ-2 Score: 0  Alcohol  Screen: Not on file  Housing: Not on file (02/24/2023)  Utilities: Unknown (02/24/2023)   Received from [facility]   Utilities    In the past 12 months has the electric, gas, oil, or water company threatened to shut off services in your home? : Patient declined to answer  Health Literacy: Not on file    Family History:    Family History  Problem Relation Age of Onset   Heart disease Mother    Kidney cancer Father    Diabetes Neg Hx      ROS:  Please see the history of present illness.   Physical Exam/Data: Vitals:   01/13/24 0925 01/13/24 0945 01/13/24 1007 01/13/24 1120  BP: (!) 139/95 120/64  128/62  Pulse: (!) 116 (!) 114  (!) 104  Resp:  (!) 35    Temp:   99 F (37.2 C)   TempSrc:   Axillary   SpO2:  96%    Weight:      Height:        Intake/Output Summary (Last 24 hours) at 01/13/2024 1158 Last data filed at 01/13/2024 0445 Gross per 24 hour  Intake 409.33 ml  Output --  Net 409.33 ml      01/12/2024    7:00 PM 11/30/2023   12:01 AM 11/29/2023    6:33 PM  Last 3 Weights  Weight (lbs) 257 lb 262 lb 12.6 oz 248 lb 10.9 oz  Weight (kg) 116.574 kg 119.2 kg 112.8 kg     Body mass index is 32.12 kg/m.  General:  Well nourished, well developed, in no acute distress HEENT: normal Neck: Unable to assess JVD due to positioning Vascular: No carotid bruits; Distal pulses 2+ bilaterally Cardiac: IRIR, tachycardic; normal S1, S2; no murmur  Lungs:  clear to auscultation bilaterally, no wheezing, rhonchi or rales  Abd: soft, nontender, no hepatomegaly  Ext: no edema Skin: warm and dry  Psych: Unable to assess, patient  somnolent  EKG:  The EKG was personally reviewed and demonstrates: Atrial fibrillation with PVCs, rate 181 bpm Telemetry:  Telemetry was personally reviewed and demonstrates: Atrial fibrillation with rates 90s to 110s bpm  Relevant CV Studies: Echo pending  Laboratory Data: High Sensitivity Troponin:  No results for input(s): TROPONINIHS in the  last 720 hours. No results for input(s): TRNPT in the last 720 hours.    Chemistry Recent Labs  Lab 01/12/24 1854 01/12/24 1856 01/13/24 0455  NA 136  --  135  K 3.3*  --  4.0  CL 88*  --  98  CO2 18*  --  24  GLUCOSE 119*  --  112*  BUN 11  --  9  CREATININE 0.91  --  0.76  CALCIUM 8.9  --  8.6*  MG  --  2.0  --   GFRNONAA >60  --  >60  ANIONGAP 30*  --  14    Recent Labs  Lab 01/12/24 1854 01/13/24 0455  PROT 7.8 6.8  ALBUMIN 4.7 4.1  AST 61* 48*  ALT 56* 43  ALKPHOS 69 56  BILITOT 1.6* 1.1   Lipids No results for input(s): CHOL, TRIG, HDL, LABVLDL, LDLCALC, CHOLHDL in the last 168 hours.  Hematology Recent Labs  Lab 01/12/24 1854 01/13/24 0455  WBC 10.4 8.9  RBC 4.89 4.41  HGB 15.7 14.1  HCT 43.2 39.3  MCV 88.3 89.1  MCH 32.1 32.0  MCHC 36.3* 35.9  RDW 12.9 13.0  PLT 82* 50*   Thyroid No results for input(s): TSH, FREET4 in the last 168 hours.  BNPNo results for input(s): BNP, PROBNP in the last 168 hours.  DDimer No results for input(s): DDIMER in the last 168 hours.  Radiology/Studies:  CT HEAD WO CONTRAST ( ) Result Date: 01/13/2024 IMPRESSION: 1. No acute intracranial abnormality. 2. Parenchymal volume loss. 3. Periventricular white matter changes consistent with chronic small vessel ischemia. Electronically signed by: Dorethia Molt MD 01/13/2024 03:29 AM EST RP Workstation: [id number]   DG Chest Portable 1 View Result Date: 01/12/2024 IMPRESSION: No active disease. Electronically Signed   By: Vanetta Chou M.D.   On: 01/12/2024 19:33   Assessment and Plan:  Atrial  fibrillation with RVR - Noted on admission with rate up to 180s bpm, unclear chronicity.  No documented history of A-fib. - Given diltiazem  bolus and started on infusion.  Rates still uncontrolled and patient subsequently started on IV amiodarone . - Amiodarone  not a good long term option but may be necessary for rate control at this point, especially if patient is unable to take oral medications - Telemetry shows atrial fibrillation with rates 90s to 110s bpm - Continue diltiazem  and amiodarone  infusion for now for rate control - As patient wakes up and rates improved, would recommend transitioning from amiodarone  to oral metoprolol  for rate control - Recommend starting heparin  in the setting of CHA2DS2-VASc 2.  Will need to monitor blood counts closely and assess for bleeding with heavy alcohol  use. - Echo ordered  Alcohol  withdrawal Abnormal LFTs - Elevated LFTs mostly abuse - CIWA protocol and phenobarbital  tapering per IM  Hypertension - Blood pressure reasonably well-controlled - Recommending home antihypertensives to allow blood pressure room for rate control  Hypokalemia - K 3.3 on admission, repleted  Thrombocytopenia - Platelets trending down from 82 to 50 - Chronic and likely secondary to alcohol   - Continue to monitor closely with initiation of heparin    Risk Assessment/Risk Scores:  This indicates a 2.2% annual risk of stroke. The patient's score is based upon: CHF History: 0 HTN History: 1 Diabetes History: 0 Stroke History: 0 Vascular Disease History: 1 Age Score: 0 Gender Score: 0      For questions or updates, please contact [facility] Please consult [email or web address] for contact info under  Signed, Lesley LITTIE Maffucci, PA-C  01/13/2024 11:58 AM     [1] No Known Allergies

## 2024-01-13 NOTE — Progress Notes (Addendum)
 PROGRESS NOTE    Bobby Williams  FMW:[id number] DOB: [birth year] DOA: 01/12/2024 PCP: Pcp, No  Chief Complaint  Patient presents with   Withdrawal   Extremity Laceration    Hospital Course:  Bobby Williams is a 58 year old male with alcohol  abuse, hypertension, depression, anxiety, thrombocytopenia, who presents with alcohol  withdrawal and a fall as well as a left forearm skin laceration.  Patient was recently admitted 11/2 with alcohol  withdrawal and continues to drink heavily, typically 4 bottles of wine a day.  He reports 2 days prior to arrival he had a fall in the kitchen, denies LOC but did sustain left forearm laceration.  Reports he stopped drinking 2 days prior to arrival and developed tremor, nausea, vomiting.  Arrival to the ED patient was found to be tachycardic with heart rate in the 170s.  Labs with mild electrolyte disturbance, EtOH less than 15.  Patient was found to be in A-fib RVR up to 180.  He was started on metoprolol  and digoxin  at max doses with minimal improvement.  He was then started on amiodarone  and heparin .  Subjective: This morning patient is very drowsy.  Heart rate is still elevated.  Amiodarone  being started.  He does not participate in evaluation   Objective: Vitals:   01/13/24 1330 01/13/24 1400 01/13/24 1442 01/13/24 1445  BP: 125/84  130/88 (!) 138/92  Pulse: (!) 110  (!) 138 (!) 134  Resp: (!) 21     Temp:  97.7 F (36.5 C)    TempSrc:  Oral    SpO2: 97%   97%  Weight:      Height:        Intake/Output Summary (Last 24 hours) at 01/13/2024 1507 Last data filed at 01/13/2024 0445 Gross per 24 hour  Intake 409.33 ml  Output --  Net 409.33 ml   Filed Weights   01/12/24 1900  Weight: 116.6 kg    Examination: General exam: Appears ill, laying in bed.  Sleeping Respiratory system: No work of breathing, symmetric chest wall expansion Cardiovascular system: S1 & S2 heard, rapid Irregular heart rate Gastrointestinal system: Abdomen is  nondistended, soft and nontender.  Neuro: Drowsy, arousable but speech is mumbled and incoherent  Assessment & Plan:  Principal Problem:   Alcohol  withdrawal (HCC) Active Problems:   Tachycardia   Atrial fibrillation with RVR (HCC)   Fall at home, initial encounter with skin laceration in left forearm   HTN (hypertension)   Metabolic acidosis   Hypokalemia   Abnormal LFTs   Thrombocytopenia   Depression with anxiety   Obesity (BMI 30-39.9)   Arm laceration, left, initial encounter    Alcohol  withdrawal - Still tremulous and tachycardic - Admitted to PCU - Continue with phenobarb taper - CIWA protocol - PRN Ativan  - Continue IV fluids  A-fib with RVR - Likely triggered by alcohol  abuse - Rate up to the 180s - Unresponsive to max dose diltiazem  and metoprolol  - Amiodarone  and heparin  started.  Rate now improved to around 110s - Cardiology consulted - Echo ordered and pending.  None prior available for review - Hopefully with patient is for withdrawal and more alert his rate will be better controlled.  Not an excellent candidate for long-term anticoagulation given recurrent alcohol  abuse, thrombocytopenia, and propensity for falls  Fall at home, initial encounter - Fall precautions - Head CT: Without acute intracranial abnormality, periventricular white matter changes consistent with chronic small vessel ischemia - Will obtain PT/OT consults when patient is more alert and  able to tolerate  Skin laceration of left forearm - Out of window for suture - Continue with dressing and Coban to approximate. -- Downtrending plts and active withdrawal, poor candidate for surgical intervention. Will discuss with surgery if he stabilizes.  Hypertension - Controlled with Cardizem  drip currently  Metabolic acidosis - Likely secondary to alcoholic ketoacidosis - Continue IV fluids as above  Hypokalemia - Replace as needed - Trend  Abnormal LFTs - Alk phos 69, AST 61, ALT 56,  total bili 1.6 - Secondary to alcohol  abuse - Improving today  Thrombocytopenia, chronic - Secondary to alcohol  abuse - Trend CBC -- Monitor closely given heparin  gtt  Depression with anxiety - No home meds - Observe.  Too somnolent to discuss at this time  Class I obesity BMI 32 - Outpatient follow up for lifestyle modification and risk factor management   DVT prophylaxis: Heparin  GTT   Code Status: Full Code Disposition:  inpatient pending significant clinical resolution  Consultants:  Treatment Team:  Consulting Physician: Perla Evalene PARAS, MD  Procedures:    Antimicrobials:  Anti-infectives (From admission, onward)    None       Data Reviewed: I have personally reviewed following labs and imaging studies CBC: Recent Labs  Lab 01/12/24 1854 01/13/24 0455  WBC 10.4 8.9  NEUTROABS 8.9*  --   HGB 15.7 14.1  HCT 43.2 39.3  MCV 88.3 89.1  PLT 82* 50*   Basic Metabolic Panel: Recent Labs  Lab 01/12/24 1854 01/12/24 1856 01/13/24 0455  NA 136  --  135  K 3.3*  --  4.0  CL 88*  --  98  CO2 18*  --  24  GLUCOSE 119*  --  112*  BUN 11  --  9  CREATININE 0.91  --  0.76  CALCIUM 8.9  --  8.6*  MG  --  2.0  --   PHOS 3.5  --   --    GFR: Estimated Creatinine Clearance: 140.2 mL/min (by C-G formula based on SCr of 0.76 mg/dL). Liver Function Tests: Recent Labs  Lab 01/12/24 1854 01/13/24 0455  AST 61* 48*  ALT 56* 43  ALKPHOS 69 56  BILITOT 1.6* 1.1  PROT 7.8 6.8  ALBUMIN 4.7 4.1   CBG: No results for input(s): GLUCAP in the last 168 hours.  No results found for this or any previous visit (from the past 240 hours).   Radiology Studies: CT HEAD WO CONTRAST ( ) Result Date: 01/13/2024 EXAM: CT HEAD WITHOUT CONTRAST 01/13/2024 03:23:59 AM TECHNIQUE: CT of the head was performed without the administration of intravenous contrast. Automated exposure control, iterative reconstruction, and/or weight based adjustment of the mA/kV was utilized  to reduce the radiation dose to as low as reasonably achievable. COMPARISON: None available. CLINICAL HISTORY: Head trauma, moderate-severe. FINDINGS: BRAIN AND VENTRICLES: No acute hemorrhage. No evidence of acute infarct. No hydrocephalus. No extra-axial collection. No mass effect or midline shift. Parenchymal volume loss is commensurate with the patient's age. Periventricular white matter changes are present likely reflecting the sequela of small vessel ischemia. ORBITS: No acute abnormality. SINUSES: No acute abnormality. SOFT TISSUES AND SKULL: No acute soft tissue abnormality. No skull fracture. IMPRESSION: 1. No acute intracranial abnormality. 2. Parenchymal volume loss. 3. Periventricular white matter changes consistent with chronic small vessel ischemia. Electronically signed by: Dorethia Molt MD 01/13/2024 03:29 AM EST RP Workstation: [id number]   DG Chest Portable 1 View Result Date: 01/12/2024 CLINICAL DATA:  Palpitation. EXAM: PORTABLE CHEST 1 VIEW  COMPARISON:  Chest radiograph dated 11/29/2023. FINDINGS: The heart size and mediastinal contours are within normal limits. Both lungs are clear. The visualized skeletal structures are unremarkable. IMPRESSION: No active disease. Electronically Signed   By: Vanetta Chou M.D.   On: 01/12/2024 19:33    Scheduled Meds:  folic acid   1 mg Oral Daily   LORazepam   2 mg Intravenous Q6H   metoprolol  tartrate  25 mg Oral Q6H   multivitamin with minerals  1 tablet Oral Daily   PHENObarbital   97.5 mg Intravenous Q8H   Followed by   [START ON 01/15/2024] PHENObarbital   65 mg Intravenous Q8H   Followed by   [START ON 01/17/2024] PHENObarbital   32.5 mg Intravenous Q8H   thiamine   100 mg Oral Daily   Or   thiamine   100 mg Intravenous Daily   Continuous Infusions:  sodium chloride  75 mL/hr at 01/13/24 0448   amiodarone  60 mg/hr (01/13/24 1444)   Followed by   amiodarone  30 mg/hr (01/13/24 1504)   diltiazem  (CARDIZEM ) infusion 15 mg/hr (01/13/24  1441)   heparin  1,550 Units/hr (01/13/24 1145)     LOS: 1 day  MDM: Patient is high risk for one or more organ failure.  They necessitate ongoing hospitalization for continued IV therapies and subsequent lab monitoring. Total time spent interpreting labs and vitals, reviewing the medical record, coordinating care amongst consultants and care team members, directly assessing and discussing care with the patient and/or family: 55 min  Johnson Arizola, DO [facility]  To contact the attending physician between 7A-7P please use Epic Chat. To contact the covering physician during after hours 7P-7A, please review Amion.  01/13/2024, 3:07 PM   *This document has been created with the assistance of dictation software. Please excuse typographical errors. *

## 2024-01-13 NOTE — Progress Notes (Signed)
 PHARMACY - ANTICOAGULATION CONSULT NOTE  Pharmacy Consult for Heparin  Indication: atrial fibrillation  Allergies[1]  Patient Measurements: Height: 6' 3 (190.5 cm) Weight: 116.6 kg (257 lb) IBW/kg (Calculated) : 84.5 HEPARIN  DW (KG): 108.9  Vital Signs: Temp: 98.2 F (36.8 C) (12/17 1606) Temp Source: Oral (12/17 1606) BP: 115/81 (12/17 1800) Pulse Rate: 93 (12/17 1800)  Labs: Recent Labs    01/12/24 1854 01/13/24 0455 01/13/24 1139  HGB 15.7 14.1  --   HCT 43.2 39.3  --   PLT 82* 50*  --   APTT  --   --  29  LABPROT  --   --  13.9  INR  --   --  1.0  CREATININE 0.91 0.76  --     Estimated Creatinine Clearance: 140.2 mL/min (by C-G formula based on SCr of 0.76 mg/dL).   Medical History: Past Medical History:  Diagnosis Date   Alcohol  abuse    Anxiety    Depression with anxiety    Hypertension    Pneumonia    Thrombocytopenia     Medications:  (Not in a hospital admission)  Scheduled:   folic acid   1 mg Oral Daily   LORazepam   2 mg Intravenous Q6H   metoprolol  tartrate  25 mg Oral Q6H   multivitamin with minerals  1 tablet Oral Daily   thiamine   100 mg Oral Daily   Or   thiamine   100 mg Intravenous Daily   Infusions:   sodium chloride  75 mL/hr at 01/13/24 0448   amiodarone  30 mg/hr (01/13/24 1844)   diltiazem  (CARDIZEM ) infusion 15 mg/hr (01/13/24 1547)   heparin  1,550 Units/hr (01/13/24 1145)   PRN: hydrALAZINE , ibuprofen , LORazepam  **OR** [DISCONTINUED] LORazepam , ondansetron  (ZOFRAN ) IV Anti-infectives (From admission, onward)    None       Assessment: Patient is a 58 yo M presenting with alcohol  withdrawal and fall with left forearm skin laceration. PMH significant for alcohol  abuse, hypertension, depression with anxiety, and thrombocytopenia. Upon presentation, patient was found to be tachycardic and in Afib. Pharmacy has been consulted for Heparin  dosing and monitoring. Patient is not on any anticoagulation PTA. Platelets are low at  baseline. However, per cardiology, okay to start Heparin  despite low platelet count.  Baseline CBC w/ Hgb 14.1 and PLTs 50  Baseline aPTT and INR ordered as stat labs CHADSVASc Score 1  12/17 1840 HL: <0.1, subtherapeutic    Goal of Therapy:  Heparin  level 0.3-0.7 units/ml Monitor platelets by anticoagulation protocol: Yes   Plan:  - Heparin  level subtherapeutic  - Give 3000 unit bolus x 1 - Increase heparin  infusion rate to 1800 units/hr - Check heparin  level in 6 hours after rate change - Monitor CBC daily while on heparin    Lum Mania, PharmD, BCPS 01/13/2024 7:19 PM        [1] No Known Allergies

## 2024-01-13 NOTE — ED Notes (Signed)
 Pt repositioned by this RN and NT. Pt tolerated well. Vitals WDL. Lines assessed for patency. Pt declines eating lunch. Lines re flushed.

## 2024-01-13 NOTE — ED Notes (Signed)
 Patient cleaned up after episode of vomiting and leaking from IV tubing. A complete linen change was completed. Patient's brief remains clean at this time. Patient's IV tubing changed. Patient placed in a comfortable position with call light in reach. Bed in lowest position with bed alarm applied. See recent documentation on medications given for nausea/vomiting.

## 2024-01-13 NOTE — ED Notes (Signed)
 Patient resting with eyes closed at this time. Chest rise and fall noted. RR even and unlabored.

## 2024-01-14 ENCOUNTER — Telehealth (HOSPITAL_COMMUNITY): Payer: Self-pay

## 2024-01-14 ENCOUNTER — Other Ambulatory Visit (HOSPITAL_COMMUNITY): Payer: Self-pay

## 2024-01-14 ENCOUNTER — Inpatient Hospital Stay

## 2024-01-14 ENCOUNTER — Inpatient Hospital Stay (HOSPITAL_COMMUNITY)
Admit: 2024-01-14 | Discharge: 2024-01-14 | Disposition: A | Discharge status: Home / Self Care | Attending: Physician Assistant | Admitting: Physician Assistant

## 2024-01-14 DIAGNOSIS — I4891 Unspecified atrial fibrillation: Secondary | ICD-10-CM | POA: Diagnosis not present

## 2024-01-14 DIAGNOSIS — Y92009 Unspecified place in unspecified non-institutional (private) residence as the place of occurrence of the external cause: Secondary | ICD-10-CM | POA: Diagnosis not present

## 2024-01-14 DIAGNOSIS — E872 Acidosis, unspecified: Secondary | ICD-10-CM | POA: Diagnosis not present

## 2024-01-14 DIAGNOSIS — I10 Essential (primary) hypertension: Secondary | ICD-10-CM | POA: Diagnosis not present

## 2024-01-14 DIAGNOSIS — D696 Thrombocytopenia, unspecified: Secondary | ICD-10-CM | POA: Diagnosis not present

## 2024-01-14 DIAGNOSIS — W19XXXA Unspecified fall, initial encounter: Secondary | ICD-10-CM | POA: Diagnosis not present

## 2024-01-14 DIAGNOSIS — F10939 Alcohol use, unspecified with withdrawal, unspecified: Secondary | ICD-10-CM | POA: Diagnosis not present

## 2024-01-14 DIAGNOSIS — S41112A Laceration without foreign body of left upper arm, initial encounter: Secondary | ICD-10-CM | POA: Diagnosis not present

## 2024-01-14 DIAGNOSIS — R7989 Other specified abnormal findings of blood chemistry: Secondary | ICD-10-CM | POA: Diagnosis not present

## 2024-01-14 DIAGNOSIS — F418 Other specified anxiety disorders: Secondary | ICD-10-CM | POA: Diagnosis not present

## 2024-01-14 DIAGNOSIS — R000 Tachycardia, unspecified: Secondary | ICD-10-CM | POA: Diagnosis not present

## 2024-01-14 LAB — CBC
HCT: 37.8 % — ABNORMAL LOW (ref 39.0–52.0)
Hemoglobin: 13.2 g/dL (ref 13.0–17.0)
MCH: 31.7 pg (ref 26.0–34.0)
MCHC: 34.9 g/dL (ref 30.0–36.0)
MCV: 90.9 fL (ref 80.0–100.0)
Platelets: 40 K/uL — ABNORMAL LOW (ref 150–400)
RBC: 4.16 MIL/uL — ABNORMAL LOW (ref 4.22–5.81)
RDW: 12.9 % (ref 11.5–15.5)
WBC: 5.7 K/uL (ref 4.0–10.5)
nRBC: 0 % (ref 0.0–0.2)

## 2024-01-14 LAB — ECHOCARDIOGRAM COMPLETE
Area-P 1/2: 5.02 cm2
Height: 76 in
S' Lateral: 2.6 cm
Weight: 3876.57 [oz_av]

## 2024-01-14 LAB — HEPARIN LEVEL (UNFRACTIONATED): Heparin Unfractionated: 0.19 [IU]/mL — ABNORMAL LOW (ref 0.30–0.70)

## 2024-01-14 MED ORDER — PERFLUTREN LIPID MICROSPHERE
1.0000 mL | INTRAVENOUS | Status: AC | PRN
  Administered 2024-01-14: 14:00:00 3 mL via INTRAVENOUS

## 2024-01-14 MED ORDER — DIGOXIN 0.25 MG/ML IJ SOLN
0.2500 mg | INTRAMUSCULAR | Status: AC
  Administered 2024-01-14 (×2): 0.25 mg via INTRAVENOUS
  Filled 2024-01-14 (×2): qty 2

## 2024-01-14 MED ORDER — HEPARIN BOLUS VIA INFUSION
3300.0000 [IU] | Freq: Once | INTRAVENOUS | Status: AC
  Administered 2024-01-14: 04:00:00 3300 [IU] via INTRAVENOUS
  Filled 2024-01-14: qty 3300

## 2024-01-14 MED ORDER — METOPROLOL TARTRATE 50 MG PO TABS
50.0000 mg | ORAL_TABLET | Freq: Four times a day (QID) | ORAL | Status: DC
  Administered 2024-01-14 – 2024-01-16 (×8): 50 mg via ORAL
  Filled 2024-01-14 (×7): qty 1

## 2024-01-14 NOTE — Plan of Care (Signed)

## 2024-01-14 NOTE — Progress Notes (Signed)
 Rounding Note   Patient Name: Bobby Williams Date of Encounter: 01/14/2024  [facility] Cardiologist: None   Subjective Lethargic this morning, no discomfort Requesting cold water to drink No shortness of breath, no chest pain Remains in atrial fibrillation Rate 100 up to 120 bpm On heparin , amiodarone , diltiazem  infusion Drop in platelets noted 120 down to 40  Scheduled Meds:  folic acid   1 mg Oral Daily   LORazepam   2 mg Intravenous Q6H   metoprolol  tartrate  50 mg Oral Q6H   multivitamin with minerals  1 tablet Oral Daily   thiamine   100 mg Oral Daily   Or   thiamine   100 mg Intravenous Daily   Continuous Infusions:  sodium chloride  75 mL/hr at 01/13/24 0448   diltiazem  (CARDIZEM ) infusion 15 mg/hr (01/14/24 0015)   PRN Meds: hydrALAZINE , ibuprofen , LORazepam  **OR** LORazepam , ondansetron  (ZOFRAN ) IV   Vital Signs  Vitals:   01/14/24 0048 01/14/24 0350 01/14/24 0450 01/14/24 0914  BP: 131/86 116/73 118/78 (!) 132/92  Pulse: (!) 111 (!) 134 (!) 113 (!) 128  Resp: 20 20    Temp: 98.2 F (36.8 C) 98.6 F (37 C)  98.2 F (36.8 C)  TempSrc: Oral     SpO2: 97% 98%  97%  Weight: 109.9 kg     Height: 6' 4 (1.93 m)       Intake/Output Summary (Last 24 hours) at 01/14/2024 1115 Last data filed at 01/14/2024 0600 Gross per 24 hour  Intake 200 ml  Output 200 ml  Net 0 ml      01/14/2024   12:48 AM 01/12/2024    7:00 PM 11/30/2023   12:01 AM  Last 3 Weights  Weight (lbs) 242 lb 4.6 oz 257 lb 262 lb 12.6 oz  Weight (kg) 109.9 kg 116.574 kg 119.2 kg      Telemetry Atrial fibrillation rate 100 up to 120- Personally Reviewed  ECG   - Personally Reviewed  Physical Exam  GEN: No acute distress.  Lethargic Neck: No JVD Cardiac: Irregularly irregular, no murmurs, rubs, or gallops.  Respiratory: Clear to auscultation bilaterally. GI: Soft, nontender, non-distended  MS: No edema; No deformity. Neuro: Encephalopathy Psych: Minimally  conversant  Labs High Sensitivity Troponin:  No results for input(s): TROPONINIHS in the last 720 hours. No results for input(s): TRNPT in the last 720 hours.     Chemistry Recent Labs  Lab 01/12/24 1854 01/12/24 1856 01/13/24 0455  NA 136  --  135  K 3.3*  --  4.0  CL 88*  --  98  CO2 18*  --  24  GLUCOSE 119*  --  112*  BUN 11  --  9  CREATININE 0.91  --  0.76  CALCIUM 8.9  --  8.6*  MG  --  2.0  --   PROT 7.8  --  6.8  ALBUMIN 4.7  --  4.1  AST 61*  --  48*  ALT 56*  --  43  ALKPHOS 69  --  56  BILITOT 1.6*  --  1.1  GFRNONAA >60  --  >60  ANIONGAP 30*  --  14    Lipids No results for input(s): CHOL, TRIG, HDL, LABVLDL, LDLCALC, CHOLHDL in the last 168 hours.  Hematology Recent Labs  Lab 01/12/24 1854 01/13/24 0455 01/14/24 0242  WBC 10.4 8.9 5.7  RBC 4.89 4.41 4.16*  HGB 15.7 14.1 13.2  HCT 43.2 39.3 37.8*  MCV 88.3 89.1 90.9  MCH 32.1  32.0 31.7  MCHC 36.3* 35.9 34.9  RDW 12.9 13.0 12.9  PLT 82* 50* 40*   Thyroid No results for input(s): TSH, FREET4 in the last 168 hours.  BNPNo results for input(s): BNP, PROBNP in the last 168 hours.  DDimer No results for input(s): DDIMER in the last 168 hours.   Radiology  CT HEAD WO CONTRAST ( ) Result Date: 01/13/2024 EXAM: CT HEAD WITHOUT CONTRAST 01/13/2024 03:23:59 AM TECHNIQUE: CT of the head was performed without the administration of intravenous contrast. Automated exposure control, iterative reconstruction, and/or weight based adjustment of the mA/kV was utilized to reduce the radiation dose to as low as reasonably achievable. COMPARISON: None available. CLINICAL HISTORY: Head trauma, moderate-severe. FINDINGS: BRAIN AND VENTRICLES: No acute hemorrhage. No evidence of acute infarct. No hydrocephalus. No extra-axial collection. No mass effect or midline shift. Parenchymal volume loss is commensurate with the patient's age. Periventricular white matter changes are present likely reflecting  the sequela of small vessel ischemia. ORBITS: No acute abnormality. SINUSES: No acute abnormality. SOFT TISSUES AND SKULL: No acute soft tissue abnormality. No skull fracture. IMPRESSION: 1. No acute intracranial abnormality. 2. Parenchymal volume loss. 3. Periventricular white matter changes consistent with chronic small vessel ischemia. Electronically signed by: Dorethia Molt MD 01/13/2024 03:29 AM EST RP Workstation: [id number]   DG Chest Portable 1 View Result Date: 01/12/2024 CLINICAL DATA:  Palpitation. EXAM: PORTABLE CHEST 1 VIEW COMPARISON:  Chest radiograph dated 11/29/2023. FINDINGS: The heart size and mediastinal contours are within normal limits. Both lungs are clear. The visualized skeletal structures are unremarkable. IMPRESSION: No active disease. Electronically Signed   By: Vanetta Chou M.D.   On: 01/12/2024 19:33    Cardiac Studies   Patient Profile   RAHUL MALINAK is a 58 y.o. male with a hx of hypertension, thrombocytopenia, depression with anxiety, and alcohol  abuse who is being seen 01/13/2024 for the evaluation of new onset atrial fibrillation with RVR   Assessment and plan Atrial fibrillation with RVR In the setting of alcohol  abuse, alcohol  withdrawal -Rapid rate on arrival On CIWA protocol for alcohol  withdrawal -Recommend we discontinue amiodarone  as he is not on long-term anticoagulation -Hold heparin  infusion given drop in platelets -Continue diltiazem  infusion - So far tolerating metoprolol  to tartrate 25 every 6 hours, would recommend we increase up to 50 mg every 6 - Currently not a candidate for TEE cardioversion   Alcohol  abuse/withdrawal Long history of alcohol  abuse Currently on CIWA protocol, sedated   Essential hypertension Continue diltiazem  infusion -Metoprolol   tartrate up to 50 every 6 hours   Thrombocytopenia In the setting of alcohol  abuse, liver dysfunction with elevated LFTs -Cessation recommended - Will hold heparin  infusion -  High risk of bleeding, unable to exclude esophageal varices and other complications from chronic alcohol  abuse     For questions or updates, please contact  [facility] Please consult [email or web address] for contact info under     Signed, Shaniece Bussa, MD  01/14/2024, 11:15 AM

## 2024-01-14 NOTE — Progress Notes (Signed)
 PHARMACY - ANTICOAGULATION CONSULT NOTE  Pharmacy Consult for Heparin  Indication: atrial fibrillation  Allergies[1]  Patient Measurements: Height: 6' 4 (193 cm) Weight: 109.9 kg (242 lb 4.6 oz) IBW/kg (Calculated) : 86.8 HEPARIN  DW (KG): 108.9  Vital Signs: Temp: 98.2 F (36.8 C) (12/18 0048) Temp Source: Oral (12/18 0048) BP: 131/86 (12/18 0048) Pulse Rate: 111 (12/18 0048)  Labs: Recent Labs    01/12/24 1854 01/13/24 0455 01/13/24 1139 01/13/24 1840 01/14/24 0242  HGB 15.7 14.1  --   --  13.2  HCT 43.2 39.3  --   --  37.8*  PLT 82* 50*  --   --  40*  APTT  --   --  29  --   --   LABPROT  --   --  13.9  --   --   INR  --   --  1.0  --   --   HEPARINUNFRC  --   --   --  <0.10* 0.19*  CREATININE 0.91 0.76  --   --   --     Estimated Creatinine Clearance: 138.3 mL/min (by C-G formula based on SCr of 0.76 mg/dL).   Medical History: Past Medical History:  Diagnosis Date   Alcohol  abuse    Anxiety    Depression with anxiety    Hypertension    Pneumonia    Thrombocytopenia     Medications:  Medications Prior to Admission  Medication Sig Dispense Refill Last Dose/Taking   amLODipine  (NORVASC ) 10 MG tablet 1 TABLET DAILY AT 8:00 AM (Patient not taking: Reported on 01/12/2024) 30 tablet 3 Not Taking   buPROPion  (WELLBUTRIN ) 75 MG tablet Take 1 tablet (75 mg total) by mouth 2 (two) times daily. (Patient not taking: Reported on 01/12/2024) 60 tablet 1 Not Taking   chlorthalidone  (HYGROTON ) 25 MG tablet TAKE 1 TABLET (25 MG TOTAL) BY MOUTH DAILY. (Patient not taking: Reported on 01/12/2024) 90 tablet 0 Not Taking   folic acid  (FOLVITE ) 1 MG tablet Take 1 tablet (1 mg total) by mouth daily. (Patient not taking: Reported on 01/12/2024) 30 tablet 1 Not Taking   Multiple Vitamin (MULTIVITAMIN WITH MINERALS) TABS tablet Take 1 tablet by mouth daily. (Patient not taking: Reported on 01/12/2024) 30 tablet 1 Not Taking   thiamine  (VITAMIN B-1) 100 MG tablet Take 1 tablet (100  mg total) by mouth daily. (Patient not taking: Reported on 01/12/2024) 30 tablet 1 Not Taking   traZODone  (DESYREL ) 100 MG tablet TAKE 1 TABLET BY MOUTH EVERYDAY AT BEDTIME (Patient not taking: Reported on 01/12/2024) 90 tablet 3 Not Taking   Scheduled:   folic acid   1 mg Oral Daily   heparin   3,300 Units Intravenous Once   LORazepam   2 mg Intravenous Q6H   metoprolol  tartrate  25 mg Oral Q6H   multivitamin with minerals  1 tablet Oral Daily   thiamine   100 mg Oral Daily   Or   thiamine   100 mg Intravenous Daily   Infusions:   sodium chloride  75 mL/hr at 01/13/24 0448   amiodarone  30 mg/hr (01/13/24 2355)   diltiazem  (CARDIZEM ) infusion 15 mg/hr (01/14/24 0015)   heparin  1,800 Units/hr (01/13/24 2358)   PRN: hydrALAZINE , ibuprofen , LORazepam  **OR** LORazepam , ondansetron  (ZOFRAN ) IV Anti-infectives (From admission, onward)    None       Assessment: Patient is a 58 yo M presenting with alcohol  withdrawal and fall with left forearm skin laceration. PMH significant for alcohol  abuse, hypertension, depression with anxiety, and thrombocytopenia. Upon presentation,  patient was found to be tachycardic and in Afib. Pharmacy has been consulted for Heparin  dosing and monitoring. Patient is not on any anticoagulation PTA. Platelets are low at baseline. However, per cardiology, okay to start Heparin  despite low platelet count.  Baseline CBC w/ Hgb 14.1 and PLTs 50  Baseline aPTT and INR ordered as stat labs CHADSVASc Score 1  12/17 1840 HL: <0.1, subtherapeutic  12/18 0242 HL:   0.19, SUBtherapeutic    Goal of Therapy:  Heparin  level 0.3-0.7 units/ml Monitor platelets by anticoagulation protocol: Yes   Plan:  12/18:  HL @ 0242 = 0.19, SUBtherapeutic  - will order heparin  3300 units IV X 1 bolus and increase drip rate to 2200 units/hr  - Check heparin  level in 6 hours after rate change - Monitor CBC daily while on heparin   Caylen Kuwahara D 01/14/2024 3:08 AM         [1] No  Known Allergies

## 2024-01-14 NOTE — Telephone Encounter (Signed)
 Pharmacy Patient Advocate Encounter  Insurance verification completed.    The patient is insured through [facility] MEDICAID.     Ran test claim for Xarelto 20mg  tablet and the current 30 day co-pay is $4.  Ran test claim for Eliquis 5mg  tablet and the current 30 day co-pay is $4.  This test claim was processed through [employer]- copay amounts may vary at other pharmacies due to [employer], or as the patient moves through the different stages of their insurance plan.

## 2024-01-14 NOTE — TOC CM/SW Note (Signed)
 Transition of Care [facility]) - Inpatient Brief Assessment   Patient Details  Name: Bobby Williams MRN: [id number] Date of Birth: 1966-01-20  Transition of [facility]) CM/SW Contact:    Shasta DELENA Daring, RN Phone Number: 01/14/2024, 10:09 AM   Clinical Narrative: Chart reviewed. PCP resources added to AVS by RNCM.  SA resources added by another provider.  No additional TOC needs. Will follow for needs development.   Transition of Care Asessment: Insurance and Status: Insurance coverage has been reviewed Patient has primary care physician: No Home environment has been reviewed: single family home Prior level of function:: independent   Social Drivers of Health Review: SDOH reviewed no interventions necessary Readmission risk has been reviewed: Yes Transition of care needs: no transition of care needs at this time

## 2024-01-14 NOTE — Progress Notes (Signed)
 PROGRESS NOTE    Bobby Williams  FMW:[id number] DOB: 04/10/1965 DOA: 01/12/2024 PCP: Pcp, No  Chief Complaint  Patient presents with   Withdrawal   Extremity Laceration    Hospital Course:  Bobby Williams is a 58 year old male with alcohol  abuse, hypertension, depression, anxiety, thrombocytopenia, who presents with alcohol  withdrawal and a fall as well as a left forearm skin laceration.  Patient was recently admitted 11/2 with alcohol  withdrawal and continues to drink heavily, typically 4 bottles of wine a day.  He reports 2 days prior to arrival he had a fall in the kitchen, denies LOC but did sustain left forearm laceration.  Reports he stopped drinking 2 days prior to arrival and developed tremor, nausea, vomiting.  Arrival to the ED patient was found to be tachycardic with heart rate in the 170s.  Labs with mild electrolyte disturbance, EtOH less than 15.  Patient was found to be in A-fib RVR up to 180.  He was started on metoprolol  and digoxin  at max doses with minimal improvement.  He was then started on amiodarone  and heparin .  Subjective: No acute events overnight.  On evaluation this morning patient is still very drowsy, minimally participatory in exam Heart rate still elevated 110s  Objective: Vitals:   01/14/24 0048 01/14/24 0350 01/14/24 0450 01/14/24 0914  BP: 131/86 116/73 118/78 (!) 132/92  Pulse: (!) 111 (!) 134 (!) 113 (!) 128  Resp: 20 20    Temp: 98.2 F (36.8 C) 98.6 F (37 C)  98.2 F (36.8 C)  TempSrc: Oral     SpO2: 97% 98%  97%  Weight: 109.9 kg     Height: 6' 4 (1.93 m)       Intake/Output Summary (Last 24 hours) at 01/14/2024 1200 Last data filed at 01/14/2024 0600 Gross per 24 hour  Intake 200 ml  Output 200 ml  Net 0 ml   Filed Weights   01/12/24 1900 01/14/24 0048  Weight: 116.6 kg 109.9 kg    Examination: General exam: Appears ill, laying in bed.  Sleeping Respiratory system: No work of breathing, symmetric chest wall  expansion Cardiovascular system: S1 & S2 heard, rapid Irregular heart rate Gastrointestinal system: Abdomen is nondistended, soft and nontender.  Neuro: Drowsy, arousable but speech is mumbled and incoherent  Assessment & Plan:  Principal Problem:   Alcohol  withdrawal (HCC) Active Problems:   Tachycardia   Atrial fibrillation with RVR (HCC)   Fall at home, initial encounter with skin laceration in left forearm   HTN (hypertension)   Metabolic acidosis   Hypokalemia   Abnormal LFTs   Thrombocytopenia   Depression with anxiety   Obesity (BMI 30-39.9)   Arm laceration, left, initial encounter    Alcohol  withdrawal - Still tremulous and tachycardic - Have discontinued phenobarb taper which I believe was over sedating - Continue with scheduled Ativan  2 mg every 6 hours - Continue close monitoring - Continue telemetry - Continue CIWA protocol with additional Ativan  as needed - Continue IV fluids  A-fib with RVR - Likely triggered by alcohol  abuse - Rate up to the 180s initially - Unresponsive to max dose diltiazem  metoprolol  - Was started on heparin  and amiodarone  which improved rate initially but platelets appear to be dropping.  Now down to 40 - Heparin  and amiodarone  discontinued this morning - Metoprolol  increased - Monitor blood pressure closely.  Currently tolerating well. - Cardiology still following, considering diltiazem  if needed - Not a candidate for TEE cardioversion - Echocardiogram ordered and  pending, none prior available for review - Expect further resolution as withdrawal improves  Fall at home, initial encounter - Fall precautions - Head CT: Without acute intracranial abnormality, periventricular white matter changes consistent with chronic small vessel ischemia - Will obtain PT/OT consults when patient is more alert and able to tolerate  Skin laceration of left forearm - Out of window for suture - Continue with dressing and Coban to approximate. --  Downtrending plts and active withdrawal, poor candidate for surgical intervention. Will discuss with surgery if he stabilizes.  Hypertension - Controlled with Cardizem  drip currently  Metabolic acidosis - Likely secondary to alcoholic ketoacidosis - Continue IV fluids as above  Hypokalemia - Replace as needed - Trend  Abnormal LFTs Presumed cirrhosis - Fatty infiltration of the liver seen on RUQ US  in 2022.  No imaging since then - Alk phos 69, AST 61, ALT 56, total bili 1.6 - Secondary to alcohol  abuse - LFTs resolving now - Will need outpatient follow-up with GI  Thrombocytopenia, chronic - Has been gradually downtrending from 120 to 40 over the last 1 month. - Has dropped 50% over the last 48 hours.  Stop heparin  drip - Continue to monitor CBC  Depression with anxiety - No home meds - Observe.  Too somnolent to discuss at this time  Class I obesity BMI 32 - Outpatient follow up for lifestyle modification and risk factor management   DVT prophylaxis: SCDs   Code Status: Full Code Disposition:  inpatient pending significant clinical resolution  Consultants:  Treatment Team:  Consulting Physician: Perla Evalene PARAS, MD  Procedures:    Antimicrobials:  Anti-infectives (From admission, onward)    None       Data Reviewed: I have personally reviewed following labs and imaging studies CBC: Recent Labs  Lab 01/12/24 1854 01/13/24 0455 01/14/24 0242  WBC 10.4 8.9 5.7  NEUTROABS 8.9*  --   --   HGB 15.7 14.1 13.2  HCT 43.2 39.3 37.8*  MCV 88.3 89.1 90.9  PLT 82* 50* 40*   Basic Metabolic Panel: Recent Labs  Lab 01/12/24 1854 01/12/24 1856 01/13/24 0455  NA 136  --  135  K 3.3*  --  4.0  CL 88*  --  98  CO2 18*  --  24  GLUCOSE 119*  --  112*  BUN 11  --  9  CREATININE 0.91  --  0.76  CALCIUM 8.9  --  8.6*  MG  --  2.0  --   PHOS 3.5  --   --    GFR: Estimated Creatinine Clearance: 138.3 mL/min (by C-G formula based on SCr of 0.76  mg/dL). Liver Function Tests: Recent Labs  Lab 01/12/24 1854 01/13/24 0455  AST 61* 48*  ALT 56* 43  ALKPHOS 69 56  BILITOT 1.6* 1.1  PROT 7.8 6.8  ALBUMIN 4.7 4.1   CBG: No results for input(s): GLUCAP in the last 168 hours.  No results found for this or any previous visit (from the past 240 hours).   Radiology Studies: CT HEAD WO CONTRAST ( ) Result Date: 01/13/2024 EXAM: CT HEAD WITHOUT CONTRAST 01/13/2024 03:23:59 AM TECHNIQUE: CT of the head was performed without the administration of intravenous contrast. Automated exposure control, iterative reconstruction, and/or weight based adjustment of the mA/kV was utilized to reduce the radiation dose to as low as reasonably achievable. COMPARISON: None available. CLINICAL HISTORY: Head trauma, moderate-severe. FINDINGS: BRAIN AND VENTRICLES: No acute hemorrhage. No evidence of acute infarct. No hydrocephalus. No  extra-axial collection. No mass effect or midline shift. Parenchymal volume loss is commensurate with the patient's age. Periventricular white matter changes are present likely reflecting the sequela of small vessel ischemia. ORBITS: No acute abnormality. SINUSES: No acute abnormality. SOFT TISSUES AND SKULL: No acute soft tissue abnormality. No skull fracture. IMPRESSION: 1. No acute intracranial abnormality. 2. Parenchymal volume loss. 3. Periventricular white matter changes consistent with chronic small vessel ischemia. Electronically signed by: Dorethia Molt MD 01/13/2024 03:29 AM EST RP Workstation: [id number]   DG Chest Portable 1 View Result Date: 01/12/2024 CLINICAL DATA:  Palpitation. EXAM: PORTABLE CHEST 1 VIEW COMPARISON:  Chest radiograph dated 11/29/2023. FINDINGS: The heart size and mediastinal contours are within normal limits. Both lungs are clear. The visualized skeletal structures are unremarkable. IMPRESSION: No active disease. Electronically Signed   By: Vanetta Chou M.D.   On: 01/12/2024 19:33     Scheduled Meds:  folic acid   1 mg Oral Daily   LORazepam   2 mg Intravenous Q6H   metoprolol  tartrate  50 mg Oral Q6H   multivitamin with minerals  1 tablet Oral Daily   thiamine   100 mg Oral Daily   Or   thiamine   100 mg Intravenous Daily   Continuous Infusions:  sodium chloride  75 mL/hr at 01/13/24 0448   diltiazem  (CARDIZEM ) infusion 15 mg/hr (01/14/24 0015)     LOS: 2 days  MDM: Patient is high risk for one or more organ failure.  They necessitate ongoing hospitalization for continued IV therapies and subsequent lab monitoring. Total time spent interpreting labs and vitals, reviewing the medical record, coordinating care amongst consultants and care team members, directly assessing and discussing care with the patient and/or family: 55 min  Claris Guymon, DO [facility]  To contact the attending physician between 7A-7P please use Epic Chat. To contact the covering physician during after hours 7P-7A, please review Amion.  01/14/2024, 12:00 PM   *This document has been created with the assistance of dictation software. Please excuse typographical errors. *

## 2024-01-15 DIAGNOSIS — R000 Tachycardia, unspecified: Secondary | ICD-10-CM | POA: Diagnosis not present

## 2024-01-15 DIAGNOSIS — F10939 Alcohol use, unspecified with withdrawal, unspecified: Secondary | ICD-10-CM | POA: Diagnosis not present

## 2024-01-15 DIAGNOSIS — S41112A Laceration without foreign body of left upper arm, initial encounter: Secondary | ICD-10-CM | POA: Diagnosis not present

## 2024-01-15 DIAGNOSIS — E872 Acidosis, unspecified: Secondary | ICD-10-CM | POA: Diagnosis not present

## 2024-01-15 DIAGNOSIS — R7989 Other specified abnormal findings of blood chemistry: Secondary | ICD-10-CM | POA: Diagnosis not present

## 2024-01-15 DIAGNOSIS — F418 Other specified anxiety disorders: Secondary | ICD-10-CM | POA: Diagnosis not present

## 2024-01-15 DIAGNOSIS — W19XXXA Unspecified fall, initial encounter: Secondary | ICD-10-CM | POA: Diagnosis not present

## 2024-01-15 DIAGNOSIS — Y92009 Unspecified place in unspecified non-institutional (private) residence as the place of occurrence of the external cause: Secondary | ICD-10-CM | POA: Diagnosis not present

## 2024-01-15 DIAGNOSIS — D696 Thrombocytopenia, unspecified: Secondary | ICD-10-CM | POA: Diagnosis not present

## 2024-01-15 DIAGNOSIS — I4891 Unspecified atrial fibrillation: Secondary | ICD-10-CM | POA: Diagnosis not present

## 2024-01-15 LAB — MAGNESIUM: Magnesium: 2.1 mg/dL (ref 1.7–2.4)

## 2024-01-15 LAB — COMPREHENSIVE METABOLIC PANEL WITH GFR
ALT: 57 U/L — ABNORMAL HIGH (ref 0–44)
AST: 60 U/L — ABNORMAL HIGH (ref 15–41)
Albumin: 3.9 g/dL (ref 3.5–5.0)
Alkaline Phosphatase: 65 U/L (ref 38–126)
Anion gap: 16 — ABNORMAL HIGH (ref 5–15)
BUN: 7 mg/dL (ref 6–20)
CO2: 23 mmol/L (ref 22–32)
Calcium: 8.6 mg/dL — ABNORMAL LOW (ref 8.9–10.3)
Chloride: 92 mmol/L — ABNORMAL LOW (ref 98–111)
Creatinine, Ser: 0.6 mg/dL — ABNORMAL LOW (ref 0.61–1.24)
GFR, Estimated: >60 mL/min
Glucose, Bld: 85 mg/dL (ref 70–99)
Potassium: 3.1 mmol/L — ABNORMAL LOW (ref 3.5–5.1)
Sodium: 131 mmol/L — ABNORMAL LOW (ref 135–145)
Total Bilirubin: 1 mg/dL (ref 0.0–1.2)
Total Protein: 6.7 g/dL (ref 6.5–8.1)

## 2024-01-15 LAB — CBC
HCT: 40.1 % (ref 39.0–52.0)
Hemoglobin: 14.2 g/dL (ref 13.0–17.0)
MCH: 31.4 pg (ref 26.0–34.0)
MCHC: 35.4 g/dL (ref 30.0–36.0)
MCV: 88.7 fL (ref 80.0–100.0)
Platelets: 53 K/uL — ABNORMAL LOW (ref 150–400)
RBC: 4.52 MIL/uL (ref 4.22–5.81)
RDW: 12.4 % (ref 11.5–15.5)
WBC: 6.8 K/uL (ref 4.0–10.5)
nRBC: 0 % (ref 0.0–0.2)

## 2024-01-15 LAB — PHOSPHORUS: Phosphorus: 2.3 mg/dL — ABNORMAL LOW (ref 2.5–4.6)

## 2024-01-15 MED ORDER — POTASSIUM PHOSPHATES 15 MMOLE/5ML IV SOLN
15.0000 mmol | Freq: Once | INTRAVENOUS | Status: AC
  Administered 2024-01-15: 15 mmol via INTRAVENOUS
  Filled 2024-01-15: qty 5

## 2024-01-15 MED ORDER — POTASSIUM CHLORIDE CRYS ER 20 MEQ PO TBCR
40.0000 meq | EXTENDED_RELEASE_TABLET | Freq: Once | ORAL | Status: AC
  Administered 2024-01-15: 40 meq via ORAL
  Filled 2024-01-15: qty 2

## 2024-01-15 MED ORDER — DIGOXIN 250 MCG PO TABS
0.2500 mg | ORAL_TABLET | Freq: Every day | ORAL | Status: DC
  Administered 2024-01-15 – 2024-01-19 (×5): 0.25 mg via ORAL
  Filled 2024-01-15 (×5): qty 1

## 2024-01-15 NOTE — Progress Notes (Signed)
 " PROGRESS NOTE    Bobby Williams  FMW:[id number] DOB: 1965/09/12 DOA: 01/12/2024 PCP: Pcp, No  Chief Complaint  Patient presents with   Withdrawal   Extremity Laceration    Hospital Course:  Bobby Williams is a 58 year old male with alcohol  abuse, hypertension, depression, anxiety, thrombocytopenia, who presents with alcohol  withdrawal and a fall as well as a left forearm skin laceration.  Patient was recently admitted 11/2 with alcohol  withdrawal and continues to drink heavily, typically 4 bottles of wine a day.  He reports 2 days prior to arrival he had a fall in the kitchen, denies LOC but did sustain left forearm laceration.  Reports he stopped drinking 2 days prior to arrival and developed tremor, nausea, vomiting.  Arrival to the ED patient was found to be tachycardic with heart rate in the 170s.  Labs with mild electrolyte disturbance, EtOH less than 15.  Patient was found to be in A-fib RVR up to 180.  He was started on metoprolol  and digoxin  at max doses with minimal improvement.  He was then started on amiodarone  and heparin .  Subjective: No acute events overnight.  Still tachycardic but improving gradually. On evaluation today patient much more alert.  He is leaning on edge of bed.  He reports generally feeling unwell.  He endorses persistent nausea and generalized weakness.  Objective: Vitals:   01/14/24 2308 01/15/24 0024 01/15/24 0354 01/15/24 0603  BP: (!) 141/86 (!) 141/86 (!) 150/111 (!) 150/111  Pulse: (!) 103 98 (!) 123 (!) 112  Resp:  18 18   Temp:  98.4 F (36.9 C) 98.4 F (36.9 C)   TempSrc:      SpO2:  98% 97%   Weight:      Height:        Intake/Output Summary (Last 24 hours) at 01/15/2024 0832 Last data filed at 01/15/2024 0817 Gross per 24 hour  Intake 240 ml  Output 1350 ml  Net -1110 ml   Filed Weights   01/12/24 1900 01/14/24 0048  Weight: 116.6 kg 109.9 kg    Examination: General exam: Uncomfortable appearing, appears ill. Respiratory  system: No work of breathing, symmetric chest wall expansion Cardiovascular system: S1 & S2 heard, rapid irregular heart rate Gastrointestinal system: Abdomen is nondistended, soft and nontender.  Neuro:, Oriented.  Assessment & Plan:  Principal Problem:   Alcohol  withdrawal (HCC) Active Problems:   Tachycardia   Atrial fibrillation with RVR (HCC)   Fall at home, initial encounter with skin laceration in left forearm   HTN (hypertension)   Metabolic acidosis   Hypokalemia   Abnormal LFTs   Thrombocytopenia   Depression with anxiety   Obesity (BMI 30-39.9)   Arm laceration, left, initial encounter    Alcohol  withdrawal Alcohol  abuse - Daily alcohol : intake 4 bottles of wine - Still tremulous and tachycardic -Continue with scheduled Ativan , taper as tolerated - Continue telemetry monitoring - Continue CIWA, additional Ativan  as needed  A-fib with RVR - Likely triggered by alcohol  abuse - Rate up to the 180s initially - Unresponsive to max dose diltiazem   - Was started on heparin  and amiodarone  which improved rate initially but platelets appear to be dropping.  Nadir at 40  12/18. - Heparin  and amiodarone  discontinued 12/18, digoxin  started. Metoprolol  increased - Pressure tolerating. - Not a candidate for TEE cardioversion - Echocardiogram EF 55 to 60%, no regional wall motion abnormalities, diastolic parameters indeterminate.   - Expect further resolution as withdrawal improves - Allergy still following, appreciate  recommendations - Continue monitoring on telemetry  Fall at home, initial encounter - Fall precautions - Head CT: Without acute intracranial abnormality, periventricular white matter changes consistent with chronic small vessel ischemia - Will obtain PT/OT consults when patient is more alert and able to tolerate  Skin laceration of left forearm - Out of window for suture - Continue with dressing and Coban to approximate. -- Downtrending plts and active  withdrawal, poor candidate for surgical intervention.   Hypertension - Controlled.  Continue to monitor  Metabolic acidosis - Likely secondary to alcoholic ketoacidosis --Resolved  Hypokalemia -Continue to replace as needed  Hypophosphatemia - replace today IV - Continue to replace as needed  Abnormal LFTs Presumed cirrhosis - Fatty infiltration of the liver seen on RUQ US  in 2022.  No imaging since then - Alk phos 69, AST 61, ALT 56, total bili 1.6 - Secondary to alcohol  abuse - LFTs resolving now - Will need outpatient follow-up with GI for liver surveillance  Thrombocytopenia, chronic - Has been gradually downtrending from 120 to 40 over the last 1 month. - Dropped 50% with initiation of heparin  drip.  Heparin  was discontinued.  Platelets stabilizing now ~53 - Continue monitoring CBC  Depression with anxiety - No home meds  Class I obesity BMI 32 - Outpatient follow up for lifestyle modification and risk factor management   DVT prophylaxis: SCDs   Code Status: Full Code Disposition: Heart rate still elevated, on IV meds.  Unstable.  Inpatient  Consultants:  Treatment Team:  Consulting Physician: Perla Evalene PARAS, MD  Procedures:    Antimicrobials:  Anti-infectives (From admission, onward)    None       Data Reviewed: I have personally reviewed following labs and imaging studies CBC: Recent Labs  Lab 01/12/24 1854 01/13/24 0455 01/14/24 0242 01/15/24 0400  WBC 10.4 8.9 5.7 6.8  NEUTROABS 8.9*  --   --   --   HGB 15.7 14.1 13.2 14.2  HCT 43.2 39.3 37.8* 40.1  MCV 88.3 89.1 90.9 88.7  PLT 82* 50* 40* 53*   Basic Metabolic Panel: Recent Labs  Lab 01/12/24 1854 01/12/24 1856 01/13/24 0455 01/15/24 0400  NA 136  --  135 131*  K 3.3*  --  4.0 3.1*  CL 88*  --  98 92*  CO2 18*  --  24 23  GLUCOSE 119*  --  112* 85  BUN 11  --  9 7  CREATININE 0.91  --  0.76 0.60*  CALCIUM 8.9  --  8.6* 8.6*  MG  --  2.0  --  2.1  PHOS 3.5  --   --   2.3*   GFR: Estimated Creatinine Clearance: 138.3 mL/min (A) (by C-G formula based on SCr of 0.6 mg/dL (L)). Liver Function Tests: Recent Labs  Lab 01/12/24 1854 01/13/24 0455 01/15/24 0400  AST 61* 48* 60*  ALT 56* 43 57*  ALKPHOS 69 56 65  BILITOT 1.6* 1.1 1.0  PROT 7.8 6.8 6.7  ALBUMIN 4.7 4.1 3.9   CBG: No results for input(s): GLUCAP in the last 168 hours.  No results found for this or any previous visit (from the past 240 hours).   Radiology Studies: ECHOCARDIOGRAM COMPLETE Result Date: 01/14/2024    ECHOCARDIOGRAM REPORT   Patient Name:   FERDINANDO LODGE Rho Date of Exam: 01/14/2024 Medical Rec #:  [id number]      Height:       76.0 in Accession #:    [id number]  Weight:       242.3 lb Date of Birth:  16-Sep-1965      BSA:          2.403 m Patient Age:    58 years       BP:           137/100 mmHg Patient Gender: M              HR:           74 bpm. Exam Location:  [facility] Procedure: 2D Echo, Cardiac Doppler, Color Doppler and Intracardiac            Opacification Agent (Both Spectral and Color Flow Doppler were            utilized during procedure). Indications:     Atrial fibrillation  History:         Patient has no prior history of Echocardiogram examinations.                  Arrythmias:Atrial Fibrillation; Risk Factors:Hypertension.  Sonographer:     Philomena Daring Referring Phys:  [id number] [facility] Diagnosing Phys: Evalene Lunger MD  Sonographer Comments: Image acquisition challenging due to respiratory motion. IMPRESSIONS  1. Left ventricular ejection fraction, by estimation, is 55 to 60%. The left ventricle has normal function. The left ventricle has no regional wall motion abnormalities. Left ventricular diastolic parameters are indeterminate.  2. Right ventricular systolic function is normal. The right ventricular size is normal. Tricuspid regurgitation signal is inadequate for assessing PA pressure.  3. The mitral valve is normal in structure. No evidence of mitral valve  regurgitation. No evidence of mitral stenosis.  4. The aortic valve is normal in structure. Aortic valve regurgitation is not visualized. No aortic stenosis is present.  5. The inferior vena cava is normal in size with greater than 50% respiratory variability, suggesting right atrial pressure of 3 mmHg.  6. Rhythm is atrial fibrillation FINDINGS  Left Ventricle: Left ventricular ejection fraction, by estimation, is 55 to 60%. The left ventricle has normal function. The left ventricle has no regional wall motion abnormalities. Definity  contrast agent was given IV to delineate the left ventricular  endocardial borders. Strain was performed and the global longitudinal strain is indeterminate. The left ventricular internal cavity size was normal in size. There is no left ventricular hypertrophy. Left ventricular diastolic parameters are indeterminate. Right Ventricle: The right ventricular size is normal. No increase in right ventricular wall thickness. Right ventricular systolic function is normal. Tricuspid regurgitation signal is inadequate for assessing PA pressure. Left Atrium: Left atrial size was normal in size. Right Atrium: Right atrial size was normal in size. Pericardium: There is no evidence of pericardial effusion. Mitral Valve: The mitral valve is normal in structure. No evidence of mitral valve regurgitation. No evidence of mitral valve stenosis. Tricuspid Valve: The tricuspid valve is normal in structure. Tricuspid valve regurgitation is not demonstrated. No evidence of tricuspid stenosis. Aortic Valve: The aortic valve is normal in structure. Aortic valve regurgitation is not visualized. No aortic stenosis is present. Pulmonic Valve: The pulmonic valve was normal in structure. Pulmonic valve regurgitation is not visualized. No evidence of pulmonic stenosis. Aorta: The aortic root is normal in size and structure. Venous: The inferior vena cava is normal in size with greater than 50% respiratory  variability, suggesting right atrial pressure of 3 mmHg. IAS/Shunts: No atrial level shunt detected by color flow Doppler. Additional Comments: 3D was performed not requiring image post  processing on an independent workstation and was indeterminate.  LEFT VENTRICLE PLAX 2D LVIDd:         3.80 cm   Diastology LVIDs:         2.60 cm   LV e' medial:    11.30 cm/s LV PW:         1.00 cm   LV E/e' medial:  7.9 LV IVS:        1.20 cm   LV e' lateral:   14.40 cm/s LVOT diam:     2.10 cm   LV E/e' lateral: 6.2 LV SV:         46 LV SV Index:   19 LVOT Area:     3.46 cm  RIGHT VENTRICLE             IVC RV S prime:     10.20 cm/s  IVC diam: 2.00 cm TAPSE (M-mode): 2.1 cm LEFT ATRIUM             Index        RIGHT ATRIUM           Index LA diam:        3.20 cm 1.33 cm/m   RA Area:     18.60 cm LA Vol (A2C):   47.0 ml 19.56 ml/m  RA Volume:   45.60 ml  18.97 ml/m LA Vol (A4C):   39.6 ml 16.48 ml/m LA Biplane Vol: 42.9 ml 17.85 ml/m  AORTIC VALVE LVOT Vmax:   95.40 cm/s LVOT Vmean:  63.900 cm/s LVOT VTI:    0.133 m  AORTA Ao Root diam: 3.00 cm MITRAL VALVE MV Area (PHT): 5.02 cm    SHUNTS MV Decel Time: 151 msec    Systemic VTI:  0.13 m MV E velocity: 89.10 cm/s  Systemic Diam: 2.10 cm Evalene Lunger MD Electronically signed by Evalene Lunger MD Signature Date/Time: 01/14/2024/2:24:41 PM    Final     Scheduled Meds:  digoxin   0.25 mg Oral Daily   folic acid   1 mg Oral Daily   LORazepam   2 mg Intravenous Q6H   metoprolol  tartrate  50 mg Oral Q6H   multivitamin with minerals  1 tablet Oral Daily   thiamine   100 mg Oral Daily   Or   thiamine   100 mg Intravenous Daily   Continuous Infusions:  sodium chloride  75 mL/hr at 01/14/24 1412   diltiazem  (CARDIZEM ) infusion 15 mg/hr (01/15/24 0817)     LOS: 3 days  MDM: Patient is high risk for one or more organ failure.  They necessitate ongoing hospitalization for continued IV therapies and subsequent lab monitoring. Total time spent interpreting labs and vitals,  reviewing the medical record, coordinating care amongst consultants and care team members, directly assessing and discussing care with the patient and/or family: 55 min  Jaxson Keener, DO [facility]  To contact the attending physician between 7A-7P please use Epic Chat. To contact the covering physician during after hours 7P-7A, please review Amion.  01/15/2024, 8:32 AM   *This document has been created with the assistance of dictation software. Please excuse typographical errors. *   "

## 2024-01-15 NOTE — Plan of Care (Signed)

## 2024-01-15 NOTE — Evaluation (Signed)
 Physical Therapy Evaluation Patient Details Name: Bobby Williams MRN: [id number] DOB: Jul 16, 1965 Today's Date: 01/15/2024  History of Present Illness  Pt is a 58 y.o. male with medical history significant of  alcohol  abuse, hypertension, depression with anxiety, thrombocytopenia, presents with alcohol  withdrawal and fall, left forearm skin laceration. MD assessment includes: alcohol  withdrawal, A-fib, fall at home, skin laceration on left forearm.   Clinical Impression  Pt was cleared as appropriate to work with per nursing. Pt was lying bed on arrival, was pleasant and motivated to participate during the session and put forth good effort throughout. Pts HR was monitored throughout (details reported below), and SpO2 WNL throughout on RA. Pt was very impulsive and had a heavy posterior bias when standing to RW. Pt required min A for transferring to standing and mod A for ambulation (see details below). Pt required max cueing for sequencing and hand placement, and pacing. He tends to go full speed with little regard for his overall unsteadiness. Pt did have posterior LOB while standing and during ambulation. He is interested in getting sober and stated he would be very receptive to any community resources, child psychotherapist notified. Pt will benefit from continued PT services upon discharge to safely address deficits listed in patient problem list for decreased caregiver assistance and eventual return to PLOF.        If plan is discharge home, recommend the following: Assistance with cooking/housework;Assist for transportation   Can travel by private vehicle   Yes    Equipment Recommendations Rolling walker (2 wheels)  Recommendations for Other Services       Functional Status Assessment Patient has had a recent decline in their functional status and demonstrates the ability to make significant improvements in function in a reasonable and predictable amount of time.     Precautions /  Restrictions Precautions Precautions: Fall Recall of Precautions/Restrictions: Impaired Restrictions Weight Bearing Restrictions Per Provider Order: No      Mobility  Bed Mobility Overal bed mobility: Needs Assistance Bed Mobility: Supine to Sit     Supine to sit: Supervision          Transfers Overall transfer level: Needs assistance Equipment used: Rolling walker (2 wheels) Transfers: Sit to/from Stand Sit to Stand: Min assist           General transfer comment: cues for safety and hand placement, pulled walker towards him on the way up, moves very fast and is impulsive    Ambulation/Gait Ambulation/Gait assistance: Mod assist Gait Distance (Feet): 200 Feet Assistive device: Rolling walker (2 wheels) Gait Pattern/deviations: Step-through pattern Gait velocity: normal to fast     General Gait Details: very unsteady, heavy posterior lean with LOB throughout ambulation, required max cueing for safety, walker placement, sequencing. He let the walker get ahead of him and rounded turns very quickly  Careers Information Officer     Tilt Bed    Modified Rankin (Stroke Patients Only)       Balance Overall balance assessment: Needs assistance Sitting-balance support: Feet supported Sitting balance-Leahy Scale: Good   Postural control: Posterior lean Standing balance support: Reliant on assistive device for balance, During functional activity, Single extremity supported Standing balance-Leahy Scale: Poor Standing balance comment: constant external assist to prevent LOB posteriorly, swaying side to side at times  Pertinent Vitals/Pain Pain Assessment Pain Assessment: Faces Faces Pain Scale: Hurts a little bit Pain Descriptors / Indicators: Discomfort Pain Intervention(s): Repositioned, Monitored during session    Home Living Family/patient expects to be discharged to:: Private residence Living  Arrangements: Alone Available Help at Discharge: Family;Other (Comment)  Type of Home: House Home Access: Stairs to enter Entrance Stairs-Rails: Left;Right;Can reach both Entrance Stairs-Number of Steps: 4   Home Layout: One level Home Equipment: None      Prior Function Prior Level of Function : Driving             Mobility Comments: community amb, driving and running 5 miles prior to being admitted ADLs Comments: ind     Extremity/Trunk Assessment   Upper Extremity Assessment Upper Extremity Assessment: Defer to OT evaluation    Lower Extremity Assessment Lower Extremity Assessment: Generalized weakness       Communication   Communication Communication: No apparent difficulties    Cognition Arousal: Alert Behavior During Therapy: Restless, Impulsive   PT - Cognitive impairments: No apparent impairments                         Following commands: Impaired Following commands impaired: Follows one step commands inconsistently, Follows one step commands with increased time     Cueing Cueing Techniques: Verbal cues, Gestural cues, Tactile cues, Visual cues     General Comments General comments (skin integrity, edema, etc.): HR up to 118 with acitvity and back down to 90s with rest in the chair    Exercises Other Exercises Other Exercises: edu on role of PT in the acute care setting, benefits of mobility and dc recs   Assessment/Plan    PT Assessment Patient needs continued PT services  PT Problem List Decreased strength;Decreased range of motion;Decreased activity tolerance;Decreased balance;Decreased mobility;Decreased knowledge of use of DME;Decreased knowledge of precautions;Decreased safety awareness;Pain       PT Treatment Interventions DME instruction;Gait training;Stair training;Therapeutic activities;Functional mobility training;Therapeutic exercise;Balance training;Patient/family education    PT Goals (Current goals can be found in the  Care Plan section)  Acute Rehab PT Goals Patient Stated Goal: getting back to better health. PT Goal Formulation: With patient Time For Goal Achievement: 01/28/24 Potential to Achieve Goals: Fair    Frequency Min 2X/week     Co-evaluation               AM-PAC PT 6 Clicks Mobility  Outcome Measure Help needed turning from your back to your side while in a flat bed without using bedrails?: A Little Help needed moving from lying on your back to sitting on the side of a flat bed without using bedrails?: A Little Help needed moving to and from a bed to a chair (including a wheelchair)?: A Little Help needed standing up from a chair using your arms (e.g., wheelchair or bedside chair)?: A Little Help needed to walk in hospital room?: A Lot Help needed climbing 3-5 steps with a railing? : A Lot 6 Click Score: 16    End of Session Equipment Utilized During Treatment: Gait belt Activity Tolerance: Patient tolerated treatment well Patient left: in chair;with call bell/phone within reach;with chair alarm set Nurse Communication: Mobility status PT Visit Diagnosis: Unsteadiness on feet (R26.81);Difficulty in walking, not elsewhere classified (R26.2);History of falling (Z91.81)    Time: 8647-8572 PT Time Calculation (min) (ACUTE ONLY): 35 min   Charges:  Corean Newport, SPT 01/15/2024, 4:47 PM

## 2024-01-15 NOTE — Progress Notes (Signed)
 "  Rounding Note   Patient Name: Bobby Williams Date of Encounter: 01/15/2024  [facility] Cardiologist: None   Subjective He remains in atrial fibrillation with ventricular rate ranging from 110 to 120 bpm.  Scheduled Meds:  digoxin   0.25 mg Oral Daily   folic acid   1 mg Oral Daily   LORazepam   2 mg Intravenous Q6H   metoprolol  tartrate  50 mg Oral Q6H   multivitamin with minerals  1 tablet Oral Daily   thiamine   100 mg Oral Daily   Or   thiamine   100 mg Intravenous Daily   Continuous Infusions:  sodium chloride  75 mL/hr at 01/14/24 1412   diltiazem  (CARDIZEM ) infusion 15 mg/hr (01/15/24 0817)   potassium PHOSPHATE  IVPB (in mmol)     PRN Meds: hydrALAZINE , ibuprofen , LORazepam  **OR** LORazepam , ondansetron  (ZOFRAN ) IV   Vital Signs  Vitals:   01/15/24 0354 01/15/24 0603 01/15/24 1136 01/15/24 1202  BP: (!) 150/111 (!) 150/111 120/79   Pulse: (!) 123 (!) 112 92 (!) 108  Resp: 18     Temp: 98.4 F (36.9 C)     TempSrc:      SpO2: 97%     Weight:      Height:        Intake/Output Summary (Last 24 hours) at 01/15/2024 1304 Last data filed at 01/15/2024 0939 Gross per 24 hour  Intake 0 ml  Output 1350 ml  Net -1350 ml      01/14/2024   12:48 AM 01/12/2024    7:00 PM 11/30/2023   12:01 AM  Last 3 Weights  Weight (lbs) 242 lb 4.6 oz 257 lb 262 lb 12.6 oz  Weight (kg) 109.9 kg 116.574 kg 119.2 kg      Telemetry Atrial fibrillation rate 100 up to 120- Personally Reviewed  ECG   - Personally Reviewed  Physical Exam  GEN: No acute distress.   Neck: No JVD Cardiac: Irregularly irregular, no murmurs, rubs, or gallops.  Respiratory: Clear to auscultation bilaterally. GI: Soft, nontender, non-distended  MS: No edema; No deformity. Neuro: Encephalopathy Psych: Minimally conversant  Labs High Sensitivity Troponin:  No results for input(s): TROPONINIHS in the last 720 hours. No results for input(s): TRNPT in the last 720 hours.      Chemistry Recent Labs  Lab 01/12/24 1854 01/12/24 1856 01/13/24 0455 01/15/24 0400  NA 136  --  135 131*  K 3.3*  --  4.0 3.1*  CL 88*  --  98 92*  CO2 18*  --  24 23  GLUCOSE 119*  --  112* 85  BUN 11  --  9 7  CREATININE 0.91  --  0.76 0.60*  CALCIUM 8.9  --  8.6* 8.6*  MG  --  2.0  --  2.1  PROT 7.8  --  6.8 6.7  ALBUMIN 4.7  --  4.1 3.9  AST 61*  --  48* 60*  ALT 56*  --  43 57*  ALKPHOS 69  --  56 65  BILITOT 1.6*  --  1.1 1.0  GFRNONAA >60  --  >60 >60  ANIONGAP 30*  --  14 16*    Lipids No results for input(s): CHOL, TRIG, HDL, LABVLDL, LDLCALC, CHOLHDL in the last 168 hours.  Hematology Recent Labs  Lab 01/13/24 0455 01/14/24 0242 01/15/24 0400  WBC 8.9 5.7 6.8  RBC 4.41 4.16* 4.52  HGB 14.1 13.2 14.2  HCT 39.3 37.8* 40.1  MCV 89.1 90.9 88.7  MCH 32.0 31.7 31.4  MCHC 35.9 34.9 35.4  RDW 13.0 12.9 12.4  PLT 50* 40* 53*   Thyroid No results for input(s): TSH, FREET4 in the last 168 hours.  BNPNo results for input(s): BNP, PROBNP in the last 168 hours.  DDimer No results for input(s): DDIMER in the last 168 hours.   Radiology  ECHOCARDIOGRAM COMPLETE Result Date: 01/14/2024    ECHOCARDIOGRAM REPORT   Patient Name:   Bobby Williams Date of Exam: 01/14/2024 Medical Rec #:  [id number]      Height:       76.0 in Accession #:    [id number]     Weight:       242.3 lb Date of Birth:  06/11/1965      BSA:          2.403 m Patient Age:    58 years       BP:           137/100 mmHg Patient Gender: M              HR:           74 bpm. Exam Location:  [facility] Procedure: 2D Echo, Cardiac Doppler, Color Doppler and Intracardiac            Opacification Agent (Both Spectral and Color Flow Doppler were            utilized during procedure). Indications:     Atrial fibrillation  History:         Patient has no prior history of Echocardiogram examinations.                  Arrythmias:Atrial Fibrillation; Risk Factors:Hypertension.  Sonographer:     Philomena Daring  Referring Phys:  [id number] [facility] L CAREY Diagnosing Phys: Evalene Lunger MD  Sonographer Comments: Image acquisition challenging due to respiratory motion. IMPRESSIONS  1. Left ventricular ejection fraction, by estimation, is 55 to 60%. The left ventricle has normal function. The left ventricle has no regional wall motion abnormalities. Left ventricular diastolic parameters are indeterminate.  2. Right ventricular systolic function is normal. The right ventricular size is normal. Tricuspid regurgitation signal is inadequate for assessing PA pressure.  3. The mitral valve is normal in structure. No evidence of mitral valve regurgitation. No evidence of mitral stenosis.  4. The aortic valve is normal in structure. Aortic valve regurgitation is not visualized. No aortic stenosis is present.  5. The inferior vena cava is normal in size with greater than 50% respiratory variability, suggesting right atrial pressure of 3 mmHg.  6. Rhythm is atrial fibrillation FINDINGS  Left Ventricle: Left ventricular ejection fraction, by estimation, is 55 to 60%. The left ventricle has normal function. The left ventricle has no regional wall motion abnormalities. Definity  contrast agent was given IV to delineate the left ventricular  endocardial borders. Strain was performed and the global longitudinal strain is indeterminate. The left ventricular internal cavity size was normal in size. There is no left ventricular hypertrophy. Left ventricular diastolic parameters are indeterminate. Right Ventricle: The right ventricular size is normal. No increase in right ventricular wall thickness. Right ventricular systolic function is normal. Tricuspid regurgitation signal is inadequate for assessing PA pressure. Left Atrium: Left atrial size was normal in size. Right Atrium: Right atrial size was normal in size. Pericardium: There is no evidence of pericardial effusion. Mitral Valve: The mitral valve is normal in structure. No evidence of mitral  valve regurgitation. No evidence of mitral  valve stenosis. Tricuspid Valve: The tricuspid valve is normal in structure. Tricuspid valve regurgitation is not demonstrated. No evidence of tricuspid stenosis. Aortic Valve: The aortic valve is normal in structure. Aortic valve regurgitation is not visualized. No aortic stenosis is present. Pulmonic Valve: The pulmonic valve was normal in structure. Pulmonic valve regurgitation is not visualized. No evidence of pulmonic stenosis. Aorta: The aortic root is normal in size and structure. Venous: The inferior vena cava is normal in size with greater than 50% respiratory variability, suggesting right atrial pressure of 3 mmHg. IAS/Shunts: No atrial level shunt detected by color flow Doppler. Additional Comments: 3D was performed not requiring image post processing on an independent workstation and was indeterminate.  LEFT VENTRICLE PLAX 2D LVIDd:         3.80 cm   Diastology LVIDs:         2.60 cm   LV e' medial:    11.30 cm/s LV PW:         1.00 cm   LV E/e' medial:  7.9 LV IVS:        1.20 cm   LV e' lateral:   14.40 cm/s LVOT diam:     2.10 cm   LV E/e' lateral: 6.2 LV SV:         46 LV SV Index:   19 LVOT Area:     3.46 cm  RIGHT VENTRICLE             IVC RV S prime:     10.20 cm/s  IVC diam: 2.00 cm TAPSE (M-mode): 2.1 cm LEFT ATRIUM             Index        RIGHT ATRIUM           Index LA diam:        3.20 cm 1.33 cm/m   RA Area:     18.60 cm LA Vol (A2C):   47.0 ml 19.56 ml/m  RA Volume:   45.60 ml  18.97 ml/m LA Vol (A4C):   39.6 ml 16.48 ml/m LA Biplane Vol: 42.9 ml 17.85 ml/m  AORTIC VALVE LVOT Vmax:   95.40 cm/s LVOT Vmean:  63.900 cm/s LVOT VTI:    0.133 m  AORTA Ao Root diam: 3.00 cm MITRAL VALVE MV Area (PHT): 5.02 cm    SHUNTS MV Decel Time: 151 msec    Systemic VTI:  0.13 m MV E velocity: 89.10 cm/s  Systemic Diam: 2.10 cm Evalene Lunger MD Electronically signed by Evalene Lunger MD Signature Date/Time: 01/14/2024/2:24:41 PM    Final     Cardiac  Studies Echocardiogram yesterday showed normal LV systolic function.  Patient Profile   Bobby Williams is a 58 y.o. male with a hx of hypertension, thrombocytopenia, depression with anxiety, and alcohol  abuse who was seen 01/13/2024 for the evaluation of new onset atrial fibrillation with RVR   Assessment and plan Atrial fibrillation with RVR In the setting of alcohol  abuse, alcohol  withdrawal -Rapid rate on arrival On CIWA protocol for alcohol  withdrawal. - CHADS2 Vascor is 1 and I agree that the risks of anticoagulation outweigh the benefit in the setting of excessive alcohol  use and thrombocytopenia. - Continue rate control.  I agree with increasing metoprolol  to 50 mg every 6 hours and continuing digoxin .  Hopefully this will allow weaning off diltiazem  drip.  No plans for rhythm control. SPECT improvement in atrial fibrillation rate as his overall condition improves.   Alcohol  abuse/withdrawal Long  history of alcohol  abuse Currently on CIWA protocol.  Essential hypertension Agree with stopping chlorthalidone  to allow up titration of rate control medications.  Thrombocytopenia In the setting of alcohol  abuse, liver dysfunction with elevated LFTs     For questions or updates, please contact [facility] Please consult [email or web address] for contact info under     Signed, Deatrice Cage, MD  01/15/2024, 1:04 PM    "

## 2024-01-15 NOTE — Evaluation (Signed)
 Occupational Therapy Evaluation Patient Details Name: Bobby Williams MRN: [id number] DOB: 06/17/1965 Today's Date: 01/15/2024   History of Present Illness   Pt is a 58 y.o. male with medical history significant of  alcohol  abuse, hypertension, depression with anxiety, thrombocytopenia, presents with alcohol  withdrawal and fall, left forearm skin laceration. MD assessment includes: alcohol  withdrawal, A-fib, fall at home, skin laceration on left forearm.     Clinical Impressions Pt was seen for OT evaluation this date. PTA, pt lives home alone with intermittent family assist/support. He reports being IND at baseline, was working ~1 month ago and running 5 miles per day. Pt presents with deficits in strength, balance, activity tolerance and safety awareness, judgement  limiting their ability to perform ADL management at baseline level. Pt currently requires Min/CGA for STS from recliner with cues for hand placement and step by step cues for safety. Ambulated with Min/mod A using RW for ADL transfer to bathroom using low toilet and grab bar to stand. CGA to Min A for seated peri-care via lateral leans. Very poor safety awareness and judgement with swaying and posterior bias in standing, very high fall risk. Pt would benefit from skilled OT services to address noted impairments and functional limitations to maximize safety and independence while minimizing future risk of falls, injury, and readmission. Do anticipate the need for follow up OT services upon acute hospital DC.      If plan is discharge home, recommend the following:   A little help with bathing/dressing/bathroom;A lot of help with walking and/or transfers;A lot of help with bathing/dressing/bathroom;A little help with walking and/or transfers;Assistance with cooking/housework;Help with stairs or ramp for entrance     Functional Status Assessment   Patient has had a recent decline in their functional status and demonstrates the  ability to make significant improvements in function in a reasonable and predictable amount of time.     Equipment Recommendations   Other (comment) (defer)     Recommendations for Other Services         Precautions/Restrictions   Precautions Precautions: Fall Recall of Precautions/Restrictions: Impaired Restrictions Weight Bearing Restrictions Per Provider Order: No     Mobility Bed Mobility Overal bed mobility: Needs Assistance Bed Mobility: Sit to Supine       Sit to supine: Supervision, Contact guard assist        Transfers Overall transfer level: Needs assistance Equipment used: Rolling walker (2 wheels) Transfers: Sit to/from Stand Sit to Stand: Contact guard assist, Min assist           General transfer comment: cues for safety and hand placement and ambulated with RW with Min/Mod A for safety with multimodal step by step cueing d/t poor safety awareness      Balance Overall balance assessment: Needs assistance Sitting-balance support: Feet supported Sitting balance-Leahy Scale: Fair   Postural control: Posterior lean Standing balance support: Reliant on assistive device for balance, Bilateral upper extremity supported Standing balance-Leahy Scale: Poor Standing balance comment: constant +1 external assist to prevent LOB d/t swaying and posterior bias                           ADL either performed or assessed with clinical judgement   ADL Overall ADL's : Needs assistance/impaired                         Toilet Transfer: Minimal assistance;Rolling walker (2 wheels);Regular Toilet;Grab bars  Toileting- Clothing Manipulation and Hygiene: Contact guard assist;Sitting/lateral lean;Minimal assistance       Functional mobility during ADLs: Minimal assistance;Moderate assistance;Rolling walker (2 wheels);Cueing for safety       Vision         Perception         Praxis         Pertinent Vitals/Pain Pain  Assessment Pain Assessment: No/denies pain     Extremity/Trunk Assessment Upper Extremity Assessment Upper Extremity Assessment: Generalized weakness   Lower Extremity Assessment Lower Extremity Assessment: Generalized weakness       Communication Communication Communication: No apparent difficulties   Cognition Arousal: Alert Behavior During Therapy: Restless, Impulsive                                 Following commands: Impaired Following commands impaired: Follows one step commands inconsistently, Follows one step commands with increased time     Cueing  General Comments   Cueing Techniques: Verbal cues;Gestural cues;Tactile cues;Visual cues  HR up to 133 with activity during session back down into 90s with return to bed   Exercises Other Exercises Other Exercises: Edu on role of OT in acute setting.   Shoulder Instructions      Home Living Family/patient expects to be discharged to:: Private residence Living Arrangements: Alone Available Help at Discharge: Family;Other (Comment) Type of Home: House Home Access: Stairs to enter [employer] of Steps: 4 Entrance Stairs-Rails: Left;Right;Can reach both Home Layout: One level     Bathroom Shower/Tub: Chief Strategy Officer: Standard Bathroom Accessibility: Yes   Home Equipment: None          Prior Functioning/Environment Prior Level of Function : Driving             Mobility Comments: community amb, driving and running 5 miles prior to being admitted ADLs Comments: ind    OT Problem List: Decreased strength;Decreased activity tolerance;Impaired balance (sitting and/or standing);Decreased safety awareness   OT Treatment/Interventions: Self-care/ADL training;Therapeutic exercise;Patient/family education;Balance training;Therapeutic activities;DME and/or AE instruction      OT Goals(Current goals can be found in the care plan section)   Acute Rehab OT  Goals Patient Stated Goal: get better OT Goal Formulation: With patient Time For Goal Achievement: 01/29/24 Potential to Achieve Goals: Good ADL Goals Pt Will Perform Grooming: with supervision;standing Pt Will Perform Lower Body Dressing: with supervision;with modified independence;sitting/lateral leans;sit to/from stand Pt Will Transfer to Toilet: with supervision;ambulating   OT Frequency:  Min 2X/week    Co-evaluation              AM-PAC OT 6 Clicks Daily Activity     Outcome Measure Help from another person eating meals?: A Little Help from another person taking care of personal grooming?: A Little Help from another person toileting, which includes using toliet, bedpan, or urinal?: A Lot Help from another person bathing (including washing, rinsing, drying)?: A Lot Help from another person to put on and taking off regular upper body clothing?: A Little Help from another person to put on and taking off regular lower body clothing?: A Lot 6 Click Score: 15   End of Session Equipment Utilized During Treatment: Gait belt;Rolling walker (2 wheels) Nurse Communication: Mobility status  Activity Tolerance: Patient tolerated treatment well Patient left: with call bell/phone within reach;in bed;with bed alarm set;with family/visitor present  OT Visit Diagnosis: Other abnormalities of gait and mobility (R26.89);Unsteadiness on feet (R26.81);Muscle  weakness (generalized) (M62.81)                Time: 8542-8473 OT Time Calculation (min): 29 min Charges:  OT General Charges $OT Visit: 1 Visit OT Evaluation $OT Eval Moderate Complexity: 1 Mod OT Treatments $Self Care/Home Management : 8-22 mins Leopoldo Mazzie Chrismon, OTR/L  01/15/2024, 4:09 PM  Abdul Beirne E Chrismon 01/15/2024, 4:05 PM

## 2024-01-16 DIAGNOSIS — I4891 Unspecified atrial fibrillation: Secondary | ICD-10-CM | POA: Diagnosis not present

## 2024-01-16 DIAGNOSIS — S41112A Laceration without foreign body of left upper arm, initial encounter: Secondary | ICD-10-CM | POA: Diagnosis not present

## 2024-01-16 DIAGNOSIS — F10939 Alcohol use, unspecified with withdrawal, unspecified: Secondary | ICD-10-CM | POA: Diagnosis not present

## 2024-01-16 DIAGNOSIS — R000 Tachycardia, unspecified: Secondary | ICD-10-CM | POA: Diagnosis not present

## 2024-01-16 MED ORDER — METOPROLOL TARTRATE 50 MG PO TABS
75.0000 mg | ORAL_TABLET | Freq: Four times a day (QID) | ORAL | Status: DC
  Administered 2024-01-16 – 2024-01-19 (×12): 75 mg via ORAL
  Filled 2024-01-16 (×12): qty 1

## 2024-01-16 MED ORDER — DILTIAZEM HCL 30 MG PO TABS
60.0000 mg | ORAL_TABLET | Freq: Four times a day (QID) | ORAL | Status: DC
  Administered 2024-01-16 – 2024-01-19 (×11): 60 mg via ORAL
  Filled 2024-01-16 (×11): qty 2

## 2024-01-16 MED ORDER — LORAZEPAM 2 MG/ML IJ SOLN
1.0000 mg | Freq: Four times a day (QID) | INTRAMUSCULAR | Status: DC
  Administered 2024-01-16 – 2024-01-20 (×15): 1 mg via INTRAVENOUS
  Filled 2024-01-16 (×15): qty 1

## 2024-01-16 MED ORDER — LOPERAMIDE HCL 2 MG PO CAPS
2.0000 mg | ORAL_CAPSULE | ORAL | Status: DC | PRN
  Administered 2024-01-16: 2 mg via ORAL
  Filled 2024-01-16: qty 1

## 2024-01-16 NOTE — Progress Notes (Signed)
 " PROGRESS NOTE    Bobby Williams  FMW:[id number] DOB: [birth year] DOA: 01/12/2024 PCP: Pcp, No  Chief Complaint  Patient presents with   Withdrawal   Extremity Laceration    Hospital Course:  Bobby Williams is a 58 year old male with alcohol  abuse, hypertension, depression, anxiety, thrombocytopenia, who presents with alcohol  withdrawal and a fall as well as a left forearm skin laceration.  Patient was recently admitted 11/2 with alcohol  withdrawal and continues to drink heavily, typically 4 bottles of wine a day.  He reports 2 days prior to arrival he had a fall in the kitchen, denies LOC but did sustain left forearm laceration.  Reports he stopped drinking 2 days prior to arrival and developed tremor, nausea, vomiting.  Arrival to the ED patient was found to be tachycardic with heart rate in the 170s.  Labs with mild electrolyte disturbance, EtOH less than 15.  Patient was found to be in A-fib RVR up to 180.  He was started on metoprolol  and digoxin  at max doses with minimal improvement.  He was then started on amiodarone  and heparin .  Heparin  was discontinued his platelets down trended to 40.  He was started on oral diltiazem  and metoprolol  12/20.  Subjective: No acute events overnight.  Patient is been persistently tachycardic.  He denies any acute issues at this time, denies chest pain.  He reports he is of sound mind today.  He is enjoying having breakfast.  Objective: Vitals:   01/16/24 0503 01/16/24 0754 01/16/24 0950 01/16/24 1237  BP: 111/68 (!) 153/95 120/82 105/77  Pulse: 94 89 (!) 131 (!) 118  Resp:  18  (!) 21  Temp:  97.8 F (36.6 C)  97.9 F (36.6 C)  TempSrc:    Oral  SpO2:  99%  97%  Weight:      Height:        Intake/Output Summary (Last 24 hours) at 01/16/2024 1411 Last data filed at 01/16/2024 0800 Gross per 24 hour  Intake 3612.5 ml  Output 1975 ml  Net 1637.5 ml   Filed Weights   01/12/24 1900 01/14/24 0048  Weight: 116.6 kg 109.9 kg     Examination: General exam: Awake, alert, comfortable appearing Respiratory system: No work of breathing, symmetric chest wall expansion Cardiovascular system: S1 & S2 heard, rapid irregular heart rate Gastrointestinal system: Abdomen is nondistended, soft and nontender.  Neuro: Alert, oriented.  Assessment & Plan:  Principal Problem:   Alcohol  withdrawal (HCC) Active Problems:   Tachycardia   Atrial fibrillation with RVR (HCC)   Fall at home, initial encounter with skin laceration in left forearm   HTN (hypertension)   Metabolic acidosis   Hypokalemia   Abnormal LFTs   Thrombocytopenia   Depression with anxiety   Obesity (BMI 30-39.9)   Arm laceration, left, initial encounter    Alcohol  withdrawal Alcohol  abuse - Daily alcohol : 4 bottles of wine - Intermittently delirious but much improved.  Requires frequent reorientation by bedside RN - Currently on Ativan  2 mg every 6 hours, will begin slow taper - Continue telemetry - Continue CIWA  A-fib with RVR - Likely triggered by alcohol  abuse - Rate up to the 180s initially - Unresponsive to max dose diltiazem   - Was started on heparin  and amiodarone  which improved rate initially but platelets appear to be dropping.  Nadir at 40  12/18. - Heparin  and amiodarone  discontinued 12/18, digoxin  started. Metoprolol  increased - Diltiazem  drip converted to p.o. today.  Continue with digoxin  and metoprolol .  Blood pressure appears to be tolerating - Cardiology following - Not a candidate for TEE cardioversion - Echocardiogram EF 55 to 60%, no regional wall motion abnormalities, diastolic parameters indeterminate.   - Expect further resolution as withdrawal improves -Continue monitoring on telemetry  Fall at home, initial encounter - Fall precautions - Head CT: Without acute intracranial abnormality, periventricular white matter changes consistent with chronic small vessel ischemia - PT/OT recommending SNF.  Patient is in  agreement.  COC consulted  Skin laceration of left forearm - Out of window for suture - Continue with dressing and Coban to approximate. -- Downtrending plts and active withdrawal, poor candidate for surgical intervention.   Hypertension - Controlled.  Continue to monitor  Metabolic acidosis - Likely secondary to alcoholic ketoacidosis --Resolved  Hypokalemia -Continue to replace as needed  Hypophosphatemia -Status post replacement - Continue to replace as needed - Repeat labs in a.m.  Abnormal LFTs Presumed cirrhosis - Fatty infiltration of the liver seen on RUQ US  in 2022.  No imaging since then - Alk phos 69, AST 61, ALT 56, total bili 1.6 - Secondary to alcohol  abuse - LFTs resolving now - Will need outpatient follow-up with GI for liver surveillance  Thrombocytopenia, chronic - Has been gradually downtrending from 120 to 40 over the last 1 month. - Dropped 50% with initiation of heparin  drip.  Heparin  was discontinued.  Platelets stabilizing now ~53 - Repeat CBC in a.m.  Depression with anxiety - No home meds  Class I obesity BMI 32 - Outpatient follow up for lifestyle modification and risk factor management   DVT prophylaxis: SCDs   Code Status: Full Code Disposition: Heart rate still elevated, on IV meds.  Eventually DC to SNF  Consultants:  Treatment Team:  Consulting Physician: Perla Evalene PARAS, MD  Procedures:    Antimicrobials:  Anti-infectives (From admission, onward)    None       Data Reviewed: I have personally reviewed following labs and imaging studies CBC: Recent Labs  Lab 01/12/24 1854 01/13/24 0455 01/14/24 0242 01/15/24 0400  WBC 10.4 8.9 5.7 6.8  NEUTROABS 8.9*  --   --   --   HGB 15.7 14.1 13.2 14.2  HCT 43.2 39.3 37.8* 40.1  MCV 88.3 89.1 90.9 88.7  PLT 82* 50* 40* 53*   Basic Metabolic Panel: Recent Labs  Lab 01/12/24 1854 01/12/24 1856 01/13/24 0455 01/15/24 0400  NA 136  --  135 131*  K 3.3*  --  4.0 3.1*   CL 88*  --  98 92*  CO2 18*  --  24 23  GLUCOSE 119*  --  112* 85  BUN 11  --  9 7  CREATININE 0.91  --  0.76 0.60*  CALCIUM 8.9  --  8.6* 8.6*  MG  --  2.0  --  2.1  PHOS 3.5  --   --  2.3*   GFR: Estimated Creatinine Clearance: 138.3 mL/min (A) (by C-G formula based on SCr of 0.6 mg/dL (L)). Liver Function Tests: Recent Labs  Lab 01/12/24 1854 01/13/24 0455 01/15/24 0400  AST 61* 48* 60*  ALT 56* 43 57*  ALKPHOS 69 56 65  BILITOT 1.6* 1.1 1.0  PROT 7.8 6.8 6.7  ALBUMIN 4.7 4.1 3.9   CBG: No results for input(s): GLUCAP in the last 168 hours.  No results found for this or any previous visit (from the past 240 hours).   Radiology Studies: ECHOCARDIOGRAM COMPLETE Result Date: 01/14/2024    ECHOCARDIOGRAM REPORT  Patient Name:   Bobby Williams Marsico Date of Exam: 01/14/2024 Medical Rec #:  [id number]      Height:       76.0 in Accession #:    [id number]     Weight:       242.3 lb Date of Birth:  [birth year]      BSA:          2.403 m Patient Age:    58 years       BP:           137/100 mmHg Patient Gender: M              HR:           74 bpm. Exam Location:  [facility] Procedure: 2D Echo, Cardiac Doppler, Color Doppler and Intracardiac            Opacification Agent (Both Spectral and Color Flow Doppler were            utilized during procedure). Indications:     Atrial fibrillation  History:         Patient has no prior history of Echocardiogram examinations.                  Arrythmias:Atrial Fibrillation; Risk Factors:Hypertension.  Sonographer:     Philomena Daring Referring Phys:  [id number] [facility] L CAREY Diagnosing Phys: Evalene Lunger MD  Sonographer Comments: Image acquisition challenging due to respiratory motion. IMPRESSIONS  1. Left ventricular ejection fraction, by estimation, is 55 to 60%. The left ventricle has normal function. The left ventricle has no regional wall motion abnormalities. Left ventricular diastolic parameters are indeterminate.  2. Right ventricular systolic function is  normal. The right ventricular size is normal. Tricuspid regurgitation signal is inadequate for assessing PA pressure.  3. The mitral valve is normal in structure. No evidence of mitral valve regurgitation. No evidence of mitral stenosis.  4. The aortic valve is normal in structure. Aortic valve regurgitation is not visualized. No aortic stenosis is present.  5. The inferior vena cava is normal in size with greater than 50% respiratory variability, suggesting right atrial pressure of 3 mmHg.  6. Rhythm is atrial fibrillation FINDINGS  Left Ventricle: Left ventricular ejection fraction, by estimation, is 55 to 60%. The left ventricle has normal function. The left ventricle has no regional wall motion abnormalities. Definity  contrast agent was given IV to delineate the left ventricular  endocardial borders. Strain was performed and the global longitudinal strain is indeterminate. The left ventricular internal cavity size was normal in size. There is no left ventricular hypertrophy. Left ventricular diastolic parameters are indeterminate. Right Ventricle: The right ventricular size is normal. No increase in right ventricular wall thickness. Right ventricular systolic function is normal. Tricuspid regurgitation signal is inadequate for assessing PA pressure. Left Atrium: Left atrial size was normal in size. Right Atrium: Right atrial size was normal in size. Pericardium: There is no evidence of pericardial effusion. Mitral Valve: The mitral valve is normal in structure. No evidence of mitral valve regurgitation. No evidence of mitral valve stenosis. Tricuspid Valve: The tricuspid valve is normal in structure. Tricuspid valve regurgitation is not demonstrated. No evidence of tricuspid stenosis. Aortic Valve: The aortic valve is normal in structure. Aortic valve regurgitation is not visualized. No aortic stenosis is present. Pulmonic Valve: The pulmonic valve was normal in structure. Pulmonic valve regurgitation is not  visualized. No evidence of pulmonic stenosis. Aorta: The aortic root is normal in size and structure. Venous:  The inferior vena cava is normal in size with greater than 50% respiratory variability, suggesting right atrial pressure of 3 mmHg. IAS/Shunts: No atrial level shunt detected by color flow Doppler. Additional Comments: 3D was performed not requiring image post processing on an independent workstation and was indeterminate.  LEFT VENTRICLE PLAX 2D LVIDd:         3.80 cm   Diastology LVIDs:         2.60 cm   LV e' medial:    11.30 cm/s LV PW:         1.00 cm   LV E/e' medial:  7.9 LV IVS:        1.20 cm   LV e' lateral:   14.40 cm/s LVOT diam:     2.10 cm   LV E/e' lateral: 6.2 LV SV:         46 LV SV Index:   19 LVOT Area:     3.46 cm  RIGHT VENTRICLE             IVC RV S prime:     10.20 cm/s  IVC diam: 2.00 cm TAPSE (M-mode): 2.1 cm LEFT ATRIUM             Index        RIGHT ATRIUM           Index LA diam:        3.20 cm 1.33 cm/m   RA Area:     18.60 cm LA Vol (A2C):   47.0 ml 19.56 ml/m  RA Volume:   45.60 ml  18.97 ml/m LA Vol (A4C):   39.6 ml 16.48 ml/m LA Biplane Vol: 42.9 ml 17.85 ml/m  AORTIC VALVE LVOT Vmax:   95.40 cm/s LVOT Vmean:  63.900 cm/s LVOT VTI:    0.133 m  AORTA Ao Root diam: 3.00 cm MITRAL VALVE MV Area (PHT): 5.02 cm    SHUNTS MV Decel Time: 151 msec    Systemic VTI:  0.13 m MV E velocity: 89.10 cm/s  Systemic Diam: 2.10 cm Evalene Lunger MD Electronically signed by Evalene Lunger MD Signature Date/Time: 01/14/2024/2:24:41 PM    Final     Scheduled Meds:  digoxin   0.25 mg Oral Daily   diltiazem   60 mg Oral Q6H   folic acid   1 mg Oral Daily   LORazepam   2 mg Intravenous Q6H   metoprolol  tartrate  75 mg Oral Q6H   multivitamin with minerals  1 tablet Oral Daily   thiamine   100 mg Oral Daily   Or   thiamine   100 mg Intravenous Daily   Continuous Infusions:     LOS: 4 days  MDM: Patient is high risk for one or more organ failure.  They necessitate ongoing  hospitalization for continued IV therapies and subsequent lab monitoring. Total time spent interpreting labs and vitals, reviewing the medical record, coordinating care amongst consultants and care team members, directly assessing and discussing care with the patient and/or family: 55 min  Decklin Weddington, DO [facility]  To contact the attending physician between 7A-7P please use Epic Chat. To contact the covering physician during after hours 7P-7A, please review Amion.  01/16/2024, 2:11 PM   *This document has been created with the assistance of dictation software. Please excuse typographical errors. *   "

## 2024-01-16 NOTE — Plan of Care (Signed)

## 2024-01-16 NOTE — Progress Notes (Signed)
 "  Rounding Note   Patient Name: Bobby Williams Date of Encounter: 01/16/2024  [facility] Cardiologist: Davene  Subjective Slow improvement of mentation, talking more Found walking in his room into the bathroom, heart rate up to 180 bpm Improved rate laying back in bed Ate breakfast this morning Remains on diltiazem  infusion   Scheduled Meds:  digoxin   0.25 mg Oral Daily   diltiazem   60 mg Oral Q6H   folic acid   1 mg Oral Daily   LORazepam   2 mg Intravenous Q6H   metoprolol  tartrate  75 mg Oral Q6H   multivitamin with minerals  1 tablet Oral Daily   thiamine   100 mg Oral Daily   Or   thiamine   100 mg Intravenous Daily   Continuous Infusions:  PRN Meds: hydrALAZINE , ibuprofen , LORazepam  **OR** LORazepam , ondansetron  (ZOFRAN ) IV   Vital Signs  Vitals:   01/16/24 0503 01/16/24 0754 01/16/24 0950 01/16/24 1237  BP: 111/68 (!) 153/95 120/82 105/77  Pulse: 94 89 (!) 131 (!) 118  Resp:  18  (!) 21  Temp:  97.8 F (36.6 C)  97.9 F (36.6 C)  TempSrc:    Oral  SpO2:  99%  97%  Weight:      Height:        Intake/Output Summary (Last 24 hours) at 01/16/2024 1342 Last data filed at 01/16/2024 0800 Gross per 24 hour  Intake 3612.5 ml  Output 1975 ml  Net 1637.5 ml      01/14/2024   12:48 AM 01/12/2024    7:00 PM 11/30/2023   12:01 AM  Last 3 Weights  Weight (lbs) 242 lb 4.6 oz 257 lb 262 lb 12.6 oz  Weight (kg) 109.9 kg 116.574 kg 119.2 kg      Telemetry Atrial fibrillation with rate 120 up to 180 bpm- Personally Reviewed  ECG   - Personally Reviewed  Physical Exam  GEN: No acute distress.   Neck: No JVD Cardiac: RRR, no murmurs, rubs, or gallops.  Respiratory: Clear to auscultation bilaterally. GI: Soft, nontender, non-distended  MS: No edema; No deformity. Neuro:  Nonfocal  Psych: Normal affect   Labs High Sensitivity Troponin:  No results for input(s): TROPONINIHS in the last 720 hours. No results for input(s): TRNPT in the last 720  hours.     Chemistry Recent Labs  Lab 01/12/24 1854 01/12/24 1856 01/13/24 0455 01/15/24 0400  NA 136  --  135 131*  K 3.3*  --  4.0 3.1*  CL 88*  --  98 92*  CO2 18*  --  24 23  GLUCOSE 119*  --  112* 85  BUN 11  --  9 7  CREATININE 0.91  --  0.76 0.60*  CALCIUM 8.9  --  8.6* 8.6*  MG  --  2.0  --  2.1  PROT 7.8  --  6.8 6.7  ALBUMIN 4.7  --  4.1 3.9  AST 61*  --  48* 60*  ALT 56*  --  43 57*  ALKPHOS 69  --  56 65  BILITOT 1.6*  --  1.1 1.0  GFRNONAA >60  --  >60 >60  ANIONGAP 30*  --  14 16*    Lipids No results for input(s): CHOL, TRIG, HDL, LABVLDL, LDLCALC, CHOLHDL in the last 168 hours.  Hematology Recent Labs  Lab 01/13/24 0455 01/14/24 0242 01/15/24 0400  WBC 8.9 5.7 6.8  RBC 4.41 4.16* 4.52  HGB 14.1 13.2 14.2  HCT 39.3 37.8*  40.1  MCV 89.1 90.9 88.7  MCH 32.0 31.7 31.4  MCHC 35.9 34.9 35.4  RDW 13.0 12.9 12.4  PLT 50* 40* 53*   Thyroid No results for input(s): TSH, FREET4 in the last 168 hours.  BNPNo results for input(s): BNP, PROBNP in the last 168 hours.  DDimer No results for input(s): DDIMER in the last 168 hours.   Radiology  ECHOCARDIOGRAM COMPLETE Result Date: 01/14/2024    ECHOCARDIOGRAM REPORT   Patient Name:   Bobby Williams Date of Exam: 01/14/2024 Medical Rec #:  [id number]      Height:       76.0 in Accession #:    [id number]     Weight:       242.3 lb Date of Birth:  February 22, 1965      BSA:          2.403 m Patient Age:    58 years       BP:           137/100 mmHg Patient Gender: M              HR:           74 bpm. Exam Location:  [facility] Procedure: 2D Echo, Cardiac Doppler, Color Doppler and Intracardiac            Opacification Agent (Both Spectral and Color Flow Doppler were            utilized during procedure). Indications:     Atrial fibrillation  History:         Patient has no prior history of Echocardiogram examinations.                  Arrythmias:Atrial Fibrillation; Risk Factors:Hypertension.  Sonographer:      Philomena Daring Referring Phys:  [id number] [facility] L CAREY Diagnosing Phys: Evalene Lunger MD  Sonographer Comments: Image acquisition challenging due to respiratory motion. IMPRESSIONS  1. Left ventricular ejection fraction, by estimation, is 55 to 60%. The left ventricle has normal function. The left ventricle has no regional wall motion abnormalities. Left ventricular diastolic parameters are indeterminate.  2. Right ventricular systolic function is normal. The right ventricular size is normal. Tricuspid regurgitation signal is inadequate for assessing PA pressure.  3. The mitral valve is normal in structure. No evidence of mitral valve regurgitation. No evidence of mitral stenosis.  4. The aortic valve is normal in structure. Aortic valve regurgitation is not visualized. No aortic stenosis is present.  5. The inferior vena cava is normal in size with greater than 50% respiratory variability, suggesting right atrial pressure of 3 mmHg.  6. Rhythm is atrial fibrillation FINDINGS  Left Ventricle: Left ventricular ejection fraction, by estimation, is 55 to 60%. The left ventricle has normal function. The left ventricle has no regional wall motion abnormalities. Definity  contrast agent was given IV to delineate the left ventricular  endocardial borders. Strain was performed and the global longitudinal strain is indeterminate. The left ventricular internal cavity size was normal in size. There is no left ventricular hypertrophy. Left ventricular diastolic parameters are indeterminate. Right Ventricle: The right ventricular size is normal. No increase in right ventricular wall thickness. Right ventricular systolic function is normal. Tricuspid regurgitation signal is inadequate for assessing PA pressure. Left Atrium: Left atrial size was normal in size. Right Atrium: Right atrial size was normal in size. Pericardium: There is no evidence of pericardial effusion. Mitral Valve: The mitral valve is normal in structure. No  evidence of  mitral valve regurgitation. No evidence of mitral valve stenosis. Tricuspid Valve: The tricuspid valve is normal in structure. Tricuspid valve regurgitation is not demonstrated. No evidence of tricuspid stenosis. Aortic Valve: The aortic valve is normal in structure. Aortic valve regurgitation is not visualized. No aortic stenosis is present. Pulmonic Valve: The pulmonic valve was normal in structure. Pulmonic valve regurgitation is not visualized. No evidence of pulmonic stenosis. Aorta: The aortic root is normal in size and structure. Venous: The inferior vena cava is normal in size with greater than 50% respiratory variability, suggesting right atrial pressure of 3 mmHg. IAS/Shunts: No atrial level shunt detected by color flow Doppler. Additional Comments: 3D was performed not requiring image post processing on an independent workstation and was indeterminate.  LEFT VENTRICLE PLAX 2D LVIDd:         3.80 cm   Diastology LVIDs:         2.60 cm   LV e' medial:    11.30 cm/s LV PW:         1.00 cm   LV E/e' medial:  7.9 LV IVS:        1.20 cm   LV e' lateral:   14.40 cm/s LVOT diam:     2.10 cm   LV E/e' lateral: 6.2 LV SV:         46 LV SV Index:   19 LVOT Area:     3.46 cm  RIGHT VENTRICLE             IVC RV S prime:     10.20 cm/s  IVC diam: 2.00 cm TAPSE (M-mode): 2.1 cm LEFT ATRIUM             Index        RIGHT ATRIUM           Index LA diam:        3.20 cm 1.33 cm/m   RA Area:     18.60 cm LA Vol (A2C):   47.0 ml 19.56 ml/m  RA Volume:   45.60 ml  18.97 ml/m LA Vol (A4C):   39.6 ml 16.48 ml/m LA Biplane Vol: 42.9 ml 17.85 ml/m  AORTIC VALVE LVOT Vmax:   95.40 cm/s LVOT Vmean:  63.900 cm/s LVOT VTI:    0.133 m  AORTA Ao Root diam: 3.00 cm MITRAL VALVE MV Area (PHT): 5.02 cm    SHUNTS MV Decel Time: 151 msec    Systemic VTI:  0.13 m MV E velocity: 89.10 cm/s  Systemic Diam: 2.10 cm Evalene Lunger MD Electronically signed by Evalene Lunger MD Signature Date/Time: 01/14/2024/2:24:41 PM    Final      Cardiac Studies   Patient Profile   Bobby Williams is a 58 y.o. male with a hx of hypertension, thrombocytopenia, depression with anxiety, and alcohol  abuse who is being seen 01/13/2024 for the evaluation of new onset atrial fibrillation with RVR   Assessment & Plan  Atrial fibrillation with RVR In the setting of alcohol  abuse, alcohol  withdrawal -Rapid rate on arrival -Drinks 4-5 bottles of wine per day On CIWA protocol for alcohol  withdrawal -Was initially placed on amiodarone , this was discontinued as he is not on anticoagulation -Heparin  initiated but held secondary to thrombocytopenia -Recommend we discontinue diltiazem  infusion, start oral diltiazem  60 Q6 -So far has been tolerating titration up on metoprolol  tartrate 50 every 6 hours - Currently not a candidate for TEE cardioversion   Alcohol  abuse/withdrawal Long history of alcohol  abuse -Notes indicating he  drinks 4-5 bottles of wine per day Currently on CIWA protocol, sedated -May need placement in alcohol  rehab   Essential hypertension Continue diltiazem  changed from infusion to oral pills -Metoprolol   tartrate up to 50 every 6 hours, further titration upwards as blood pressure tolerates   Thrombocytopenia In the setting of alcohol  abuse, liver dysfunction with elevated LFTs -Heparin  infusion held - High risk of bleeding, unable to exclude esophageal varices and other complications from chronic alcohol  abuse  Delirium In the setting of alcohol  withdrawal Also with gait instability, nurses having to redirect him to stay seated for his protection  For questions or updates, please contact [facility] Please consult [email or web address] for contact info under   Signed, Gerilyn Stargell, MD  01/16/2024, 1:42 PM    "

## 2024-01-17 DIAGNOSIS — S41112A Laceration without foreign body of left upper arm, initial encounter: Secondary | ICD-10-CM | POA: Diagnosis not present

## 2024-01-17 DIAGNOSIS — F10939 Alcohol use, unspecified with withdrawal, unspecified: Secondary | ICD-10-CM | POA: Diagnosis not present

## 2024-01-17 DIAGNOSIS — I10 Essential (primary) hypertension: Secondary | ICD-10-CM | POA: Diagnosis not present

## 2024-01-17 DIAGNOSIS — R000 Tachycardia, unspecified: Secondary | ICD-10-CM | POA: Diagnosis not present

## 2024-01-17 LAB — COMPREHENSIVE METABOLIC PANEL WITH GFR
ALT: 51 U/L — ABNORMAL HIGH (ref 0–44)
AST: 48 U/L — ABNORMAL HIGH (ref 15–41)
Albumin: 3.8 g/dL (ref 3.5–5.0)
Alkaline Phosphatase: 64 U/L (ref 38–126)
Anion gap: 15 (ref 5–15)
BUN: 11 mg/dL (ref 6–20)
CO2: 23 mmol/L (ref 22–32)
Calcium: 8.7 mg/dL — ABNORMAL LOW (ref 8.9–10.3)
Chloride: 97 mmol/L — ABNORMAL LOW (ref 98–111)
Creatinine, Ser: 0.73 mg/dL (ref 0.61–1.24)
GFR, Estimated: >60 mL/min
Glucose, Bld: 97 mg/dL (ref 70–99)
Potassium: 3 mmol/L — ABNORMAL LOW (ref 3.5–5.1)
Sodium: 134 mmol/L — ABNORMAL LOW (ref 135–145)
Total Bilirubin: 0.6 mg/dL (ref 0.0–1.2)
Total Protein: 6.6 g/dL (ref 6.5–8.1)

## 2024-01-17 LAB — CBC
HCT: 42.2 % (ref 39.0–52.0)
Hemoglobin: 14.9 g/dL (ref 13.0–17.0)
MCH: 31.3 pg (ref 26.0–34.0)
MCHC: 35.3 g/dL (ref 30.0–36.0)
MCV: 88.7 fL (ref 80.0–100.0)
Platelets: 105 K/uL — ABNORMAL LOW (ref 150–400)
RBC: 4.76 MIL/uL (ref 4.22–5.81)
RDW: 12.5 % (ref 11.5–15.5)
WBC: 5.5 K/uL (ref 4.0–10.5)
nRBC: 0 % (ref 0.0–0.2)

## 2024-01-17 LAB — PHOSPHORUS: Phosphorus: 4.1 mg/dL (ref 2.5–4.6)

## 2024-01-17 LAB — MAGNESIUM: Magnesium: 2.1 mg/dL (ref 1.7–2.4)

## 2024-01-17 MED ORDER — POTASSIUM CHLORIDE 10 MEQ/100ML IV SOLN
10.0000 meq | INTRAVENOUS | Status: AC
  Administered 2024-01-17 (×3): 10 meq via INTRAVENOUS
  Filled 2024-01-17 (×3): qty 100

## 2024-01-17 MED ORDER — POTASSIUM CHLORIDE CRYS ER 20 MEQ PO TBCR
40.0000 meq | EXTENDED_RELEASE_TABLET | Freq: Two times a day (BID) | ORAL | Status: AC
  Administered 2024-01-17 (×2): 40 meq via ORAL
  Filled 2024-01-17 (×2): qty 2

## 2024-01-17 NOTE — Progress Notes (Addendum)
 "    Progress Note    Bobby Williams  FMW:[id number] DOB: 07/14/1965  DOA: 01/12/2024 PCP: Pcp, No      Brief Narrative:    Medical records reviewed and are as summarized below:  Bobby Williams is a 58 y.o. male with medical history significant for alcohol  use disorder, depression, anxiety, thrombocytopenia, hypertension, who presented to the hospital because of a fall and left forearm laceration that occurred about 2 days prior to admission.  He drinks about 4-5 bottles of wine daily.  When EMS arrived, apparently his heart rate was between 170-200.   Vitals in the ED: Temperature 97.5, respiratory rate 19, pulse 175, BP 121/82, oxygen saturation 92% on room air  He was found to have atrial fibrillation with RVR and alcohol  withdrawal syndrome.    Assessment/Plan:   Principal Problem:   Alcohol  withdrawal (HCC) Active Problems:   Tachycardia   Atrial fibrillation with RVR (HCC)   Fall at home, initial encounter with skin laceration in left forearm   HTN (hypertension)   Metabolic acidosis   Hypokalemia   Abnormal LFTs   Thrombocytopenia   Depression with anxiety   Obesity (BMI 30-39.9)   Arm laceration, left, initial encounter    Body mass index is 29.49 kg/m.    Atrial fibrillation with RVR: Heart rate is uncontrolled.  He still has intermittent episodes of significant tachycardia.  Continue digoxin , diltiazem  and metoprolol . Follow-up with cardiologist for further recommendations. He was previously treated with amiodarone  and IV heparin  drip but these were discontinued.  Heparin  was discontinued because of significant thrombocytopenia and amiodarone  was discontinued because he was no longer on anticoagulation.   Alcohol  withdrawal syndrome, delirium: Improved.  Continue Ativan  per CIWA protocol.   Acute on chronic thrombocytopenia: Platelet count improving. Anticoagulation may be considered if platelet count continue to improve   Hypokalemia: Replete  potassium and monitor levels Hyponatremia: Improving.  Sodium up from 131-134.   Elevated liver enzymes: Probably from alcohol  use disorder and fatty liver.   General weakness, s/p fall: PT recommended discharge to SNF. Left foot wound skin laceration: Continue local wound care   Comorbidities include depression, anxiety, hypertension   Case discussed in person with Dr. Perla, cardiologist.  Diet Order             Diet Heart Fluid consistency: Thin  Diet effective now                                  Consultants: Cardiologist  Procedures: None    Medications:    digoxin   0.25 mg Oral Daily   diltiazem   60 mg Oral Q6H   folic acid   1 mg Oral Daily   LORazepam   1 mg Intravenous Q6H   metoprolol  tartrate  75 mg Oral Q6H   multivitamin with minerals  1 tablet Oral Daily   potassium chloride   40 mEq Oral BID   thiamine   100 mg Oral Daily   Or   thiamine   100 mg Intravenous Daily   Continuous Infusions:   Anti-infectives (From admission, onward)    None              Family Communication/Anticipated D/C date and plan/Code Status   DVT prophylaxis: Place and maintain sequential compression device Start: 01/14/24 1214 SCDs Start: 01/12/24 2043     Code Status: Full Code  Family Communication: None Disposition Plan: Plan to discharge to SNF  Status is: Inpatient Remains inpatient appropriate because: A-fib with RVR       Subjective:   Interval events noted.  Heart rate went up to 180 this morning when he stood up to urinate.  He complains of fatigue and general weakness.  Pra[place]a, RN, at the bedside  Objective:    Vitals:   01/17/24 0508 01/17/24 0700 01/17/24 1126 01/17/24 1325  BP: 125/85 (!) 125/94 95/83 100/75  Pulse: 98 (!) 110 90 83  Resp:  18 18 20   Temp:  98.3 F (36.8 C) 97.7 F (36.5 C) 97.8 F (36.6 C)  TempSrc:      SpO2:  99% 99% 95%  Weight:      Height:       No data  found.   Intake/Output Summary (Last 24 hours) at 01/17/2024 1456 Last data filed at 01/17/2024 0100 Gross per 24 hour  Intake 216.28 ml  Output 600 ml  Net -383.72 ml   Filed Weights   01/12/24 1900 01/14/24 0048  Weight: 116.6 kg 109.9 kg    Exam:  GEN: NAD SKIN: Warm and dry EYES: EOMI ENT: MMM CV: Irregular rate and rhythm, tachycardic PULM: CTA B ABD: soft, ND, NT, +BS CNS: AAO x 3, non focal EXT: No edema or tenderness       Data Reviewed:   I have personally reviewed following labs and imaging studies:  Labs: Labs show the following:   Basic Metabolic Panel: Recent Labs  Lab 01/12/24 1854 01/12/24 1856 01/13/24 0455 01/15/24 0400 01/17/24 0426  NA 136  --  135 131* 134*  K 3.3*  --  4.0 3.1* 3.0*  CL 88*  --  98 92* 97*  CO2 18*  --  24 23 23   GLUCOSE 119*  --  112* 85 97  BUN 11  --  9 7 11   CREATININE 0.91  --  0.76 0.60* 0.73  CALCIUM 8.9  --  8.6* 8.6* 8.7*  MG  --  2.0  --  2.1 2.1  PHOS 3.5  --   --  2.3* 4.1   GFR Estimated Creatinine Clearance: 138.3 mL/min (by C-G formula based on SCr of 0.73 mg/dL). Liver Function Tests: Recent Labs  Lab 01/12/24 1854 01/13/24 0455 01/15/24 0400 01/17/24 0426  AST 61* 48* 60* 48*  ALT 56* 43 57* 51*  ALKPHOS 69 56 65 64  BILITOT 1.6* 1.1 1.0 0.6  PROT 7.8 6.8 6.7 6.6  ALBUMIN 4.7 4.1 3.9 3.8   Recent Labs  Lab 01/12/24 1854  LIPASE 48   No results for input(s): AMMONIA in the last 168 hours. Coagulation profile Recent Labs  Lab 01/13/24 1139  INR 1.0    CBC: Recent Labs  Lab 01/12/24 1854 01/13/24 0455 01/14/24 0242 01/15/24 0400 01/17/24 0426  WBC 10.4 8.9 5.7 6.8 5.5  NEUTROABS 8.9*  --   --   --   --   HGB 15.7 14.1 13.2 14.2 14.9  HCT 43.2 39.3 37.8* 40.1 42.2  MCV 88.3 89.1 90.9 88.7 88.7  PLT 82* 50* 40* 53* 105*   Cardiac Enzymes: No results for input(s): CKTOTAL, CKMB, CKMBINDEX, TROPONINI in the last 168 hours. BNP (last 3 results) No results  for input(s): PROBNP in the last 8760 hours. CBG: No results for input(s): GLUCAP in the last 168 hours. D-Dimer: No results for input(s): DDIMER in the last 72 hours. Hgb A1c: No results for input(s): HGBA1C in the last 72 hours. Lipid Profile: No results for input(s):  CHOL, HDL, LDLCALC, TRIG, CHOLHDL, LDLDIRECT in the last 72 hours. Thyroid function studies: No results for input(s): TSH, T4TOTAL, T3FREE, THYROIDAB in the last 72 hours.  Invalid input(s): FREET3 Anemia work up: No results for input(s): VITAMINB12, FOLATE, FERRITIN, TIBC, IRON, RETICCTPCT in the last 72 hours. Sepsis Labs: Recent Labs  Lab 01/13/24 0455 01/14/24 0242 01/15/24 0400 01/17/24 0426  WBC 8.9 5.7 6.8 5.5    Microbiology No results found for this or any previous visit (from the past 240 hours).  Procedures and diagnostic studies:  No results found.             LOS: 5 days   Melbourne Jakubiak  [facility]   Pager on [email or web address]. If 7PM-7AM, please contact night-coverage at [email or web address]     01/17/2024, 2:56 PM           "

## 2024-01-17 NOTE — Progress Notes (Signed)
 0830:- Patient's HR at 160's to 180's. MD made aware, 10 am scheduled Metoprolol  and Digoxin  given.  08:50 :- HR fluctuating between 100-130, MD aware.  11:30 :- BP 95/83(89), HR  110-140, MD made aware, PO Cardizem  60mg  given.  13:25 :- Repeat BP after cardizem  100/75(83).

## 2024-01-17 NOTE — Plan of Care (Signed)

## 2024-01-17 NOTE — Progress Notes (Signed)
 PT Cancellation Note  Patient Details Name: Bobby Williams MRN: [id number] DOB: 10-Apr-1965   Cancelled Treatment:    Reason Eval/Treat Not Completed: Fatigue/lethargy limiting ability to participate  Declined session.  Reported poor sleep last night.  Well return at a later time/date.   Lauraine Gills 01/17/2024, 1:35 PM

## 2024-01-17 NOTE — Plan of Care (Signed)
  Problem: Clinical Measurements: Goal: Ability to maintain clinical measurements within normal limits will improve Outcome: Progressing   Problem: Activity: Goal: Risk for activity intolerance will decrease Outcome: Progressing   Problem: Pain Managment: Goal: General experience of comfort will improve and/or be controlled Outcome: Progressing   Problem: Safety: Goal: Ability to remain free from injury will improve Outcome: Progressing

## 2024-01-17 NOTE — Progress Notes (Signed)
 "  Rounding Note   Patient Name: Bobby Williams Date of Encounter: 01/17/2024  [facility] Cardiologist: Davene  Subjective Reports he did not sleep well last night, nurses insisted he keep the door open, lots of noise in the hallway kept him awake Continues to have atrial fibrillation with RVR on ambulation, straining on the toilet Overall asymptomatic, eating better, had full meals yesterday Prior to eating in the hospital reports he had not been eating for about a week High alcohol  intake   Scheduled Meds:  digoxin   0.25 mg Oral Daily   diltiazem   60 mg Oral Q6H   folic acid   1 mg Oral Daily   LORazepam   1 mg Intravenous Q6H   metoprolol  tartrate  75 mg Oral Q6H   multivitamin with minerals  1 tablet Oral Daily   potassium chloride   40 mEq Oral BID   thiamine   100 mg Oral Daily   Or   thiamine   100 mg Intravenous Daily   Continuous Infusions:  potassium chloride  10 mEq (01/17/24 1156)   PRN Meds: hydrALAZINE , ibuprofen , loperamide , ondansetron  (ZOFRAN ) IV   Vital Signs  Vitals:   01/17/24 0348 01/17/24 0508 01/17/24 0700 01/17/24 1126  BP: 127/88 125/85 (!) 125/94 95/83  Pulse: 97 98 (!) 110 90  Resp: (!) 25  18 18   Temp: 98 F (36.7 C)  98.3 F (36.8 C) 97.7 F (36.5 C)  TempSrc: Oral     SpO2: 95%  99% 99%  Weight:      Height:        Intake/Output Summary (Last 24 hours) at 01/17/2024 1248 Last data filed at 01/17/2024 0100 Gross per 24 hour  Intake 216.28 ml  Output 600 ml  Net -383.72 ml      01/14/2024   12:48 AM 01/12/2024    7:00 PM 11/30/2023   12:01 AM  Last 3 Weights  Weight (lbs) 242 lb 4.6 oz 257 lb 262 lb 12.6 oz  Weight (kg) 109.9 kg 116.574 kg 119.2 kg      Telemetry Atrial fibrillation with rate 120 up to 180 bpm- Personally Reviewed  ECG   - Personally Reviewed  Physical Exam  On examination : alert oriented, no JVD, lungs clear to auscultation bilaterally, heart sounds irregularly irregular no murmurs appreciated,  abdomen soft nontender no significant lower extremity edema.  Musculoskeletal exam with good range of motion, neurologic exam grossly nonfocal  Labs High Sensitivity Troponin:  No results for input(s): TROPONINIHS in the last 720 hours. No results for input(s): TRNPT in the last 720 hours.     Chemistry Recent Labs  Lab 01/12/24 1856 01/13/24 0455 01/15/24 0400 01/17/24 0426  NA  --  135 131* 134*  K  --  4.0 3.1* 3.0*  CL  --  98 92* 97*  CO2  --  24 23 23   GLUCOSE  --  112* 85 97  BUN  --  9 7 11   CREATININE  --  0.76 0.60* 0.73  CALCIUM  --  8.6* 8.6* 8.7*  MG 2.0  --  2.1 2.1  PROT  --  6.8 6.7 6.6  ALBUMIN  --  4.1 3.9 3.8  AST  --  48* 60* 48*  ALT  --  43 57* 51*  ALKPHOS  --  56 65 64  BILITOT  --  1.1 1.0 0.6  GFRNONAA  --  >60 >60 >60  ANIONGAP  --  14 16* 15    Lipids No results  for input(s): CHOL, TRIG, HDL, LABVLDL, LDLCALC, CHOLHDL in the last 168 hours.  Hematology Recent Labs  Lab 01/14/24 0242 01/15/24 0400 01/17/24 0426  WBC 5.7 6.8 5.5  RBC 4.16* 4.52 4.76  HGB 13.2 14.2 14.9  HCT 37.8* 40.1 42.2  MCV 90.9 88.7 88.7  MCH 31.7 31.4 31.3  MCHC 34.9 35.4 35.3  RDW 12.9 12.4 12.5  PLT 40* 53* 105*   Thyroid No results for input(s): TSH, FREET4 in the last 168 hours.  BNPNo results for input(s): BNP, PROBNP in the last 168 hours.  DDimer No results for input(s): DDIMER in the last 168 hours.   Radiology  No results found.   Cardiac Studies   Patient Profile   Bobby Williams is a 58 y.o. male with a hx of hypertension, thrombocytopenia, depression with anxiety, and alcohol  abuse who is being seen 01/13/2024 for the evaluation of new onset atrial fibrillation with RVR   Assessment & Plan  Atrial fibrillation with RVR In the setting of alcohol  abuse, alcohol  withdrawal -Reports he had not had anything to eat in a week, had high alcohol  intake, 4-5 bottles of wine per day -Rapid rate on arrival On CIWA protocol for  alcohol  withdrawal -Was initially placed on amiodarone , this was discontinued as he is not on anticoagulation -Heparin  initiated but held secondary to thrombocytopenia -Blood pressure stable as we have been titrating up on diltiazem  and metoprolol  tartrate Tolerating diltiazem  60 Q6, metoprolol  tartrate 75 every 6 hours - Currently not a candidate for TEE cardioversion in the setting of withdrawal, not on anticoagulation -If platelets continue to improve could consider retest of heparin  infusion with eventual transition to Eliquis    Alcohol  abuse/withdrawal Long history of alcohol  abuse  drinks 4-5 bottles of wine per day Currently on CIWA protocol, more alert - Would likely benefit from alcohol  rehab placement   Essential hypertension Continue diltiazem  60 every 6 hours -Metoprolol   tartrate 75 every 6 hours,  - Blood pressure low, will avoid further titration   Thrombocytopenia In the setting of alcohol  abuse, liver dysfunction with elevated LFTs -Heparin  infusion held - High risk of bleeding, unable to exclude esophageal varices and other complications from chronic alcohol  abuse - Could retest with heparin  infusion if platelets continue to improve, likely going to be a poor candidate for long-term anticoagulation if he continues to drink  Delirium In the setting of alcohol  withdrawal -Slow improvement, gait and mentation improving   For questions or updates, please contact [facility] Please consult [email or web address] for contact info under   Signed, Lesleigh Hughson, MD  01/17/2024, 12:48 PM    "

## 2024-01-17 NOTE — TOC Initial Note (Signed)
 Transition of Care [facility]) - Initial/Assessment Note    Patient Details  Name: Bobby Williams MRN: [id number] Date of Birth: [birth year]  Transition of Care [facility]) CM/SW Contact:    Victory Jackquline RAMAN, RN Phone Number: 01/17/2024, 5:08 PM  Clinical Narrative:   RNCM Spoke to patient, introduced myself and my role and explained that discharge planning would be discussed. PT is recommending STR. Patient is not in agreement with STR at this time. He feels like as he gets better he will be able to ambulate with no problem. He said that prior to this admission he was independent. He lives alone and doesn't have any DME at home. He feels like he's unsteady right now because he's bee lying in bed for 5 days and if he tries to get out of bed the bed alarm goes off. I advised him to participate with PT so that he can get stronger and be able to walk. RNCM will continue to follow for discharge planning needs           Expected Discharge Plan: Skilled Nursing Facility Barriers to Discharge: Continued Medical Work up   Patient Goals and CMS Choice            Expected Discharge Plan and Services       Living arrangements for the past 2 months: Single Family Home                                      Prior Living Arrangements/Services Living arrangements for the past 2 months: Single Family Home Lives with:: Self Patient language and need for interpreter reviewed:: Yes Do you feel safe going back to the place where you live?: Yes      Need for Family Participation in Patient Care: Yes (Comment) Care giver support system in place?: Yes (comment)   Criminal Activity/Legal Involvement Pertinent to Current Situation/Hospitalization: No - Comment as needed  Activities of Daily Living      Permission Sought/Granted                  Emotional Assessment Appearance:: Appears stated age Attitude/Demeanor/Rapport: Gracious Affect (typically observed): Calm, Accepting, Quiet,  Pleasant Orientation: : Oriented to Self, Oriented to Place, Oriented to  Time, Oriented to Situation Alcohol  / Substance Use: Alcohol  Use Psych Involvement: No (comment)  Admission diagnosis:  Alcohol  withdrawal (HCC) [F10.939] Tachycardia [R00.0] Arm laceration, left, initial encounter [S41.112A] Alcohol  withdrawal syndrome with complication (HCC) [F10.939] Patient Active Problem List   Diagnosis Date Noted   Atrial fibrillation with RVR (HCC) 01/13/2024   Arm laceration, left, initial encounter 01/13/2024   Fall at home, initial encounter with skin laceration in left forearm 01/12/2024   Hypokalemia 01/12/2024   Abnormal LFTs 01/12/2024   HTN (hypertension) 11/30/2023   Alcohol  withdrawal (HCC) 11/29/2023   Depression with anxiety 11/29/2023   Obesity (BMI 30-39.9) 11/29/2023   Thrombocytopenia 11/29/2023   Metabolic acidosis 11/29/2023   Abnormal liver function 11/29/2023   Tachycardia 11/29/2023   Nicotine  dependence 08/22/2020   Need for hepatitis C screening test 08/22/2020   Prostate cancer screening 08/22/2020   Need for Tdap vaccination 08/22/2020   Class 1 obesity due to excess calories with serious comorbidity and body mass index (BMI) of 34.0 to 34.9 in adult 08/22/2020   Anxiety 12/03/2018   Sepsis (HCC) 11/23/2018   Uncontrolled hypertension 11/23/2018   Alcohol  withdrawal syndrome without complication (HCC) 01/22/2016  Rib fracture 01/22/2016   CAP (community acquired pneumonia) 01/22/2016   PCP:  Pcp, No Pharmacy:   [id number] [place], [place] - [address] [place] [address] Phone: [phone] Fax: [phone]  [id number] [place], [place] - [address] AT [facility] OF [address] [place] [address] Phone: [phone] Fax: [phone]     Social Drivers of Health (SDOH) Social History: SDOH Screenings   Food Insecurity: Unknown (02/24/2023)   Received from  [facility]  Utilities: Unknown (02/24/2023)   Received from [facility]  Depression (PHQ2-9): Low Risk (07/08/2021)  Tobacco Use: Medium Risk (01/12/2024)   SDOH Interventions:     Readmission Risk Interventions     No data to display

## 2024-01-18 DIAGNOSIS — F10939 Alcohol use, unspecified with withdrawal, unspecified: Secondary | ICD-10-CM | POA: Diagnosis not present

## 2024-01-18 DIAGNOSIS — I4891 Unspecified atrial fibrillation: Secondary | ICD-10-CM | POA: Diagnosis not present

## 2024-01-18 LAB — BASIC METABOLIC PANEL WITH GFR
Anion gap: 9 (ref 5–15)
BUN: 9 mg/dL (ref 6–20)
CO2: 26 mmol/L (ref 22–32)
Calcium: 8.5 mg/dL — ABNORMAL LOW (ref 8.9–10.3)
Chloride: 99 mmol/L (ref 98–111)
Creatinine, Ser: 0.8 mg/dL (ref 0.61–1.24)
GFR, Estimated: >60 mL/min
Glucose, Bld: 104 mg/dL — ABNORMAL HIGH (ref 70–99)
Potassium: 3.7 mmol/L (ref 3.5–5.1)
Sodium: 134 mmol/L — ABNORMAL LOW (ref 135–145)

## 2024-01-18 LAB — CBC
HCT: 40.9 % (ref 39.0–52.0)
Hemoglobin: 14.4 g/dL (ref 13.0–17.0)
MCH: 31.6 pg (ref 26.0–34.0)
MCHC: 35.2 g/dL (ref 30.0–36.0)
MCV: 89.7 fL (ref 80.0–100.0)
Platelets: 150 K/uL (ref 150–400)
RBC: 4.56 MIL/uL (ref 4.22–5.81)
RDW: 12.9 % (ref 11.5–15.5)
WBC: 4.7 K/uL (ref 4.0–10.5)
nRBC: 0 % (ref 0.0–0.2)

## 2024-01-18 LAB — GLUCOSE, CAPILLARY: Glucose-Capillary: 98 mg/dL (ref 70–99)

## 2024-01-18 LAB — MAGNESIUM: Magnesium: 2.2 mg/dL (ref 1.7–2.4)

## 2024-01-18 LAB — DIGOXIN LEVEL: Digoxin Level: 0.8 ng/mL (ref 0.8–2.0)

## 2024-01-18 MED ORDER — APIXABAN 5 MG PO TABS
5.0000 mg | ORAL_TABLET | Freq: Two times a day (BID) | ORAL | Status: DC
  Administered 2024-01-18 – 2024-01-20 (×5): 5 mg via ORAL
  Filled 2024-01-18 (×5): qty 1

## 2024-01-18 MED ORDER — CALAMINE EX LOTN
1.0000 | TOPICAL_LOTION | Freq: Two times a day (BID) | CUTANEOUS | Status: DC
  Administered 2024-01-18 – 2024-01-20 (×5): 1 via TOPICAL
  Filled 2024-01-18 (×2): qty 177

## 2024-01-18 MED ORDER — POTASSIUM CHLORIDE CRYS ER 20 MEQ PO TBCR
40.0000 meq | EXTENDED_RELEASE_TABLET | Freq: Once | ORAL | Status: AC
  Administered 2024-01-18: 40 meq via ORAL
  Filled 2024-01-18: qty 2

## 2024-01-18 NOTE — TOC Progression Note (Signed)
 Transition of Care [facility]) - Progression Note    Patient Details  Name: Bobby Williams MRN: [id number] Date of Birth: May 08, 1965  [facility]) CM/SW Contact  Shasta DELENA Daring, RN Phone Number: 01/18/2024, 12:25 PM  Clinical Narrative:    RNCM confirmed with patent that he does not want to go to SNF. He said he is open to it if it's necessary, but doesn't not believe it will be necessary. Is amenable to [facility] services if recommended by the team at discharge.   Does not have a PCP.  Adding resources to AVS.    Expected Discharge Plan: Skilled Nursing Facility Barriers to Discharge: Continued Medical Work up               Expected Discharge Plan and Services       Living arrangements for the past 2 months: Single Family Home                                       Social Drivers of Health (SDOH) Interventions SDOH Screenings   Food Insecurity: Unknown (02/24/2023)   Received from [facility]  Utilities: Unknown (02/24/2023)   Received from [facility]  Depression (PHQ2-9): Low Risk (07/08/2021)  Tobacco Use: Medium Risk (01/12/2024)    Readmission Risk Interventions     No data to display

## 2024-01-18 NOTE — Progress Notes (Signed)
 "  Rounding Note   Patient Name: Bobby Williams Date of Encounter: 01/18/2024  [facility] Cardiologist: [facility]  Subjective Remains in Afib with ventricular rates largely in the low 100s bpm with brief paroxysms into the 140s to 160s bpm.  No chest pain, dyspnea, palpitations, dizziness, presyncope, or syncope.   Scheduled Meds:  digoxin   0.25 mg Oral Daily   diltiazem   60 mg Oral Q6H   folic acid   1 mg Oral Daily   LORazepam   1 mg Intravenous Q6H   metoprolol  tartrate  75 mg Oral Q6H   multivitamin with minerals  1 tablet Oral Daily   thiamine   100 mg Oral Daily   Or   thiamine   100 mg Intravenous Daily   Continuous Infusions:   PRN Meds: hydrALAZINE , ibuprofen , loperamide , ondansetron  (ZOFRAN ) IV   Vital Signs  Vitals:   01/17/24 1949 01/17/24 2335 01/18/24 0435 01/18/24 0535  BP: 99/79 93/68 132/80   Pulse: (!) 105 98 (!) 111 90  Resp: 16 20 18    Temp: 97.8 F (36.6 C) 97.6 F (36.4 C) 98.2 F (36.8 C)   TempSrc:   Oral   SpO2: 99% 97% 97%   Weight:      Height:        Intake/Output Summary (Last 24 hours) at 01/18/2024 0802 Last data filed at 01/18/2024 0400 Gross per 24 hour  Intake --  Output 1850 ml  Net -1850 ml      01/14/2024   12:48 AM 01/12/2024    7:00 PM 11/30/2023   12:01 AM  Last 3 Weights  Weight (lbs) 242 lb 4.6 oz 257 lb 262 lb 12.6 oz  Weight (kg) 109.9 kg 116.574 kg 119.2 kg      Telemetry Afib with ventricular rates in the low 100s largely with brief paroxysms into the 140s to 160s bpm - Personally Reviewed  ECG  No new tracings - Personally Reviewed  Physical Exam  GEN: No acute distress.   Neck: No JVD. Cardiac: IRIR, no murmurs, rubs, or gallops.  Respiratory: Clear to auscultation bilaterally.  GI: Soft, nontender, non-distended.   MS: No edema; No deformity. Neuro:  Alert and oriented x 3; Nonfocal.  Psych: Normal affect.  Labs High Sensitivity Troponin:  No results for input(s): TROPONINIHS in the last  720 hours. No results for input(s): TRNPT in the last 720 hours.     Chemistry Recent Labs  Lab 01/12/24 1856 01/13/24 0455 01/15/24 0400 01/17/24 0426  NA  --  135 131* 134*  K  --  4.0 3.1* 3.0*  CL  --  98 92* 97*  CO2  --  24 23 23   GLUCOSE  --  112* 85 97  BUN  --  9 7 11   CREATININE  --  0.76 0.60* 0.73  CALCIUM  --  8.6* 8.6* 8.7*  MG 2.0  --  2.1 2.1  PROT  --  6.8 6.7 6.6  ALBUMIN  --  4.1 3.9 3.8  AST  --  48* 60* 48*  ALT  --  43 57* 51*  ALKPHOS  --  56 65 64  BILITOT  --  1.1 1.0 0.6  GFRNONAA  --  >60 >60 >60  ANIONGAP  --  14 16* 15    Lipids No results for input(s): CHOL, TRIG, HDL, LABVLDL, LDLCALC, CHOLHDL in the last 168 hours.  Hematology Recent Labs  Lab 01/14/24 0242 01/15/24 0400 01/17/24 0426  WBC 5.7 6.8 5.5  RBC  4.16* 4.52 4.76  HGB 13.2 14.2 14.9  HCT 37.8* 40.1 42.2  MCV 90.9 88.7 88.7  MCH 31.7 31.4 31.3  MCHC 34.9 35.4 35.3  RDW 12.9 12.4 12.5  PLT 40* 53* 105*   Thyroid No results for input(s): TSH, FREET4 in the last 168 hours.  BNPNo results for input(s): BNP, PROBNP in the last 168 hours.  DDimer No results for input(s): DDIMER in the last 168 hours.   Radiology  No results found.   Cardiac Studies 2D echo 01/14/2024: 1. Left ventricular ejection fraction, by estimation, is 55 to 60%. The  left ventricle has normal function. The left ventricle has no regional  wall motion abnormalities. Left ventricular diastolic parameters are  indeterminate.   2. Right ventricular systolic function is normal. The right ventricular  size is normal. Tricuspid regurgitation signal is inadequate for assessing  PA pressure.   3. The mitral valve is normal in structure. No evidence of mitral valve  regurgitation. No evidence of mitral stenosis.   4. The aortic valve is normal in structure. Aortic valve regurgitation is  not visualized. No aortic stenosis is present.   5. The inferior vena cava is normal in size with  greater than 50%  respiratory variability, suggesting right atrial pressure of 3 mmHg.   6. Rhythm is atrial fibrillation   Patient Profile   Bobby Williams is a 58 y.o. male with a hx of hypertension, thrombocytopenia, depression with anxiety, and alcohol  abuse who is being seen for the evaluation of new onset atrial fibrillation with RVR.  Assessment & Plan  Newly diagnosed atrial fibrillation with RVR -In the setting of alcohol  abuse, alcohol  withdrawal -Chronicity unclear, was tachycardic upon EMS arrival after fall -Was initially placed on amiodarone , this was discontinued as he is not on anticoagulation -Heparin  initiated but held secondary to thrombocytopenia -Remains on diltiazem  60 mg every 6 hours, metoprolol  tartrate 75 mg every 6 hours, and digoxin  0.25 mg daily -Consolidate pharmacotherapy as able -Relative hypotension precludes further escalation  -Not currently a candidate for TEE/DCCV in the setting of withdrawal, not on anticoagulation, though if he continues to have difficult to control ventricular rates, may need to pursue this prior to discharge if platelet count remains stable following potential rechallenge of heparin  gtt -If platelets continue to improve could consider retest of heparin  infusion with eventual transition to Eliquis  -Recent TSH mildly elevated at 6.605 with a normal free T4 at that time -CHADS2VASc at least 1 (HTN) -Replete potassium to goal 4.0, trend BMP (receive IV repletion over the past 24 hours) -Magnesium at goal -With underlying thrombocytopenia, recommend EP evaluation as an outpatient for consideration of LAA occluder device, though this would require short term OAC   Alcohol  abuse/withdrawal -Long history of alcohol  abuse -Drinks 4-5 bottles of wine per day -Currently on CIWA protocol, more alert -Would likely benefit from alcohol  rehab placement   Essential hypertension -Continue diltiazem  60 every 6 hours -Metoprolol  tartrate 75 every  6 hours,  -Blood pressure soft, though stable   Thrombocytopenia -In the setting of alcohol  abuse, liver dysfunction with elevated LFTs -Heparin  infusion held -High risk of bleeding, unable to exclude esophageal varices and other complications from chronic alcohol  abuse -Could retest with heparin  infusion if platelets continue to improve, likely going to be a poor candidate for long-term anticoagulation if he continues to drink  Delirium -In the setting of alcohol  withdrawal -Slow improvement, gait and mentation improving    For questions or updates, please contact [facility] Please consult [email or web address] for contact info under   Signed, Bernardino Bring, PA-C  01/18/2024, 8:02 AM    "

## 2024-01-18 NOTE — Progress Notes (Addendum)
 "    Progress Note    Bobby Williams  FMW:[id number] DOB: November 16, 1965  DOA: 01/12/2024 PCP: Pcp, No      Brief Narrative:    Medical records reviewed and are as summarized below:  Bobby Williams is a 58 y.o. male with medical history significant for alcohol  use disorder, depression, anxiety, thrombocytopenia, hypertension, who presented to the hospital because of a fall and left forearm laceration that occurred about 2 days prior to admission.  He drinks about 4-5 bottles of wine daily.  When EMS arrived, apparently his heart rate was between 170-200.   Vitals in the ED: Temperature 97.5, respiratory rate 19, pulse 175, BP 121/82, oxygen saturation 92% on room air  He was found to have atrial fibrillation with RVR and alcohol  withdrawal syndrome.    Assessment/Plan:   Principal Problem:   Alcohol  withdrawal (HCC) Active Problems:   Tachycardia   Atrial fibrillation with RVR (HCC)   Fall at home, initial encounter with skin laceration in left forearm   HTN (hypertension)   Metabolic acidosis   Hypokalemia   Abnormal LFTs   Thrombocytopenia   Depression with anxiety   Obesity (BMI 30-39.9)   Arm laceration, left, initial encounter    Body mass index is 29.49 kg/m.    Atrial fibrillation with RVR: Heart rate is better but he is still tachycardic.   Continue digoxin , diltiazem  and metoprolol . Has been started on Eliquis  for stroke prophylaxis since thrombocytopenia has improved. He was previously treated with amiodarone  and IV heparin  drip but these were discontinued.  Heparin  was discontinued because of significant thrombocytopenia and amiodarone  was discontinued because he was no longer on anticoagulation.   Alcohol  withdrawal syndrome, delirium: Improved.  Continue Ativan  per CIWA protocol.   Acute on chronic thrombocytopenia: Platelet count normalized at 150,000.  It went as low as 40,000.     Hypokalemia: Improved.  Continue potassium  repletion. Hyponatremia: Improving.  Sodium up from 131-134.   Elevated liver enzymes: Probably from alcohol  use disorder and fatty liver.   General weakness, s/p fall: He did better with PT and recommendations have been changed from discharge to SNF to discharge with home health. Left foot wound skin laceration: Continue local wound care   Erythematous maculopapular itchy rash on the back, probable miliaria: Apply calamine lotion to rash.   Comorbidities include depression, anxiety, hypertension     Diet Order             Diet Heart Fluid consistency: Thin  Diet effective now                                  Consultants: Cardiologist  Procedures: None    Medications:    apixaban   5 mg Oral BID   calamine  1 Application Topical BID   digoxin   0.25 mg Oral Daily   diltiazem   60 mg Oral Q6H   folic acid   1 mg Oral Daily   LORazepam   1 mg Intravenous Q6H   metoprolol  tartrate  75 mg Oral Q6H   multivitamin with minerals  1 tablet Oral Daily   thiamine   100 mg Oral Daily   Or   thiamine   100 mg Intravenous Daily   Continuous Infusions:   Anti-infectives (From admission, onward)    None              Family Communication/Anticipated D/C date and plan/Code Status   DVT  prophylaxis: Place and maintain sequential compression device Start: 01/14/24 1214 SCDs Start: 01/12/24 2043 apixaban  (ELIQUIS ) tablet 5 mg     Code Status: Full Code  Family Communication: None Disposition Plan: Plan to discharge to SNF   Status is: Inpatient Remains inpatient appropriate because: A-fib with RVR       Subjective:   Interval events noted.  He complains of itchy rash on the back.  He feels better. Charlene, son, at the bedside  Objective:    Vitals:   01/18/24 0812 01/18/24 1115 01/18/24 1323 01/18/24 1332  BP: 121/87 106/78 106/78 118/85  Pulse: 90 92 100 100  Resp: 18  18   Temp: 98.1 F (36.7 C)  98.6 F (37 C) 98.3 F (36.8  C)  TempSrc:   Oral Oral  SpO2: 98%  99% 100%  Weight:      Height:       No data found.   Intake/Output Summary (Last 24 hours) at 01/18/2024 1413 Last data filed at 01/18/2024 1020 Gross per 24 hour  Intake 120 ml  Output 1850 ml  Net -1730 ml   Filed Weights   01/12/24 1900 01/14/24 0048  Weight: 116.6 kg 109.9 kg    Exam:  GEN: NAD SKIN: Warm and dry.  Erythematous maculopapular rash on the back EYES: No pallor or icterus ENT: MMM CV: Irregular rate and rhythm, tachycardic PULM: CTA B ABD: soft, ND, NT, +BS CNS: AAO x 3, non focal EXT: No edema or tenderness        Data Reviewed:   I have personally reviewed following labs and imaging studies:  Labs: Labs show the following:   Basic Metabolic Panel: Recent Labs  Lab 01/12/24 1854 01/12/24 1856 01/13/24 0455 01/15/24 0400 01/17/24 0426 01/18/24 0430  NA 136  --  135 131* 134* 134*  K 3.3*  --  4.0 3.1* 3.0* 3.7  CL 88*  --  98 92* 97* 99  CO2 18*  --  24 23 23 26   GLUCOSE 119*  --  112* 85 97 104*  BUN 11  --  9 7 11 9   CREATININE 0.91  --  0.76 0.60* 0.73 0.80  CALCIUM 8.9  --  8.6* 8.6* 8.7* 8.5*  MG  --  2.0  --  2.1 2.1 2.2  PHOS 3.5  --   --  2.3* 4.1  --    GFR Estimated Creatinine Clearance: 138.3 mL/min (by C-G formula based on SCr of 0.8 mg/dL). Liver Function Tests: Recent Labs  Lab 01/12/24 1854 01/13/24 0455 01/15/24 0400 01/17/24 0426  AST 61* 48* 60* 48*  ALT 56* 43 57* 51*  ALKPHOS 69 56 65 64  BILITOT 1.6* 1.1 1.0 0.6  PROT 7.8 6.8 6.7 6.6  ALBUMIN 4.7 4.1 3.9 3.8   Recent Labs  Lab 01/12/24 1854  LIPASE 48   No results for input(s): AMMONIA in the last 168 hours. Coagulation profile Recent Labs  Lab 01/13/24 1139  INR 1.0    CBC: Recent Labs  Lab 01/12/24 1854 01/13/24 0455 01/14/24 0242 01/15/24 0400 01/17/24 0426 01/18/24 0430  WBC 10.4 8.9 5.7 6.8 5.5 4.7  NEUTROABS 8.9*  --   --   --   --   --   HGB 15.7 14.1 13.2 14.2 14.9 14.4  HCT  43.2 39.3 37.8* 40.1 42.2 40.9  MCV 88.3 89.1 90.9 88.7 88.7 89.7  PLT 82* 50* 40* 53* 105* 150   Cardiac Enzymes: No results for input(s): CKTOTAL,  CKMB, CKMBINDEX, TROPONINI in the last 168 hours. BNP (last 3 results) No results for input(s): PROBNP in the last 8760 hours. CBG: No results for input(s): GLUCAP in the last 168 hours. D-Dimer: No results for input(s): DDIMER in the last 72 hours. Hgb A1c: No results for input(s): HGBA1C in the last 72 hours. Lipid Profile: No results for input(s): CHOL, HDL, LDLCALC, TRIG, CHOLHDL, LDLDIRECT in the last 72 hours. Thyroid function studies: No results for input(s): TSH, T4TOTAL, T3FREE, THYROIDAB in the last 72 hours.  Invalid input(s): FREET3 Anemia work up: No results for input(s): VITAMINB12, FOLATE, FERRITIN, TIBC, IRON, RETICCTPCT in the last 72 hours. Sepsis Labs: Recent Labs  Lab 01/14/24 0242 01/15/24 0400 01/17/24 0426 01/18/24 0430  WBC 5.7 6.8 5.5 4.7    Microbiology No results found for this or any previous visit (from the past 240 hours).  Procedures and diagnostic studies:  No results found.             LOS: 6 days   Sarae Nicholes  [facility]   Pager on [email or web address]. If 7PM-7AM, please contact night-coverage at [email or web address]     01/18/2024, 2:13 PM           "

## 2024-01-18 NOTE — Plan of Care (Signed)

## 2024-01-18 NOTE — Progress Notes (Signed)
 Physical Therapy Treatment Patient Details Name: Bobby Williams MRN: [id number] DOB: 1965-04-01 Today's Date: 01/18/2024   History of Present Illness Pt is a 58 y.o. male with medical history significant of  alcohol  abuse, hypertension, depression with anxiety, thrombocytopenia, presents with alcohol  withdrawal and fall, left forearm skin laceration. MD assessment includes: alcohol  withdrawal, A-fib, fall at home, skin laceration on left forearm.    PT Comments  Pt was pleasant and motivated to participate during the session and put forth good effort throughout. Pt found sitting in recliner with HR in the low 100s and with SpO2 WNL on room air.  Pt able to perform sit to/from stands from multiple height surfaces using single UE to assist from armrest with recliner or grab bar in bathroom.  Pt ambulated 150 feet with steady cadence and with only min lean on the RW for support with cues given for general sequencing but with pt presenting with no overt LOB.  Upon returning to room HR found to be in the 150's with pt in no apparent distress.  HR returned to near baseline after sitting around 30-45 sec.  Pt will benefit from continued PT services upon discharge to safely address deficits listed in patient problem list for decreased caregiver assistance and eventual return to PLOF.      If plan is discharge home, recommend the following: Assistance with cooking/housework;Assist for transportation;A little help with walking and/or transfers;Help with stairs or ramp for entrance   Can travel by private vehicle        Equipment Recommendations  Rolling walker (2 wheels)    Recommendations for Other Services       Precautions / Restrictions Precautions Precautions: Fall Restrictions Weight Bearing Restrictions Per Provider Order: No     Mobility  Bed Mobility               General bed mobility comments: NT, pt in recliner pre-session    Transfers Overall transfer level: Needs  assistance Equipment used: Rolling walker (2 wheels) Transfers: Sit to/from Stand Sit to Stand: Supervision           General transfer comment: Good eccentric and concentric control and stability from multple height surfaces with min UE assist required    Ambulation/Gait Ambulation/Gait assistance: Contact guard assist Gait Distance (Feet): 150 Feet x 1, 20 Feet x 1 Assistive device: Rolling walker (2 wheels) Gait Pattern/deviations: Step-through pattern, Decreased stride length, Trunk flexed Gait velocity: WFL     General Gait Details: Min verbal cues for amb closer to the RW with upright posture but steady with no overt LOB, much improved compared to prior PT session   Stairs             Wheelchair Mobility     Tilt Bed    Modified Rankin (Stroke Patients Only)       Balance Overall balance assessment: Needs assistance Sitting-balance support: Feet supported Sitting balance-Leahy Scale: Good     Standing balance support: Bilateral upper extremity supported, During functional activity Standing balance-Leahy Scale: Good                              Communication Communication Communication: No apparent difficulties  Cognition Arousal: Alert Behavior During Therapy: Impulsive (mildly impulsive but much improved from prior session)   PT - Cognitive impairments: No apparent impairments  Following commands: Impaired Following commands impaired: Follows one step commands with increased time    Cueing Cueing Techniques: Verbal cues, Visual cues, Tactile cues  Exercises      General Comments        Pertinent Vitals/Pain Pain Assessment Pain Assessment: No/denies pain    Home Living                          Prior Function            PT Goals (current goals can now be found in the care plan section) Progress towards PT goals: Progressing toward goals    Frequency    Min  2X/week      PT Plan      Co-evaluation              AM-PAC PT 6 Clicks Mobility   Outcome Measure  Help needed turning from your back to your side while in a flat bed without using bedrails?: A Little Help needed moving from lying on your back to sitting on the side of a flat bed without using bedrails?: A Little Help needed moving to and from a bed to a chair (including a wheelchair)?: A Little Help needed standing up from a chair using your arms (e.g., wheelchair or bedside chair)?: A Little Help needed to walk in hospital room?: A Little Help needed climbing 3-5 steps with a railing? : A Little 6 Click Score: 18    End of Session Equipment Utilized During Treatment: Gait belt Activity Tolerance: Patient tolerated treatment well Patient left: Other (comment) (Pt left in bathroom for BM) Nurse Communication: Mobility status;Other (comment) (Nursing notified that pt in BR for BM with instructions to pull cord when done) PT Visit Diagnosis: Unsteadiness on feet (R26.81);Difficulty in walking, not elsewhere classified (R26.2);History of falling (Z91.81)     Time: 8864-8850 PT Time Calculation (min) (ACUTE ONLY): 14 min  Charges:    $Gait Training: 8-22 mins PT General Charges $$ ACUTE PT VISIT: 1 Visit                     D. Scott Desia Saban PT, DPT 01/18/2024, 2:13 PM

## 2024-01-18 NOTE — Progress Notes (Signed)
 Occupational Therapy Treatment Patient Details Name: Bobby Williams MRN: [id number] DOB: 01/23/1966 Today's Date: 01/18/2024   History of present illness Pt is a 58 y.o. male with medical history significant of  alcohol  abuse, hypertension, depression with anxiety, thrombocytopenia, presents with alcohol  withdrawal and fall, left forearm skin laceration. MD assessment includes: alcohol  withdrawal, A-fib, fall at home, skin laceration on left forearm.   OT comments  Bobby Williams was seen for OT treatment on this date. Upon arrival to room pt in bed, agreeable to tx. Pt requires CGA for functional mobility ~200 ft, stagger steps noted and difficulty navigating turns, no direct assist to correct. MIN A to correct posterior LOB reaching above head height. SBA don underwear and shoes. Educated on falls safety. Pt making good progress toward goals, will continue to follow POC. Discharge recommendation updated to reflect progress.       If plan is discharge home, recommend the following:  A little help with bathing/dressing/bathroom;A lot of help with walking and/or transfers;A lot of help with bathing/dressing/bathroom;A little help with walking and/or transfers;Assistance with cooking/housework;Help with stairs or ramp for entrance   Equipment Recommendations  Tub/shower seat    Recommendations for Other Services      Precautions / Restrictions Precautions Precautions: Fall Recall of Precautions/Restrictions: Impaired Restrictions Weight Bearing Restrictions Per Provider Order: No       Mobility Bed Mobility Overal bed mobility: Modified Independent                  Transfers Overall transfer level: Needs assistance Equipment used: None Transfers: Sit to/from Stand Sit to Stand: Supervision                 Balance Overall balance assessment: Needs assistance Sitting-balance support: Feet supported Sitting balance-Leahy Scale: Good     Standing balance support: No  upper extremity supported Standing balance-Leahy Scale: Fair                             ADL either performed or assessed with clinical judgement   ADL Overall ADL's : Needs assistance/impaired                                       General ADL Comments: CGA for functional mobility ~200 ft, stagger steps noted and difficulty navigating turns. MIN A to correct posterior LOB reaching above head height. SBA don underwear and shoes     Communication Communication Communication: No apparent difficulties   Cognition Arousal: Alert Behavior During Therapy: Impulsive Cognition: No family/caregiver present to determine baseline                               Following commands: Impaired Following commands impaired: Follows multi-step commands with increased time                    Pertinent Vitals/ Pain       Pain Assessment Pain Assessment: No/denies pain   Frequency  Min 2X/week        Progress Toward Goals  OT Goals(current goals can now be found in the care plan section)  Progress towards OT goals: Progressing toward goals  Acute Rehab OT Goals OT Goal Formulation: With patient Time For Goal Achievement: 01/29/24 Potential to Achieve Goals: Good ADL Goals Pt Will Perform  Grooming: with supervision;standing Pt Will Perform Lower Body Dressing: with supervision;with modified independence;sitting/lateral leans;sit to/from stand Pt Will Transfer to Toilet: with supervision;ambulating  Plan      Co-evaluation                 AM-PAC OT 6 Clicks Daily Activity     Outcome Measure   Help from another person eating meals?: None Help from another person taking care of personal grooming?: None Help from another person toileting, which includes using toliet, bedpan, or urinal?: A Little Help from another person bathing (including washing, rinsing, drying)?: A Little Help from another person to put on and taking off  regular upper body clothing?: None Help from another person to put on and taking off regular lower body clothing?: A Little 6 Click Score: 21    End of Session Equipment Utilized During Treatment: Gait belt  OT Visit Diagnosis: Other abnormalities of gait and mobility (R26.89);Unsteadiness on feet (R26.81);Muscle weakness (generalized) (M62.81)   Activity Tolerance Patient tolerated treatment well   Patient Left in chair;with call bell/phone within reach;with chair alarm set   Nurse Communication Mobility status        Time: 8891-8868 OT Time Calculation (min): 23 min  Charges: OT General Charges $OT Visit: 1 Visit OT Treatments $Self Care/Home Management : 8-22 mins $Therapeutic Activity: 8-22 mins  Bobby Williams, M.S. OTR/L  01/18/2024, 12:19 PM  ascom [phone]

## 2024-01-19 ENCOUNTER — Encounter
Admission: EM | Discharge status: Against Medical Advice | Payer: Self-pay | Source: Home / Self Care | Attending: Family Medicine

## 2024-01-19 DIAGNOSIS — I4891 Unspecified atrial fibrillation: Secondary | ICD-10-CM | POA: Diagnosis not present

## 2024-01-19 DIAGNOSIS — F10939 Alcohol use, unspecified with withdrawal, unspecified: Secondary | ICD-10-CM | POA: Diagnosis not present

## 2024-01-19 LAB — RENAL FUNCTION PANEL
Albumin: 3.9 g/dL (ref 3.5–5.0)
Anion gap: 10 (ref 5–15)
BUN: 8 mg/dL (ref 6–20)
CO2: 25 mmol/L (ref 22–32)
Calcium: 8.8 mg/dL — ABNORMAL LOW (ref 8.9–10.3)
Chloride: 99 mmol/L (ref 98–111)
Creatinine, Ser: 0.73 mg/dL (ref 0.61–1.24)
GFR, Estimated: >60 mL/min
Glucose, Bld: 101 mg/dL — ABNORMAL HIGH (ref 70–99)
Phosphorus: 3.7 mg/dL (ref 2.5–4.6)
Potassium: 4.6 mmol/L (ref 3.5–5.1)
Sodium: 135 mmol/L (ref 135–145)

## 2024-01-19 LAB — CBC
HCT: 42.1 % (ref 39.0–52.0)
Hemoglobin: 14.4 g/dL (ref 13.0–17.0)
MCH: 31 pg (ref 26.0–34.0)
MCHC: 34.2 g/dL (ref 30.0–36.0)
MCV: 90.5 fL (ref 80.0–100.0)
Platelets: 184 K/uL (ref 150–400)
RBC: 4.65 MIL/uL (ref 4.22–5.81)
RDW: 13.1 % (ref 11.5–15.5)
WBC: 4.2 K/uL (ref 4.0–10.5)
nRBC: 0 % (ref 0.0–0.2)

## 2024-01-19 SURGERY — ECHOCARDIOGRAM, TRANSESOPHAGEAL
Anesthesia: General

## 2024-01-19 MED ORDER — DILTIAZEM HCL ER COATED BEADS 120 MG PO CP24
240.0000 mg | ORAL_CAPSULE | Freq: Every day | ORAL | Status: DC
  Administered 2024-01-19: 240 mg via ORAL
  Filled 2024-01-19 (×2): qty 2

## 2024-01-19 MED ORDER — METOPROLOL TARTRATE 50 MG PO TABS
100.0000 mg | ORAL_TABLET | Freq: Two times a day (BID) | ORAL | Status: DC
  Administered 2024-01-19: 100 mg via ORAL
  Filled 2024-01-19 (×2): qty 2

## 2024-01-19 NOTE — Plan of Care (Signed)

## 2024-01-19 NOTE — TOC Progression Note (Signed)
 Transition of Care [facility]) - Progression Note    Patient Details  Name: Bobby Williams MRN: [id number] Date of Birth: 1965-04-29  [facility] CM/SW Contact  Shasta DELENA Daring, RN Phone Number: 01/19/2024, 8:33 AM  Clinical Narrative:    Initiated HH search. Will follow   Expected Discharge Plan: Skilled Nursing Facility Barriers to Discharge: Continued Medical Work up               Expected Discharge Plan and Services       Living arrangements for the past 2 months: Single Family Home                                       Social Drivers of Health (SDOH) Interventions SDOH Screenings   Food Insecurity: No Food Insecurity (01/19/2024)  Housing: Low Risk (01/19/2024)  Transportation Needs: No Transportation Needs (01/19/2024)  Utilities: Not At Risk (01/19/2024)  Depression (PHQ2-9): Low Risk (07/08/2021)  Tobacco Use: Medium Risk (01/12/2024)    Readmission Risk Interventions     No data to display

## 2024-01-19 NOTE — Progress Notes (Signed)
 Physical Therapy Treatment Patient Details Name: Bobby Williams MRN: [id number] DOB: [birth year] Today's Date: 01/19/2024   History of Present Illness Pt is a 58 y.o. male with medical history significant of  alcohol  abuse, hypertension, depression with anxiety, thrombocytopenia, presents with alcohol  withdrawal and fall, left forearm skin laceration. MD assessment includes: alcohol  withdrawal, A-fib, fall at home, skin laceration on left forearm.    PT Comments  Pt was pleasant and motivated to participate during the session and put forth good effort throughout. Pt was steady with good cadence during amb without an AD but only with head forward position.  During amb with head turns left/right pt presented with drifting/staggering as well as with significant decrease in cadence and required min A for stability.  Pt put forth good effort with below balance training activities and overall presented with fair to good stability but struggled again with head turns especially with more challenging foot positions and required occasional min A for stability.  Pt's HR was in the low 100s at rest and increased to a high of 157 during balance training and then a high of 148 during ambulation each time returning to the 110's quickly during seated therapeutic rest breaks.  No adverse symptoms noted by the pt, SpO2 WNL throughout on room air.  Pt will benefit from continued PT services upon discharge to safely address deficits listed in patient problem list for decreased caregiver assistance and eventual return to PLOF.      If plan is discharge home, recommend the following: Assistance with cooking/housework;Assist for transportation;A little help with walking and/or transfers;Help with stairs or ramp for entrance   Can travel by private vehicle     Yes  Equipment Recommendations  Rolling walker (2 wheels)    Recommendations for Other Services       Precautions / Restrictions Precautions Precautions:  Fall Recall of Precautions/Restrictions: Intact Restrictions Weight Bearing Restrictions Per Provider Order: No     Mobility  Bed Mobility Overal bed mobility: Independent             General bed mobility comments: Good speed and effort with bed mobility tasks    Transfers Overall transfer level: Needs assistance Equipment used: None Transfers: Sit to/from Stand Sit to Stand: Supervision           General transfer comment: Good eccentric and concentric control and stability with no cues for sequencing needed    Ambulation/Gait Ambulation/Gait assistance: Contact guard assist, Min assist Gait Distance (Feet): 300 Feet Assistive device: None Gait Pattern/deviations: Step-through pattern, Drifts right/left, Staggering right Gait velocity: WFL     General Gait Details: Steady with good cadence with head forward but drifting/staggering and significant decrease in cadence during left/right head turns with min A needed to correct   Stairs             Wheelchair Mobility     Tilt Bed    Modified Rankin (Stroke Patients Only)       Balance Overall balance assessment: Needs assistance   Sitting balance-Leahy Scale: Normal     Standing balance support: No upper extremity supported, During functional activity Standing balance-Leahy Scale: Poor Standing balance comment: Fair to good stability during amb with head forward but poor stability with head turns                            Communication Communication Communication: No apparent difficulties  Cognition Arousal: Alert Behavior During Therapy: [facility]  for tasks assessed/performed   PT - Cognitive impairments: No apparent impairments                         Following commands: Intact      Cueing Cueing Techniques: Verbal cues  Exercises Other Exercises Other Exercises: Static standing balance training with feet apart, together, and semi-tandem with combinations of EO/EC and  head still/head turns Other Exercises: Dynamic standing balance training with reaching activities outside BOS with feet apart, together, and semi-tandem    General Comments        Pertinent Vitals/Pain Pain Assessment Pain Assessment: No/denies pain    Home Living                          Prior Function            PT Goals (current goals can now be found in the care plan section) Progress towards PT goals: Progressing toward goals    Frequency    Min 2X/week      PT Plan      Co-evaluation              AM-PAC PT 6 Clicks Mobility   Outcome Measure  Help needed turning from your back to your side while in a flat bed without using bedrails?: None Help needed moving from lying on your back to sitting on the side of a flat bed without using bedrails?: None Help needed moving to and from a bed to a chair (including a wheelchair)?: A Little Help needed standing up from a chair using your arms (e.g., wheelchair or bedside chair)?: A Little Help needed to walk in hospital room?: A Little Help needed climbing 3-5 steps with a railing? : A Little 6 Click Score: 20    End of Session Equipment Utilized During Treatment: Gait belt Activity Tolerance: Patient tolerated treatment well Patient left: in chair;with call bell/phone within reach;with chair alarm set Nurse Communication: Mobility status PT Visit Diagnosis: Unsteadiness on feet (R26.81);Difficulty in walking, not elsewhere classified (R26.2);History of falling (Z91.81)     Time: 8372-8295 PT Time Calculation (min) (ACUTE ONLY): 37 min  Charges:    $Gait Training: 8-22 mins $Therapeutic Activity: 8-22 mins PT General Charges $$ ACUTE PT VISIT: 1 Visit                     D. Scott Sean Malinowski PT, DPT 01/19/2024, 5:22 PM

## 2024-01-19 NOTE — Progress Notes (Signed)
 "    Progress Note    Bobby Williams  FMW:[id number] DOB: 04-Jan-1966  DOA: 01/12/2024 PCP: Pcp, No      Brief Narrative:    Medical records reviewed and are as summarized below:  Bobby Williams is a 58 y.o. male with medical history significant for alcohol  use disorder, depression, anxiety, thrombocytopenia, hypertension, who presented to the hospital because of a fall and left forearm laceration that occurred about 2 days prior to admission.  He drinks about 4-5 bottles of wine daily.  When EMS arrived, apparently his heart rate was between 170-200.   Vitals in the ED: Temperature 97.5, respiratory rate 19, pulse 175, BP 121/82, oxygen saturation 92% on room air  He was found to have atrial fibrillation with RVR and alcohol  withdrawal syndrome.    Assessment/Plan:   Principal Problem:   Alcohol  withdrawal (HCC) Active Problems:   Tachycardia   Atrial fibrillation with RVR (HCC)   Fall at home, initial encounter with skin laceration in left forearm   HTN (hypertension)   Metabolic acidosis   Hypokalemia   Abnormal LFTs   Thrombocytopenia   Depression with anxiety   Obesity (BMI 30-39.9)   Arm laceration, left, initial encounter   Typical atrial flutter (HCC)    Body mass index is 29.84 kg/m.    Atrial fibrillation with RVR: He has persistent tachycardia that is worse with activity on ambulation with heart rate up into the 140s and briefly up to the 170 Continue Eliquis , metoprolol , diltiazem . Plan for cardioversion tomorrow.  Follow-up with cardiologist. He was previously treated with amiodarone  and IV heparin  drip but these were discontinued.  Heparin  was discontinued because of significant thrombocytopenia and amiodarone  was discontinued because he was no longer on anticoagulation.   Alcohol  withdrawal syndrome, delirium: Improved.    Acute on chronic thrombocytopenia: Platelet count has normalized.  It went as low as 40,000.     Hypokalemia: Improved.    Hyponatremia: Improved.   Elevated liver enzymes: Probably from alcohol  use disorder and fatty liver.   General weakness, s/p fall: PT recommended home health therapy. Left foot wound skin laceration: Continue local wound care   Erythematous maculopapular itchy rash on the back, probable miliaria: Apply calamine lotion to rash.   Comorbidities include depression, anxiety, hypertension     Diet Order             Diet NPO time specified Except for: Sips with Meds  Diet effective midnight           Diet Heart Fluid consistency: Thin  Diet effective now                                  Consultants: Cardiologist  Procedures: None    Medications:    apixaban   5 mg Oral BID   calamine  1 Application Topical BID   diltiazem   240 mg Oral Daily   folic acid   1 mg Oral Daily   LORazepam   1 mg Intravenous Q6H   metoprolol  tartrate  100 mg Oral BID   multivitamin with minerals  1 tablet Oral Daily   thiamine   100 mg Oral Daily   Or   thiamine   100 mg Intravenous Daily   Continuous Infusions:   Anti-infectives (From admission, onward)    None              Family Communication/Anticipated D/C date and plan/Code Status  DVT prophylaxis: Place and maintain sequential compression device Start: 01/14/24 1214 SCDs Start: 01/12/24 2043 apixaban  (ELIQUIS ) tablet 5 mg     Code Status: Full Code  Family Communication: None Disposition Plan: Plan to discharge to SNF   Status is: Inpatient Remains inpatient appropriate because: A-fib with RVR       Subjective:   Interval events noted.  No shortness of breath or chest pain.  Rash on the back is less itchy.  His heart rate was elevated into the 140s when he walked with the therapist this morning.  Objective:    Vitals:   01/19/24 0611 01/19/24 0756 01/19/24 1100 01/19/24 1141  BP: 127/85 117/77  118/78  Pulse: 61 92 (!) 111 (!) 108  Resp:  17  19  Temp:  98.2 F (36.8 C)   98.6 F (37 C)  TempSrc:      SpO2:  98%  98%  Weight:      Height:       No data found.   Intake/Output Summary (Last 24 hours) at 01/19/2024 1242 Last data filed at 01/19/2024 1140 Gross per 24 hour  Intake 1000 ml  Output 3275 ml  Net -2275 ml   Filed Weights   01/12/24 1900 01/14/24 0048 01/19/24 0500  Weight: 116.6 kg 109.9 kg 111.2 kg    Exam:  GEN: NAD SKIN: Erythematous maculopapular rash on the back EYES: No pallor or icterus ENT: MMM CV: Irregular rate and rhythm, tachycardic PULM: CTA B ABD: soft, ND, NT, +BS CNS: AAO x 3, non focal EXT: No edema or tenderness         Data Reviewed:   I have personally reviewed following labs and imaging studies:  Labs: Labs show the following:   Basic Metabolic Panel: Recent Labs  Lab 01/12/24 1854 01/12/24 1856 01/13/24 0455 01/15/24 0400 01/17/24 0426 01/18/24 0430 01/19/24 0406  NA 136  --  135 131* 134* 134* 135  K 3.3*  --  4.0 3.1* 3.0* 3.7 4.6  CL 88*  --  98 92* 97* 99 99  CO2 18*  --  24 23 23 26 25   GLUCOSE 119*  --  112* 85 97 104* 101*  BUN 11  --  9 7 11 9 8   CREATININE 0.91  --  0.76 0.60* 0.73 0.80 0.73  CALCIUM 8.9  --  8.6* 8.6* 8.7* 8.5* 8.8*  MG  --  2.0  --  2.1 2.1 2.2  --   PHOS 3.5  --   --  2.3* 4.1  --  3.7   GFR Estimated Creatinine Clearance: 139.2 mL/min (by C-G formula based on SCr of 0.73 mg/dL). Liver Function Tests: Recent Labs  Lab 01/12/24 1854 01/13/24 0455 01/15/24 0400 01/17/24 0426 01/19/24 0406  AST 61* 48* 60* 48*  --   ALT 56* 43 57* 51*  --   ALKPHOS 69 56 65 64  --   BILITOT 1.6* 1.1 1.0 0.6  --   PROT 7.8 6.8 6.7 6.6  --   ALBUMIN 4.7 4.1 3.9 3.8 3.9   Recent Labs  Lab 01/12/24 1854  LIPASE 48   No results for input(s): AMMONIA in the last 168 hours. Coagulation profile Recent Labs  Lab 01/13/24 1139  INR 1.0    CBC: Recent Labs  Lab 01/12/24 1854 01/13/24 0455 01/14/24 0242 01/15/24 0400 01/17/24 0426 01/18/24 0430  01/19/24 0406  WBC 10.4   < > 5.7 6.8 5.5 4.7 4.2  NEUTROABS 8.9*  --   --   --   --   --   --  HGB 15.7   < > 13.2 14.2 14.9 14.4 14.4  HCT 43.2   < > 37.8* 40.1 42.2 40.9 42.1  MCV 88.3   < > 90.9 88.7 88.7 89.7 90.5  PLT 82*   < > 40* 53* 105* 150 184   < > = values in this interval not displayed.   Cardiac Enzymes: No results for input(s): CKTOTAL, CKMB, CKMBINDEX, TROPONINI in the last 168 hours. BNP (last 3 results) No results for input(s): PROBNP in the last 8760 hours. CBG: Recent Labs  Lab 01/18/24 2136  GLUCAP 98   D-Dimer: No results for input(s): DDIMER in the last 72 hours. Hgb A1c: No results for input(s): HGBA1C in the last 72 hours. Lipid Profile: No results for input(s): CHOL, HDL, LDLCALC, TRIG, CHOLHDL, LDLDIRECT in the last 72 hours. Thyroid function studies: No results for input(s): TSH, T4TOTAL, T3FREE, THYROIDAB in the last 72 hours.  Invalid input(s): FREET3 Anemia work up: No results for input(s): VITAMINB12, FOLATE, FERRITIN, TIBC, IRON, RETICCTPCT in the last 72 hours. Sepsis Labs: Recent Labs  Lab 01/15/24 0400 01/17/24 0426 01/18/24 0430 01/19/24 0406  WBC 6.8 5.5 4.7 4.2    Microbiology No results found for this or any previous visit (from the past 240 hours).  Procedures and diagnostic studies:  No results found.             LOS: 7 days   Bobby Williams  [facility]   Pager on [email or web address]. If 7PM-7AM, please contact night-coverage at [email or web address]     01/19/2024, 12:42 PM           "

## 2024-01-19 NOTE — Progress Notes (Signed)
 Occupational Therapy Treatment Patient Details Name: Bobby Williams MRN: [id number] DOB: [birth year] Today's Date: 01/19/2024   History of present illness Pt is a 58 y.o. male with medical history significant of  alcohol  abuse, hypertension, depression with anxiety, thrombocytopenia, presents with alcohol  withdrawal and fall, left forearm skin laceration. MD assessment includes: alcohol  withdrawal, A-fib, fall at home, skin laceration on left forearm.   OT comments  Pt seen for OT treatment session this date. Pt continues to present with decreased judgement and poor safety awareness, often impulsive at times and dismissive of activity recommendations given elevated HR. Pt completes UB bathing + dressing with setup assist, CGA for functional mobility in hallway without AD ~200 ft. HR up to 148-149 bpm while ambulating in hallway, started at 106 bpm and elevated to 149 bpm, recovers back to 113 bpm after several minutes seated rest. Secure chat sent to care team to update, OT will follow acutely. Discharge recommendation appropriate, OT will follow acutely.       If plan is discharge home, recommend the following:  A little help with bathing/dressing/bathroom;A lot of help with walking and/or transfers;A lot of help with bathing/dressing/bathroom;A little help with walking and/or transfers;Assistance with cooking/housework;Help with stairs or ramp for entrance   Equipment Recommendations  Tub/shower seat       Precautions / Restrictions Precautions Precautions: Fall Recall of Precautions/Restrictions: Impaired Restrictions Weight Bearing Restrictions Per Provider Order: No       Mobility Bed Mobility Overal bed mobility: Modified Independent             General bed mobility comments: NT, pt in recliner pre-session    Transfers Overall transfer level: Needs assistance Equipment used: None Transfers: Sit to/from Stand Sit to Stand: Supervision                 Balance  Overall balance assessment: Needs assistance Sitting-balance support: Feet supported Sitting balance-Leahy Scale: Good     Standing balance support: No upper extremity supported, During functional activity Standing balance-Leahy Scale: Good Standing balance comment: no LOB without use of AD, multiple STS completed                           ADL either performed or assessed with clinical judgement   ADL Overall ADL's : Needs assistance/impaired     Grooming: Wash/dry hands;Wash/dry face;Sitting;Set up;Applying deodorant   Upper Body Bathing: Sitting;Cueing for sequencing;Set up Upper Body Bathing Details (indicate cue type and reason): pt frequently asking now what needing cues for sequencing     Upper Body Dressing : Standing;Set up                   Functional mobility during ADLs: Contact guard assist General ADL Comments: CGA for functional mobility ~200 ft in hallway, no overt LOB but increased difficulties navigating turns and often staggering. HR elevated to 149 bpm with mobility.     Communication Communication Communication: No apparent difficulties   Cognition Arousal: Alert Behavior During Therapy: Impulsive Cognition: No family/caregiver present to determine baseline             OT - Cognition Comments: decreased safety awareness, poor judgement, lacks insight into overall situation                 Following commands: Impaired Following commands impaired: Follows one step commands with increased time      Cueing   Cueing Techniques: Verbal cues, Visual cues, Tactile cues  General Comments HR up to 148-149 bpm while ambulating in hallway, started at 106 bpm and elevated to 149 bpm, recovers back to 113 bpm after several minutes seated rest. Did see a brief period while pt was standing with HR elevating to 170 bpm - unsure if this is accurate as we were adjusting his gown/gait belt (HR decreasing after seated rest break).  Secure chat to care team including cardiology to update.    Pertinent Vitals/ Pain       Pain Assessment Pain Assessment: No/denies pain         Frequency  Min 2X/week        Progress Toward Goals  OT Goals(current goals can now be found in the care plan section)  Progress towards OT goals: Progressing toward goals  Acute Rehab OT Goals OT Goal Formulation: With patient Time For Goal Achievement: 01/29/24 Potential to Achieve Goals: Good ADL Goals Pt Will Perform Grooming: with supervision;standing Pt Will Perform Lower Body Dressing: with supervision;with modified independence;sitting/lateral leans;sit to/from stand Pt Will Transfer to Toilet: with supervision;ambulating  Plan         AM-PAC OT 6 Clicks Daily Activity     Outcome Measure   Help from another person eating meals?: None Help from another person taking care of personal grooming?: None Help from another person toileting, which includes using toliet, bedpan, or urinal?: A Little Help from another person bathing (including washing, rinsing, drying)?: A Little Help from another person to put on and taking off regular upper body clothing?: None Help from another person to put on and taking off regular lower body clothing?: A Little 6 Click Score: 21    End of Session Equipment Utilized During Treatment: Gait belt  OT Visit Diagnosis: Other abnormalities of gait and mobility (R26.89);Unsteadiness on feet (R26.81);Muscle weakness (generalized) (M62.81)   Activity Tolerance Patient tolerated treatment well   Patient Left in chair;with call bell/phone within reach;with chair alarm set   Nurse Communication Mobility status        Time: 8954-8883 OT Time Calculation (min): 31 min  Charges: OT General Charges $OT Visit: 1 Visit OT Treatments $Self Care/Home Management : 8-22 mins $Therapeutic Activity: 8-22 mins  Arvilla Salada L. Kalanie Fewell, OTR/L  01/19/2024, 12:50 PM

## 2024-01-19 NOTE — Progress Notes (Signed)
 "  Rounding Note   Patient Name: Bobby Williams Date of Encounter: 01/19/2024  [facility] Cardiologist: [facility]  Subjective No chest pain, dyspnea, palpitations, dizziness, presyncope, or syncope.  Started on apixaban  in 12/22 given improvement in platelet count with stable CBC this morning.  Scheduled Meds:  apixaban   5 mg Oral BID   calamine  1 Application Topical BID   digoxin   0.25 mg Oral Daily   diltiazem   60 mg Oral Q6H   folic acid   1 mg Oral Daily   LORazepam   1 mg Intravenous Q6H   metoprolol  tartrate  75 mg Oral Q6H   multivitamin with minerals  1 tablet Oral Daily   thiamine   100 mg Oral Daily   Or   thiamine   100 mg Intravenous Daily   Continuous Infusions:   PRN Meds: hydrALAZINE , ibuprofen , loperamide , ondansetron  (ZOFRAN ) IV   Vital Signs  Vitals:   01/19/24 0016 01/19/24 0359 01/19/24 0500 01/19/24 0611  BP: 90/64 127/85  127/85  Pulse: 94 61  61  Resp:  18    Temp:  98 F (36.7 C)    TempSrc:      SpO2:  100%    Weight:   111.2 kg   Height:        Intake/Output Summary (Last 24 hours) at 01/19/2024 0719 Last data filed at 01/19/2024 0600 Gross per 24 hour  Intake 760 ml  Output 2925 ml  Net -2165 ml      01/19/2024    5:00 AM 01/14/2024   12:48 AM 01/12/2024    7:00 PM  Last 3 Weights  Weight (lbs) 245 lb 2.4 oz 242 lb 4.6 oz 257 lb  Weight (kg) 111.2 kg 109.9 kg 116.574 kg      Telemetry Afib with ventricular rates largely in the 90s bpm with brief paroxysms into the 150s to 160s bpm - Personally Reviewed  ECG  No new tracings - Personally Reviewed  Physical Exam  GEN: No acute distress.   Neck: No JVD. Cardiac: IRIR, no murmurs, rubs, or gallops.  Respiratory: Clear to auscultation bilaterally.  GI: Soft, nontender, non-distended.   MS: No edema; No deformity. Neuro:  Alert and oriented x 3; Nonfocal.  Psych: Normal affect.  Labs High Sensitivity Troponin:  No results for input(s): TROPONINIHS in the last 720  hours. No results for input(s): TRNPT in the last 720 hours.     Chemistry Recent Labs  Lab 01/13/24 0455 01/15/24 0400 01/17/24 0426 01/18/24 0430 01/19/24 0406  NA 135 131* 134* 134* 135  K 4.0 3.1* 3.0* 3.7 4.6  CL 98 92* 97* 99 99  CO2 24 23 23 26 25   GLUCOSE 112* 85 97 104* 101*  BUN 9 7 11 9 8   CREATININE 0.76 0.60* 0.73 0.80 0.73  CALCIUM 8.6* 8.6* 8.7* 8.5* 8.8*  MG  --  2.1 2.1 2.2  --   PROT 6.8 6.7 6.6  --   --   ALBUMIN 4.1 3.9 3.8  --  3.9  AST 48* 60* 48*  --   --   ALT 43 57* 51*  --   --   ALKPHOS 56 65 64  --   --   BILITOT 1.1 1.0 0.6  --   --   GFRNONAA >60 >60 >60 >60 >60  ANIONGAP 14 16* 15 9 10     Lipids No results for input(s): CHOL, TRIG, HDL, LABVLDL, LDLCALC, CHOLHDL in the last 168 hours.  Hematology Recent Labs  Lab 01/17/24 0426 01/18/24 0430 01/19/24 0406  WBC 5.5 4.7 4.2  RBC 4.76 4.56 4.65  HGB 14.9 14.4 14.4  HCT 42.2 40.9 42.1  MCV 88.7 89.7 90.5  MCH 31.3 31.6 31.0  MCHC 35.3 35.2 34.2  RDW 12.5 12.9 13.1  PLT 105* 150 184   Thyroid No results for input(s): TSH, FREET4 in the last 168 hours.  BNPNo results for input(s): BNP, PROBNP in the last 168 hours.  DDimer No results for input(s): DDIMER in the last 168 hours.   Radiology  No results found.   Cardiac Studies 2D echo 01/14/2024: 1. Left ventricular ejection fraction, by estimation, is 55 to 60%. The  left ventricle has normal function. The left ventricle has no regional  wall motion abnormalities. Left ventricular diastolic parameters are  indeterminate.   2. Right ventricular systolic function is normal. The right ventricular  size is normal. Tricuspid regurgitation signal is inadequate for assessing  PA pressure.   3. The mitral valve is normal in structure. No evidence of mitral valve  regurgitation. No evidence of mitral stenosis.   4. The aortic valve is normal in structure. Aortic valve regurgitation is  not visualized. No aortic  stenosis is present.   5. The inferior vena cava is normal in size with greater than 50%  respiratory variability, suggesting right atrial pressure of 3 mmHg.   6. Rhythm is atrial fibrillation   Patient Profile   Bobby Williams is a 58 y.o. male with a hx of hypertension, thrombocytopenia, depression with anxiety, and alcohol  abuse who is being seen for the evaluation of new onset atrial fibrillation with RVR.  Assessment & Plan  Newly diagnosed atrial fibrillation with RVR -In the setting of alcohol  abuse, alcohol  withdrawal -Chronicity unclear, was tachycardic upon EMS arrival after fall -Was initially placed on amiodarone , this was discontinued as he is not on anticoagulation -Heparin  initiated but held secondary to thrombocytopenia -Started on apixaban  on 12/22 given normalization of platelet count -Remains on diltiazem  60 mg every 6 hours, metoprolol  tartrate 75 mg every 6 hours, and digoxin  0.25 mg daily -Consolidate pharmacotherapy as able -Relative hypotension precludes further escalation  -Given difficulty in controlling ventricular rates, he may ultimately require TEE guided DCCV prior to discharge, once we have established he is tolerating anticoagulation, possibly as early as 12/24 -Will make NPO at midnight for possible TEE/DCCV on 12/24, will discuss with MD -Recent TSH mildly elevated at 6.605 with a normal free T4 at that time -CHADS2VASc at least 1 (HTN) -Potassium repleted  -Magnesium at goal -With underlying thrombocytopenia, recommend EP evaluation as an outpatient for consideration of LAA occluder device, though this would require short term OAC   Alcohol  abuse/withdrawal -Long history of alcohol  abuse -Drinks 4-5 bottles of wine per day -Currently on CIWA protocol, more alert -Would likely benefit from alcohol  rehab placement   Essential hypertension -Continue diltiazem  60 every 6 hours -Metoprolol  tartrate 75 every 6 hours,  -Blood pressure soft, though  stable   Thrombocytopenia -In the setting of alcohol  abuse, liver dysfunction with elevated LFTs leading heparin  infusion to be held earlier in the admission -Resolved, monitor following initiation of apixaban  -High risk of bleeding, unable to exclude esophageal varices and other complications from chronic alcohol  abuse -Possibly a poor candidate for long-term anticoagulation if he continues to drink  Delirium -In the setting of alcohol  withdrawal -Improved    Informed Consent   Shared Decision Making/Informed Consent{  The risks [stroke, cardiac arrhythmias rarely resulting in  the need for a temporary or permanent pacemaker, skin irritation or burns, esophageal damage, perforation (1:10,000 risk), bleeding, pharyngeal hematoma as well as other potential complications associated with conscious sedation including aspiration, arrhythmia, respiratory failure and death], benefits (treatment guidance, restoration of normal sinus rhythm, diagnostic support) and alternatives of a transesophageal echocardiogram guided cardioversion were discussed in detail with Mr. Humbarger and he is willing to proceed.     For questions or updates, please contact [facility] Please consult [email or web address] for contact info under    Signed, Bernardino Bring, PA-C  01/19/2024, 7:19 AM    "

## 2024-01-20 ENCOUNTER — Inpatient Hospital Stay
Admit: 2024-01-20 | Discharge: 2024-01-20 | Disposition: A | Attending: Physician Assistant | Admitting: Physician Assistant

## 2024-01-20 ENCOUNTER — Encounter
Admission: EM | Discharge status: Against Medical Advice | Payer: Self-pay | Source: Home / Self Care | Attending: Family Medicine

## 2024-01-20 ENCOUNTER — Inpatient Hospital Stay: Payer: Self-pay | Admitting: Anesthesiology

## 2024-01-20 DIAGNOSIS — R000 Tachycardia, unspecified: Secondary | ICD-10-CM

## 2024-01-20 DIAGNOSIS — I4892 Unspecified atrial flutter: Secondary | ICD-10-CM

## 2024-01-20 DIAGNOSIS — I10 Essential (primary) hypertension: Secondary | ICD-10-CM

## 2024-01-20 DIAGNOSIS — F10939 Alcohol use, unspecified with withdrawal, unspecified: Secondary | ICD-10-CM

## 2024-01-20 DIAGNOSIS — I4891 Unspecified atrial fibrillation: Secondary | ICD-10-CM | POA: Diagnosis not present

## 2024-01-20 HISTORY — PX: CARDIOVERSION: SHX1299

## 2024-01-20 HISTORY — PX: TEE WITHOUT CARDIOVERSION: SHX5443

## 2024-01-20 LAB — GLUCOSE, CAPILLARY
Glucose-Capillary: 109 mg/dL — ABNORMAL HIGH (ref 70–99)
Glucose-Capillary: 112 mg/dL — ABNORMAL HIGH (ref 70–99)

## 2024-01-20 LAB — ECHO TEE

## 2024-01-20 SURGERY — CARDIOVERSION
Anesthesia: General

## 2024-01-20 MED ORDER — MIDAZOLAM HCL (PF) 2 MG/2ML IJ SOLN
INTRAMUSCULAR | Status: DC | PRN
  Administered 2024-01-20: 2 mg via INTRAVENOUS

## 2024-01-20 MED ORDER — LIDOCAINE HCL (PF) 2 % IJ SOLN
INTRAMUSCULAR | Status: DC | PRN
  Administered 2024-01-20: 100 mg via INTRADERMAL

## 2024-01-20 MED ORDER — MIDAZOLAM HCL 2 MG/2ML IJ SOLN
INTRAMUSCULAR | Status: AC
  Filled 2024-01-20: qty 2

## 2024-01-20 MED ORDER — DEXMEDETOMIDINE HCL IN NACL 80 MCG/20ML IV SOLN
INTRAVENOUS | Status: DC | PRN
  Administered 2024-01-20: 20 ug via INTRAVENOUS

## 2024-01-20 MED ORDER — PHENYLEPHRINE 80 MCG/ML (10ML) SYRINGE FOR IV PUSH (FOR BLOOD PRESSURE SUPPORT)
PREFILLED_SYRINGE | INTRAVENOUS | Status: DC | PRN
  Administered 2024-01-20: 80 ug via INTRAVENOUS
  Administered 2024-01-20: 160 ug via INTRAVENOUS
  Administered 2024-01-20: 80 ug via INTRAVENOUS

## 2024-01-20 MED ORDER — LIDOCAINE VISCOUS HCL 2 % MT SOLN
OROMUCOSAL | Status: AC
  Filled 2024-01-20: qty 15

## 2024-01-20 MED ORDER — SODIUM CHLORIDE 0.9 % IV SOLN: INTRAVENOUS | Status: DC

## 2024-01-20 MED ORDER — PROPOFOL 10 MG/ML IV BOLUS
INTRAVENOUS | Status: DC | PRN
  Administered 2024-01-20: 50 mg via INTRAVENOUS
  Administered 2024-01-20: 10 mg via INTRAVENOUS
  Administered 2024-01-20 (×2): 20 mg via INTRAVENOUS
  Administered 2024-01-20 (×4): 50 mg via INTRAVENOUS

## 2024-01-20 MED ORDER — BUTAMBEN-TETRACAINE-BENZOCAINE 2-2-14 % EX AERO
INHALATION_SPRAY | CUTANEOUS | Status: AC
  Filled 2024-01-20: qty 5

## 2024-01-20 MED ORDER — DEXMEDETOMIDINE HCL IN NACL 80 MCG/20ML IV SOLN
INTRAVENOUS | Status: AC
  Filled 2024-01-20: qty 20

## 2024-01-20 NOTE — Discharge Summary (Signed)
 " Physician Discharge Summary   Patient: Bobby Williams MRN: [id number] DOB: [birth year]  Admit date:     01/12/2024  Discharge date: 01/20/2024  Discharge Physician: Drue ONEIDA Potter   PCP: Pcp, No   Recommendations at discharge:  Follow-up with cardiology  Discharge Diagnoses:  Atrial fibrillation with RVR:  Alcohol  withdrawal syndrome, delirium: Improved.  Acute on chronic thrombocytopenia: Platelet count has normalized.   Hypokalemia: Improved.   Hyponatremia: Improved. Elevated liver enzymes:  General weakness, s/p fall Left foot wound skin laceration:  Erythematous maculopapular itchy rash on the back, probable miliaria: Depression/anxiety Hypertension    Hospital Course:   Bobby Williams is a 58 y.o. male with medical history significant for alcohol  use disorder, depression, anxiety, thrombocytopenia, hypertension, who presented to the hospital because of a fall and left forearm laceration that occurred about 2 days prior to admission.  He drinks about 4-5 bottles of wine daily.  When EMS arrived, apparently his heart rate was between 170-200.   Vitals in the ED: Temperature 97.5, respiratory rate 19, pulse 175, BP 121/82, oxygen saturation 92% on room air   He was found to have atrial fibrillation with RVR and alcohol  withdrawal syndrome. Patient was seen by cardiologist and was managed with Cardizem  as well as metoprolol  however rate was still not controlled and subsequently underwent cardioversion on 01/20/2024.  Cardioversion was successful.  Plans were underway to discharge patient however he could not wait for his discharge to be completed and left the hospital AGAINST MEDICAL ADVICE.  He did not wait for his home medication to be reconciled.  I tried calling him several times to discharge his home medications with him however he did not answer on his mobile phone on [phone]. Hopefully he follows up with cardiologist  Consultants: Cardiology Procedures performed:  Cardioversion Disposition: Home Diet recommendation:  Cardiac diet DISCHARGE MEDICATION: Patient did not wait for his medications  Follow-up Information     Riddle, Suzann, NP Follow up on 02/04/2024.   Specialty: Cardiology Why: 9:25 AM Contact information: [address] [place] [address] [phone]                Discharge Exam: Bobby Williams   01/12/24 1900 01/14/24 0048 01/19/24 0500  Weight: 116.6 kg 109.9 kg 111.2 kg    GEN: Middle-age Caucasian male laying in bed in no acute distress SKIN: Erythematous maculopapular rash on the back EYES: No pallor or icterus ENT: MMM CV: Irregular rate and rhythm, tachycardic PULM: CTA B ABD: soft, ND, NT, +BS CNS: AAO x 3, non focal EXT: No edema or tenderness    Condition at discharge: good  The results of significant diagnostics from this hospitalization (including imaging, microbiology, ancillary and laboratory) are listed below for reference.   Imaging Studies: ECHO TEE Result Date: 01/20/2024    TRANSESOPHOGEAL ECHO REPORT   Patient Name:   Bobby Williams Bobby Williams Date of Exam: 01/20/2024 Medical Rec #:  [id number]      Height:       76.0 in Accession #:    [id number]     Weight:       245.1 lb Date of Birth:  [birth year]      BSA:          2.415 m Patient Age:    58 years       BP:           105/76 mmHg Patient Gender: M  HR:           104 bpm. Exam Location:  [facility] Procedure: Transesophageal Echo, Color Doppler and Cardiac Doppler (Both            Spectral and Color Flow Doppler were utilized during procedure). Indications:     Atrial Flutter I48.92  History:         Patient has prior history of Echocardiogram examinations, most                  recent 01/14/2024. Risk Factors:Hypertension. Alcohol  abuse.  Sonographer:     Christopher Furnace Referring Phys:  [id number] RYAN M DUNN Diagnosing Phys: Caron Poser PROCEDURE: The transesophogeal probe was passed without difficulty through the esophogus of the patient.  Imaged were obtained with the patient in a left lateral decubitus position. Sedation performed by different physician. The patient developed no complications during the procedure. A successful direct current cardioversion was performed at 200 joules with 1 attempt.  IMPRESSIONS  1. Left ventricular ejection fraction, by estimation, is 55 to 60%. The left ventricle has normal function.  2. No left atrial/left atrial appendage thrombus was detected.  3. The mitral valve is normal in structure. Trivial mitral valve regurgitation. No evidence of mitral stenosis.  4. The aortic valve is tricuspid. Aortic valve regurgitation is not visualized. No aortic stenosis is present. Conclusion(s)/Recommendation(s): No LA/LAA thrombus identified. Successful cardioversion performed with restoration of normal sinus rhythm. FINDINGS  Left Ventricle: Left ventricular ejection fraction, by estimation, is 55 to 60%. The left ventricle has normal function. Left Atrium: Left atrial size was normal in size. No left atrial/left atrial appendage thrombus was detected. Right Atrium: Right atrial size was not assessed. Pericardium: There is no evidence of pericardial effusion. Mitral Valve: The mitral valve is normal in structure. Trivial mitral valve regurgitation. No evidence of mitral valve stenosis. Tricuspid Valve: The tricuspid valve is normal in structure. Tricuspid valve regurgitation is trivial. No evidence of tricuspid stenosis. Aortic Valve: The aortic valve is tricuspid. Aortic valve regurgitation is not visualized. No aortic stenosis is present. Pulmonic Valve: The pulmonic valve was not assessed. Pulmonic valve regurgitation is not visualized. No evidence of pulmonic stenosis. Aorta: There is minimal (Grade I) plaque. IAS/Shunts: The interatrial septum was not assessed. Caron Poser Electronically signed by Caron Poser Signature Date/Time: 01/20/2024/9:50:36 AM    Final    ECHOCARDIOGRAM COMPLETE Result Date: 01/14/2024     ECHOCARDIOGRAM REPORT   Patient Name:   Bobby Williams Date of Exam: 01/14/2024 Medical Rec #:  [id number]      Height:       76.0 in Accession #:    [id number]     Weight:       242.3 lb Date of Birth:  [birth year]      BSA:          2.403 m Patient Age:    58 years       BP:           137/100 mmHg Patient Gender: M              HR:           74 bpm. Exam Location:  [facility] Procedure: 2D Echo, Cardiac Doppler, Color Doppler and Intracardiac            Opacification Agent (Both Spectral and Color Flow Doppler were            utilized during procedure). Indications:     Atrial fibrillation  History:         Patient has no prior history of Echocardiogram examinations.                  Arrythmias:Atrial Fibrillation; Risk Factors:Hypertension.  Sonographer:     Philomena Daring Referring Phys:  [id number] [facility] CAREY Diagnosing Phys: Evalene Lunger MD  Sonographer Comments: Image acquisition challenging due to respiratory motion. IMPRESSIONS  1. Left ventricular ejection fraction, by estimation, is 55 to 60%. The left ventricle has normal function. The left ventricle has no regional wall motion abnormalities. Left ventricular diastolic parameters are indeterminate.  2. Right ventricular systolic function is normal. The right ventricular size is normal. Tricuspid regurgitation signal is inadequate for assessing PA pressure.  3. The mitral valve is normal in structure. No evidence of mitral valve regurgitation. No evidence of mitral stenosis.  4. The aortic valve is normal in structure. Aortic valve regurgitation is not visualized. No aortic stenosis is present.  5. The inferior vena cava is normal in size with greater than 50% respiratory variability, suggesting right atrial pressure of 3 mmHg.  6. Rhythm is atrial fibrillation FINDINGS  Left Ventricle: Left ventricular ejection fraction, by estimation, is 55 to 60%. The left ventricle has normal function. The left ventricle has no regional wall motion abnormalities. Definity   contrast agent was given IV to delineate the left ventricular  endocardial borders. Strain was performed and the global longitudinal strain is indeterminate. The left ventricular internal cavity size was normal in size. There is no left ventricular hypertrophy. Left ventricular diastolic parameters are indeterminate. Right Ventricle: The right ventricular size is normal. No increase in right ventricular wall thickness. Right ventricular systolic function is normal. Tricuspid regurgitation signal is inadequate for assessing PA pressure. Left Atrium: Left atrial size was normal in size. Right Atrium: Right atrial size was normal in size. Pericardium: There is no evidence of pericardial effusion. Mitral Valve: The mitral valve is normal in structure. No evidence of mitral valve regurgitation. No evidence of mitral valve stenosis. Tricuspid Valve: The tricuspid valve is normal in structure. Tricuspid valve regurgitation is not demonstrated. No evidence of tricuspid stenosis. Aortic Valve: The aortic valve is normal in structure. Aortic valve regurgitation is not visualized. No aortic stenosis is present. Pulmonic Valve: The pulmonic valve was normal in structure. Pulmonic valve regurgitation is not visualized. No evidence of pulmonic stenosis. Aorta: The aortic root is normal in size and structure. Venous: The inferior vena cava is normal in size with greater than 50% respiratory variability, suggesting right atrial pressure of 3 mmHg. IAS/Shunts: No atrial level shunt detected by color flow Doppler. Additional Comments: 3D was performed not requiring image post processing on an independent workstation and was indeterminate.  LEFT VENTRICLE PLAX 2D LVIDd:         3.80 cm   Diastology LVIDs:         2.60 cm   LV e' medial:    11.30 cm/s LV PW:         1.00 cm   LV E/e' medial:  7.9 LV IVS:        1.20 cm   LV e' lateral:   14.40 cm/s LVOT diam:     2.10 cm   LV E/e' lateral: 6.2 LV SV:         46 LV SV Index:   19 LVOT  Area:     3.46 cm  RIGHT VENTRICLE  IVC RV S prime:     10.20 cm/s  IVC diam: 2.00 cm TAPSE (M-mode): 2.1 cm LEFT ATRIUM             Index        RIGHT ATRIUM           Index LA diam:        3.20 cm 1.33 cm/m   RA Area:     18.60 cm LA Vol (A2C):   47.0 ml 19.56 ml/m  RA Volume:   45.60 ml  18.97 ml/m LA Vol (A4C):   39.6 ml 16.48 ml/m LA Biplane Vol: 42.9 ml 17.85 ml/m  AORTIC VALVE LVOT Vmax:   95.40 cm/s LVOT Vmean:  63.900 cm/s LVOT VTI:    0.133 m  AORTA Ao Root diam: 3.00 cm MITRAL VALVE MV Area (PHT): 5.02 cm    SHUNTS MV Decel Time: 151 msec    Systemic VTI:  0.13 m MV E velocity: 89.10 cm/s  Systemic Diam: 2.10 cm Evalene Lunger MD Electronically signed by Evalene Lunger MD Signature Date/Time: 01/14/2024/2:24:41 PM    Final    CT HEAD WO CONTRAST ( ) Result Date: 01/13/2024 EXAM: CT HEAD WITHOUT CONTRAST 01/13/2024 03:23:59 AM TECHNIQUE: CT of the head was performed without the administration of intravenous contrast. Automated exposure control, iterative reconstruction, and/or weight based adjustment of the mA/kV was utilized to reduce the radiation dose to as low as reasonably achievable. COMPARISON: None available. CLINICAL HISTORY: Head trauma, moderate-severe. FINDINGS: BRAIN AND VENTRICLES: No acute hemorrhage. No evidence of acute infarct. No hydrocephalus. No extra-axial collection. No mass effect or midline shift. Parenchymal volume loss is commensurate with the patient's age. Periventricular white matter changes are present likely reflecting the sequela of small vessel ischemia. ORBITS: No acute abnormality. SINUSES: No acute abnormality. SOFT TISSUES AND SKULL: No acute soft tissue abnormality. No skull fracture. IMPRESSION: 1. No acute intracranial abnormality. 2. Parenchymal volume loss. 3. Periventricular white matter changes consistent with chronic small vessel ischemia. Electronically signed by: Dorethia Molt MD 01/13/2024 03:29 AM EST RP Workstation: [id number]    DG Chest Portable 1 View Result Date: 01/12/2024 CLINICAL DATA:  Palpitation. EXAM: PORTABLE CHEST 1 VIEW COMPARISON:  Chest radiograph dated 11/29/2023. FINDINGS: The heart size and mediastinal contours are within normal limits. Both lungs are clear. The visualized skeletal structures are unremarkable. IMPRESSION: No active disease. Electronically Signed   By: Vanetta Chou M.D.   On: 01/12/2024 19:33    Microbiology: Results for orders placed or performed during the hospital encounter of 11/29/23  MRSA Next Gen by PCR, Nasal     Status: None   Collection Time: 11/30/23 12:12 AM   Specimen: Nasal Mucosa; Nasal Swab  Result Value Ref Range Status   MRSA by PCR Next Gen NOT DETECTED NOT DETECTED Final    Comment: (NOTE) The GeneXpert MRSA Assay (FDA approved for NASAL specimens only), is one component of a comprehensive MRSA colonization surveillance program. It is not intended to diagnose MRSA infection nor to guide or monitor treatment for MRSA infections. Test performance is not FDA approved in patients less than 62 years old. Performed at [facility], [address] [place], [place] [address]     Labs: CBC: Recent Labs  Lab 01/14/24 0242 01/15/24 0400 01/17/24 0426 01/18/24 0430 01/19/24 0406  WBC 5.7 6.8 5.5 4.7 4.2  HGB 13.2 14.2 14.9 14.4 14.4  HCT 37.8* 40.1 42.2 40.9 42.1  MCV 90.9 88.7 88.7 89.7 90.5  PLT 40*  53* 105* 150 184   Basic Metabolic Panel: Recent Labs  Lab 01/15/24 0400 01/17/24 0426 01/18/24 0430 01/19/24 0406  NA 131* 134* 134* 135  K 3.1* 3.0* 3.7 4.6  CL 92* 97* 99 99  CO2 23 23 26 25   GLUCOSE 85 97 104* 101*  BUN 7 11 9 8   CREATININE 0.60* 0.73 0.80 0.73  CALCIUM 8.6* 8.7* 8.5* 8.8*  MG 2.1 2.1 2.2  --   PHOS 2.3* 4.1  --  3.7   Liver Function Tests: Recent Labs  Lab 01/15/24 0400 01/17/24 0426 01/19/24 0406  AST 60* 48*  --   ALT 57* 51*  --   ALKPHOS 65 64  --   BILITOT 1.0 0.6  --   PROT 6.7 6.6  --    ALBUMIN 3.9 3.8 3.9   CBG: Recent Labs  Lab 01/18/24 2136 01/20/24 0250 01/20/24 0725  GLUCAP 98 109* 112*    Discharge time spent:  37 minutes.  Signed: Drue ONEIDA Potter, MD [facility] 01/20/2024 "

## 2024-01-20 NOTE — Anesthesia Postprocedure Evaluation (Signed)
"   Anesthesia Post Note  Patient: TAOS TAPP  Procedure(s) Performed: CARDIOVERSION ECHOCARDIOGRAM, TRANSESOPHAGEAL ECHO TEE  Patient location during evaluation: PACU Anesthesia Type: General Level of consciousness: awake and alert Pain management: pain level controlled Vital Signs Assessment: post-procedure vital signs reviewed and stable Respiratory status: spontaneous breathing, nonlabored ventilation and respiratory function stable Cardiovascular status: blood pressure returned to baseline and stable Postop Assessment: no apparent nausea or vomiting Anesthetic complications: no   There were no known notable events for this encounter.   Last Vitals:  Vitals:   01/20/24 0954 01/20/24 0954  BP: 96/74 96/74  Pulse: 88 90  Resp:    Temp: 36.7 C 36.7 C  SpO2: 100% 100%    Last Pain:  Vitals:   01/20/24 0900  TempSrc: Temporal  PainSc: 0-No pain                 Camellia Merilee Louder      "

## 2024-01-20 NOTE — CV Procedure (Signed)
" ° °  TRANSESOPHAGEAL ECHOCARDIOGRAM GUIDED DIRECT CURRENT CARDIOVERSION  NAME:  Bobby Williams    MRN: [id number] DOB:  [birth year]    ADMIT DATE: 01/12/2024  INDICATIONS: Symptomatic atrial fibrillation  PROCEDURE:   Informed consent was obtained prior to the procedure. The risks, benefits and alternatives for the procedure were discussed and the patient comprehended these risks.  Risks include, but are not limited to, cough, sore throat, vomiting, nausea, somnolence, esophageal and stomach trauma or perforation, bleeding, low blood pressure, aspiration, pneumonia, infection, trauma to the teeth and death.    After a procedural time-out, the oropharynx was anesthetized and the patient was sedated by the anesthesia service. The transesophageal probe was inserted in the esophagus and stomach without difficulty and multiple views were obtained.  COMPLICATIONS:    Complications: No complications Patient tolerated procedure well.  KEY FINDINGS:  No thrombus in LAA. Full Report to follow.   CARDIOVERSION:     Indications:  Symptomatic Atrial Fibrillation  Procedure Details:  Once the TEE was complete, the patient had the defibrillator pads placed in the anterior and posterior position. Once an appropriate level of sedation was confirmed, the patient was cardioverted x 1 with 200J of biphasic synchronized energy.  The patient converted to NSR.  There were no apparent complications.  The patient had normal neuro status and respiratory status post procedure with vitals stable as recorded elsewhere.  Adequate airway was maintained throughout and vital signs monitored per protocol.  Signed, Caron Poser, MD      "

## 2024-01-20 NOTE — Plan of Care (Signed)

## 2024-01-20 NOTE — Progress Notes (Signed)
*  PRELIMINARY RESULTS* Echocardiogram Echocardiogram Transesophageal has been performed.  Floydene Harder 01/20/2024, 8:33 AM

## 2024-01-20 NOTE — H&P (Signed)
 History and Physical Interval Note:  01/20/2024 8:33 AM  Bobby Williams  has presented today for procedure, with the diagnosis of persistent atrial fibrillation.  The various methods of treatment have been discussed with the patient and family. After consideration of risks, benefits and other options for treatment, the patient has consented to  Procedures: CARDIOVERSION (N/A) ECHOCARDIOGRAM, TRANSESOPHAGEAL (N/A) as a diagnostic and therapeutic intervention.  The patient's history has been reviewed, patient examined, no change in status, stable for procedure.  I have reviewed the patient's chart and labs.  Questions were answered to the patient's satisfaction.     Caron [facility]

## 2024-01-20 NOTE — Anesthesia Preprocedure Evaluation (Addendum)
"                                    Anesthesia Evaluation  Patient identified by MRN, date of birth, ID band Patient awake    Reviewed: Allergy & Precautions, H&P , NPO status , Patient's Chart, lab work & pertinent test results  Airway Mallampati: II  TM Distance: >3 FB Neck ROM: full    Dental no notable dental hx.    Pulmonary neg pulmonary ROS   Pulmonary exam normal        Cardiovascular hypertension, + dysrhythmias Atrial Fibrillation  Rhythm:Irregular Rate:Tachycardia - Peripheral Edema    Neuro/Psych  PSYCHIATRIC DISORDERS Anxiety     negative neurological ROS     GI/Hepatic negative GI ROS,,,(+)     substance abuse  alcohol  use  Endo/Other  negative endocrine ROS    Renal/GU negative Renal ROS  negative genitourinary   Musculoskeletal   Abdominal Normal abdominal exam  (+)   Peds  Hematology  (+) Blood dyscrasia (normalized thrombocytopenia)   Anesthesia Other Findings General weakness, s/p fall  Past Medical History: No date: Alcohol  abuse No date: Anxiety No date: Depression with anxiety No date: Hypertension No date: Pneumonia No date: Thrombocytopenia  Past Surgical History: No date: NO PAST SURGERIES  BMI    Body Mass Index: 29.84 kg/m      Reproductive/Obstetrics negative OB ROS                              Anesthesia Physical Anesthesia Plan  ASA: 3  Anesthesia Plan: General   Post-op Pain Management: Minimal or no pain anticipated   Induction: Intravenous  PONV Risk Score and Plan: Propofol  infusion and TIVA  Airway Management Planned:   Additional Equipment:   Intra-op Plan:   Post-operative Plan:   Informed Consent: I have reviewed the patients History and Physical, chart, labs and discussed the procedure including the risks, benefits and alternatives for the proposed anesthesia with the patient or authorized representative who has indicated his/her understanding and  acceptance.     Dental Advisory Given  Plan Discussed with: CRNA and Surgeon  Anesthesia Plan Comments:          Anesthesia Quick Evaluation  "

## 2024-01-20 NOTE — Transfer of Care (Signed)
 Immediate Anesthesia Transfer of Care Note  Patient: Bobby Williams  Procedure(s) Performed: CARDIOVERSION ECHOCARDIOGRAM, TRANSESOPHAGEAL ECHO TEE  Patient Location: Cath Lab  Anesthesia Type:General  Level of Consciousness: drowsy  Airway & Oxygen Therapy: Patient Spontanous Breathing and Patient connected to nasal cannula oxygen  Post-op Assessment: Report given to RN and Post -op Vital signs reviewed and stable  Post vital signs: Reviewed and stable  Last Vitals:  Vitals Value Taken Time  BP 112/74 01/20/24 08:27  Temp    Pulse 63 01/20/24 08:29  Resp 24 01/20/24 08:29  SpO2 98 % 01/20/24 08:29    Last Pain:  Vitals:   01/20/24 0754  TempSrc: Temporal  PainSc: 0-No pain      Patients Stated Pain Goal: 0 (01/17/24 0148)  Complications: There were no known notable events for this encounter.

## 2024-01-20 NOTE — Progress Notes (Signed)
 Pt up dressed states he is leaving to go Christmas shopping. DR. Argentina and Dr.Djan advised. Pt gathering his belongings, cell phone, charger, beard trimmer/charger, jacket, and pants. Pt states not wearing his watch to hospital and ex wife has his wallet at home. Pt calling for a lyft

## 2024-01-21 NOTE — Plan of Care (Signed)
 Brief Cardiology Communication:  Saw patient after TEE/DCCV. He was doing and feeling well at that time. He reported he wanted to leave the hospital. I strongly recommended he stay until his discharge paperwork/orders were completed. I also strongly recommended that he needs to stay compliant with his eliquis  for a minimum of 30 days post-DCCV to reduce his risk of post-DCCV related CVA. He noted he would take his medications. We will plan to see him in clinic for longitudinal management.  Caron Poser, MD

## 2024-02-03 NOTE — Progress Notes (Unsigned)
 "     Electrophysiology Clinic Note    Date:  02/03/2024  Patient ID:  Montarius, Kitagawa 10/22/1965, MRN [id number] PCP:  Pcp, No  Cardiologist:  None     ***refresh  Discussed the use of AI scribe software for clinical note transcription with the patient, who gave verbal consent to proceed.   Patient Profile    Chief Complaint: ***  History of Present Illness: FELTON BUCZYNSKI is a 59 y.o. male with PMH notable for parox afib, HTN, depression, anxiety ETOH abuse; seen today for EP evaluation of AFib  Atrial fibrillation initially diagnosed 12/2023 after presenting to ER with left arm laceration he received after a fall due to alcohol  withdrawal.  He was found to be in A-fib with rates as high as 200 bpm he was given phenobarbital ,  IV Dilt followed by IV dilt gtt. rates did not improve so he was subsequently started on IV amiodarone  and IV heparin .  He was slowly titrated on beta-blocker and amiodarone  stopped, he had a drop in his platelets so heparin  was stopped.  He was weaned on p.o. Dilt and IV Dilt stopped. He continued to have elevated ventricular rates with borderline blood pressure on p.o. Dilt, metoprolol , and digoxin .  It was ultimately decided he would have a TEE guided cardioversion on 12/24, he subsequently left the same day AMA.  On follow-up today, *** AF burden, symptoms *** palpitations *** bleeding concerns    Since last being seen in our clinic the patient reports doing ***.  he denies chest pain, palpitations, dyspnea, PND, orthopnea, nausea, vomiting, dizziness, syncope, edema, weight gain, or early satiety.      Arrhythmia/Device History No specialty comments available.    ROS:  Please see the history of present illness. All other systems are reviewed and otherwise negative.    Physical Exam    VS:  There were no vitals taken for this visit. BMI: There is no height or weight on file to calculate BMI.           Wt Readings from Last 3 Encounters:   01/19/24 245 lb 2.4 oz (111.2 kg)  11/30/23 262 lb 12.6 oz (119.2 kg)  06/07/23 250 lb (113.4 kg)     ***  GEN- The patient is well appearing, alert and oriented x 3 today.   Lungs- Clear to ausculation bilaterally, normal work of breathing.  Heart- {Blank single:19197::Regular,Irregularly irregular} rate and rhythm, ***no murmurs, rubs or gallops Extremities- {EDEMA LEVEL:28147::No} peripheral edema, warm, dry  Studies Reviewed   Previous EP, cardiology notes.    EKG is ordered. Personal review of EKG from today shows:  ***        TEE, 01/20/2024  1. Left ventricular ejection fraction, by estimation, is 55 to 60%. The  left ventricle has normal function.   2. No left atrial/left atrial appendage thrombus was detected.   3. The mitral valve is normal in structure. Trivial mitral valve  regurgitation. No evidence of mitral stenosis.   4. The aortic valve is tricuspid. Aortic valve regurgitation is not  visualized. No aortic stenosis is present.   Conclusion(s)/Recommendation(s): No LA/LAA thrombus identified. Successful  cardioversion performed with restoration of normal sinus rhythm.   TTE, 01/14/2024  1. Left ventricular ejection fraction, by estimation, is 55 to 60%. The  left ventricle has normal function. The left ventricle has no regional  wall motion abnormalities. Left ventricular diastolic parameters are  indeterminate.   2. Right ventricular systolic function is normal.  The right ventricular  size is normal. Tricuspid regurgitation signal is inadequate for assessing  PA pressure.   3. The mitral valve is normal in structure. No evidence of mitral valve  regurgitation. No evidence of mitral stenosis.   4. The aortic valve is normal in structure. Aortic valve regurgitation is  not visualized. No aortic stenosis is present.   5. The inferior vena cava is normal in size with greater than 50%  respiratory variability, suggesting right atrial pressure of 3  mmHg.   6. Rhythm is atrial fibrillation    Assessment and Plan     #) parox Afib New diagnosis after presenting to ER for laceration S/p TEE/DCCV 12/24 ***   #) Hypercoag d/t *** afib CHA2DS2-VASc Score = at least 2 [CHF History: 0, HTN History: 1, Diabetes History: 0, Stroke History: 0, Vascular Disease History: 1, Age Score: 0, Gender Score: 0].  Therefore, the patient's annual risk of stroke is 2.2 %.     {Confirm score is correct.  If not, click here to update score.  REFRESH note.  :1}   Stroke ppx - 5mg  eliquis  BID, appropriately dosed No bleeding concerns    #) ***   {Are you ordering a CV Procedure (e.g. stress test, cath, DCCV, TEE, etc)?   Press F2        :[id number]}   Current medicines are reviewed at length with the patient today.   The patient {ACTIONS; HAS/DOES NOT HAVE:19233} concerns regarding his medicines.  The following changes were made today:  {NONE DEFAULTED:18576}  Labs/ tests ordered today include: *** No orders of the defined types were placed in this encounter.    Disposition: Follow up with {EPMDS:28135::EP Team} or EP APP {EPFOLLOW UP:28173}   Signed, Mcclellan Demarais, NP  02/03/2024  5:54 PM  Electrophysiology [facility] "

## 2024-02-04 ENCOUNTER — Ambulatory Visit: Payer: MEDICAID | Attending: Cardiology | Admitting: Cardiology

## 2024-02-04 DIAGNOSIS — I480 Paroxysmal atrial fibrillation: Secondary | ICD-10-CM

## 2024-02-04 DIAGNOSIS — I4891 Unspecified atrial fibrillation: Secondary | ICD-10-CM

## 2024-02-07 ENCOUNTER — Emergency Department (HOSPITAL_COMMUNITY)

## 2024-02-07 ENCOUNTER — Emergency Department (HOSPITAL_COMMUNITY)
Admission: EM | Admit: 2024-02-07 | Discharge: 2024-02-08 | Disposition: A | Attending: Emergency Medicine | Admitting: Emergency Medicine

## 2024-02-07 DIAGNOSIS — W19XXXA Unspecified fall, initial encounter: Secondary | ICD-10-CM

## 2024-02-07 DIAGNOSIS — W010XXA Fall on same level from slipping, tripping and stumbling without subsequent striking against object, initial encounter: Secondary | ICD-10-CM | POA: Insufficient documentation

## 2024-02-07 DIAGNOSIS — Y909 Presence of alcohol in blood, level not specified: Secondary | ICD-10-CM | POA: Insufficient documentation

## 2024-02-07 DIAGNOSIS — S0101XA Laceration without foreign body of scalp, initial encounter: Secondary | ICD-10-CM | POA: Insufficient documentation

## 2024-02-07 DIAGNOSIS — F10929 Alcohol use, unspecified with intoxication, unspecified: Secondary | ICD-10-CM | POA: Diagnosis not present

## 2024-02-07 LAB — I-STAT CHEM 8, ED
BUN: 7 mg/dL (ref 6–20)
Calcium, Ion: 0.87 mmol/L — CL (ref 1.15–1.40)
Chloride: 97 mmol/L — ABNORMAL LOW (ref 98–111)
Creatinine, Ser: 1.2 mg/dL (ref 0.61–1.24)
Glucose, Bld: 131 mg/dL — ABNORMAL HIGH (ref 70–99)
HCT: 50 % (ref 39.0–52.0)
Hemoglobin: 17 g/dL (ref 13.0–17.0)
Potassium: 4 mmol/L (ref 3.5–5.1)
Sodium: 134 mmol/L — ABNORMAL LOW (ref 135–145)
TCO2: 22 mmol/L (ref 22–32)

## 2024-02-07 LAB — COMPREHENSIVE METABOLIC PANEL WITH GFR
ALT: 44 U/L (ref 0–44)
AST: 54 U/L — ABNORMAL HIGH (ref 15–41)
Albumin: 4.8 g/dL (ref 3.5–5.0)
Alkaline Phosphatase: 77 U/L (ref 38–126)
Anion gap: 23 — ABNORMAL HIGH (ref 5–15)
BUN: 7 mg/dL (ref 6–20)
CO2: 20 mmol/L — ABNORMAL LOW (ref 22–32)
Calcium: 8.7 mg/dL — ABNORMAL LOW (ref 8.9–10.3)
Chloride: 91 mmol/L — ABNORMAL LOW (ref 98–111)
Creatinine, Ser: 0.74 mg/dL (ref 0.61–1.24)
GFR, Estimated: >60 mL/min
Glucose, Bld: 127 mg/dL — ABNORMAL HIGH (ref 70–99)
Potassium: 4.1 mmol/L (ref 3.5–5.1)
Sodium: 133 mmol/L — ABNORMAL LOW (ref 135–145)
Total Bilirubin: 0.7 mg/dL (ref 0.0–1.2)
Total Protein: 8.3 g/dL — ABNORMAL HIGH (ref 6.5–8.1)

## 2024-02-07 LAB — CBC
HCT: 47.4 % (ref 39.0–52.0)
Hemoglobin: 16.5 g/dL (ref 13.0–17.0)
MCH: 31.3 pg (ref 26.0–34.0)
MCHC: 34.8 g/dL (ref 30.0–36.0)
MCV: 89.9 fL (ref 80.0–100.0)
Platelets: UNDETERMINED K/uL (ref 150–400)
RBC: 5.27 MIL/uL (ref 4.22–5.81)
RDW: 13.2 % (ref 11.5–15.5)
WBC: 6.9 K/uL (ref 4.0–10.5)
nRBC: 0 % (ref 0.0–0.2)

## 2024-02-07 LAB — SAMPLE TO BLOOD BANK

## 2024-02-07 LAB — ETHANOL: Alcohol, Ethyl (B): 444 mg/dL

## 2024-02-07 MED ORDER — DROPERIDOL 2.5 MG/ML IJ SOLN
5.0000 mg | Freq: Once | INTRAMUSCULAR | Status: AC
  Administered 2024-02-07: 5 mg via INTRAVENOUS
  Filled 2024-02-07: qty 2

## 2024-02-07 MED ORDER — CALCIUM GLUCONATE-NACL 1-0.675 GM/50ML-% IV SOLN
1.0000 g | Freq: Once | INTRAVENOUS | Status: AC
  Administered 2024-02-07: 1000 mg via INTRAVENOUS
  Filled 2024-02-07: qty 50

## 2024-02-07 MED ORDER — LACTATED RINGERS IV BOLUS
1000.0000 mL | Freq: Once | INTRAVENOUS | Status: AC
  Administered 2024-02-07: 1000 mL via INTRAVENOUS

## 2024-02-07 MED ORDER — SODIUM CHLORIDE 0.9 % IV BOLUS
1000.0000 mL | Freq: Once | INTRAVENOUS | Status: AC
  Administered 2024-02-07: 1000 mL via INTRAVENOUS

## 2024-02-07 MED ORDER — LIDOCAINE-EPINEPHRINE (PF) 2 %-1:200000 IJ SOLN
10.0000 mL | Freq: Once | INTRAMUSCULAR | Status: AC
  Administered 2024-02-07: 10 mL
  Filled 2024-02-07: qty 20

## 2024-02-07 MED ORDER — MAGNESIUM SULFATE 4 GM/100ML IV SOLN
4.0000 g | Freq: Once | INTRAVENOUS | Status: AC
  Administered 2024-02-07: 4 g via INTRAVENOUS
  Filled 2024-02-07: qty 100

## 2024-02-07 NOTE — ED Notes (Signed)
 Pt is a fall risk, slip socks and fall alarm in place for safety

## 2024-02-07 NOTE — Progress Notes (Signed)
 Orthopedic Tech Progress Note Patient Details:  Bobby Williams [birth year] [id number]  Patient ID: Bobby Williams, male   DOB: [birth year], 59 y.o.   MRN: [id number] I attended trauma page. Chandra Dorn PARAS 02/07/2024, 10:28 PM

## 2024-02-07 NOTE — ED Notes (Signed)
 When you have time, Brennden Masten (family) [phone] would like an update on pt. Status. Thank you

## 2024-02-07 NOTE — ED Triage Notes (Signed)
 Pt BIB [facility] c/o fall from chair. Apparently pt drank 5 bottles of white wine, fell asleep in chair, and fell, hitting head on fireplace. This was unwitnessed. Alcohol  is ongoing problem for pt.  Pt alert and oriented, appears intoxicated. Large hematoma to back of head, currently wrapped and bleeding controlled. C-collar in place   Non complaint with medication regimen, unknown last time took Eliquis   #18RAC- No medications given by EMS.   158/96, 98%RA, hr 80, tempt 97.3

## 2024-02-07 NOTE — Discharge Instructions (Signed)
 You were seen today for concerns for a fall. While you were here we monitored your vitals, preformed a physical exam, and got labs, imaging, and closed your laceration. These were all reassuring and there is no indication for any further testing or intervention in the emergency department at this time.   Things to do:  - Follow up with your primary care provider within the next 1-2 weeks - Please refrain from drinking alcohol , it was likely the cause of your fall today - Please contact your PCP to help with abstinence - You do not need to get your sutures removed, they will fall out on their own  Return to the emergency department if you have any new or worsening symptoms including any recurrent falls, headaches, visual changes, difficulty ambulating, numbness tingling extremities, or if you have any other concerns.

## 2024-02-07 NOTE — ED Provider Notes (Signed)
 " [facility] AT [facility] Provider Note   HPI/ROS    History obtained from EMS.  Bobby Williams is a 59 y.o. male who presents for Fall, Alcohol  Problem, and Altered Mental Status and who  has a past medical history of Alcohol  abuse, Anxiety, Depression with anxiety, Hypertension, Pneumonia, and Thrombocytopenia.  Patient presents today after drinking 5 bottles of wine.  States that that is his normal daily drinking amount. EMS states that he drank all of that wine, fell asleep in a chair and then woke up after hitting his head on the fireplace or something metal symptoms on the couch.  When they got there there was multiple bottles of wine around the house and a small hematoma to the back of his head that they were able to get wrapped and bleeding controlled.  He was GCS 14 for them because he was intermittently following commands.  States he takes Eliquis , but denies taking it in the last 10 days.  He does not remember what caused him to fall.  MDM   I have reviewed the nursing documentation, vital signs, as well as the past medical history, surgical history, family history, and social history.  Initial Assessment:  Patient hemodynamically stable on initial evaluation.  Does have hematoma and possible laceration to the posterior aspect of the scalp with bleeding controlled at this time.  Very intoxicated.  Unclear when he took his last Eliquis .  Do not see it in the chart that he was ever on it.  Made level 2 given concern for possible bleed on thinners.  Will obtain CT head, CT C-spine, chest and pelvis x-rays.  Currently no obvious focal neurologic deficit on exam, just appears intoxicated.  Patient given 5 of droperidol  to facilitate trauma workup given level of intoxication.   Trauma imaging unremarkable with no evidence of intracranial hemorrhage or skull fracture.  C-spine imaging unremarkable as well.  No obvious focal airspace disease, subdiaphragmatic air,  or pneumothorax on chest x-ray.  Pelvis x-ray without symphyseal widening or vertical shear fracture.  Basic labs here with no significant leukocytosis, anemia, or thrombocytopenia.  Metabolic panel here with mild hyponatremia, hypochloremia and mild hypocalcemia.  Feel that the anion gap of 21 is likely secondary to low chloride.  Will give an additional liter of NS.  Ethanol 444.  Patient is already on 1 L of LR.  Will also give a gram of calcium  gluconate.  After further washing the scalp patient has a very small 2 cm laceration.  Closed with 1 horizontal mattress suture.  It is absorbable so will not need removed.  Patient given magnesium  at this time given concern for starvation.  Had prolonged discussion with the patient regarding drinking.  Told him that this will likely continue to happen and he will continue to follow if he does not stop drinking.  Currently not interested in resources at this time.  Will place on CIWA while he is metabolizing.  Disposition:  Patient handed off to oncoming team.  Will be stable for discharge pending time to metabolize.   This patient was staffed with Dr. Jakie who supervised the visit and agreed with the plan of care.   Due to the patients current presenting symptoms, physical exam findings, and the workup stated above, it is thought that the etiology of the patients current presentation is:  1. Fall, initial encounter   2. Alcoholic intoxication with complication     Clinical Complexity A medically appropriate history,  review of systems, and physical exam was performed.  Factors that affect the complexity of this encounter: assessment of correct protocol and laboratory work from this visit  My independent interpretations of diagnostic studies are documented in the ED course above.   If decision rules were used in this patient's evaluation, they are listed below.   Click here for ABCD2, HEART and other calculators  Patient's presentation is  most consistent with acute presentation with potential threat to life or bodily function.  MDM generated using voice dictation software and may contain dictation errors. Please contact me for any clarification or with any questions.    Physical Exam, PMH, PSH, Family History, and Social Hsitory   Vitals:   02/07/24 1951 02/07/24 1952 02/07/24 1954 02/07/24 2117  BP: (!) 160/100 (!) 147/123  (!) 144/97  Pulse: (!) 130 (!) 135  (!) 126  Resp: (!) 22 15  20   Temp: 98.1 F (36.7 C)     TempSrc: Oral     SpO2: 100% 100%  96%  Weight:   111.1 kg   Height:   6' 4 (1.93 m)     Physical Exam Constitutional:      Comments: Intoxicated  HENT:     Head: Normocephalic.     Comments: Hematoma with dried blood over posterior scalp    Mouth/Throat:     Mouth: Mucous membranes are moist.     Pharynx: Oropharynx is clear.  Eyes:     Extraocular Movements: Extraocular movements intact.     Pupils: Pupils are equal, round, and reactive to light.  Cardiovascular:     Rate and Rhythm: Regular rhythm. Tachycardia present.  Pulmonary:     Effort: Pulmonary effort is normal.     Breath sounds: No wheezing, rhonchi or rales.  Abdominal:     General: Abdomen is flat. There is no distension.     Palpations: Abdomen is soft.     Tenderness: There is no guarding or rebound.  Musculoskeletal:     Cervical back: Normal range of motion. No rigidity.     Right lower leg: No edema.     Left lower leg: No edema.  Skin:    General: Skin is warm and dry.     Capillary Refill: Capillary refill takes less than 2 seconds.  Neurological:     General: No focal deficit present.     Mental Status: He is alert and oriented to person, place, and time.     Past Medical History:  Diagnosis Date   Alcohol  abuse    Anxiety    Depression with anxiety    Hypertension    Pneumonia    Thrombocytopenia      Past Surgical History:  Procedure Laterality Date   CARDIOVERSION N/A 01/20/2024   Procedure:  CARDIOVERSION;  Surgeon: Argentina Clap, MD;  Location: [facility] ORS;  Service: Cardiovascular;  Laterality: N/A;   NO PAST SURGERIES     TEE WITHOUT CARDIOVERSION N/A 01/20/2024   Procedure: ECHOCARDIOGRAM, TRANSESOPHAGEAL;  Surgeon: Argentina Clap, MD;  Location: [facility] ORS;  Service: Cardiovascular;  Laterality: N/A;     Family History  Problem Relation Age of Onset   Heart disease Mother    Kidney cancer Father    Diabetes Neg Hx     Social History   Tobacco Use   Smoking status: Never   Smokeless tobacco: Former    Types: Chew    Quit date: 08/27/2016   Tobacco comments:    nicotine  Lozenges 4mg  4-6  times  Substance Use Topics   Alcohol  use: Yes    Alcohol /week: 28.0 standard drinks of alcohol     Types: 28 Glasses of wine per week    Comment: 4-5 bottles daily     Procedures   If procedures were preformed on this patient, they are listed below:  .Laceration Repair  Date/Time: 02/07/2024 10:35 PM  Performed by: Williams Hamilton, MD Authorized by: Bobby Lamar BROCKS, MD   Consent:    Consent obtained:  Verbal   Consent given by:  Patient   Risks, benefits, and alternatives were discussed: yes     Risks discussed:  Infection, need for additional repair and nerve damage   Alternatives discussed:  No treatment Anesthesia:    Anesthesia method:  None Laceration details:    Location:  Scalp   Scalp location:  R parietal   Length (cm):  2   Depth (mm):  2 Exploration:    Wound extent: areolar tissue not violated, fascia not violated, no foreign body and no signs of injury     Contaminated: no   Treatment:    Area cleansed with:  Saline   Amount of cleaning:  Standard   Irrigation solution:  Sterile saline   Irrigation volume:  400cc   Irrigation method:  Pressure wash Skin repair:    Repair method:  Sutures   Suture size:  3-0   Wound skin closure material used: monocryrl.   Suture technique:  Horizontal mattress   Number of sutures:  1 Approximation:     Approximation:  Close Repair type:    Repair type:  Simple Post-procedure details:    Dressing:  Open (no dressing)   Procedure completion:  Tolerated well, no immediate complications    Electronically signed by:   Bobby Williams, M.D. PGY-2, Emergency Medicine   Please note that this documentation was produced with the assistance of voice-to-text technology and may contain errors.    Williams Hamilton, MD 02/07/24 [id number]  "

## 2024-02-08 NOTE — ED Provider Notes (Signed)
 I assumed care of this patient from previous provider.  Please see their note for further details of history, exam, and MDM.   Briefly patient is a 59 y.o. male who presented fall and ETOH intoxication pending MTF.  1:40 AM Clinically sober and able to ambulate w/o complication  The patient appears reasonably screened and/or stabilized for discharge and I doubt any other medical condition or other [facility] requiring further screening, evaluation, or treatment in the ED at this time. I have discussed the findings, Dx and Tx plan with the patient/family who expressed understanding and agree(s) with the plan. Discharge instructions discussed at length. The patient/family was given strict return precautions who verbalized understanding of the instructions. No further questions at time of discharge.  Disposition: Discharge  Condition: Good  ED Discharge Orders     None         Follow Up: [facility] Address: [address], [place], [place] [address] Hours: Open 24 hours Monday through Sunday Phone: [phone] Go to  as needed          Trine Raynell Moder, MD 02/08/24 0140

## 2024-02-08 NOTE — ED Notes (Signed)
 Patient ambulated to bathroom without assistance.
